# Patient Record
Sex: Female | Born: 1955 | Race: White | Hispanic: No | Marital: Married | Smoking: Never smoker
Health system: Southern US, Community
[De-identification: ages and names within clinical notes are randomized; demographics above are authoritative.]

## PROBLEM LIST (undated history)

## (undated) ENCOUNTER — Ambulatory Visit

## (undated) DIAGNOSIS — C801 Malignant (primary) neoplasm, unspecified: Secondary | ICD-10-CM

## (undated) DIAGNOSIS — E785 Hyperlipidemia, unspecified: Secondary | ICD-10-CM

## (undated) DIAGNOSIS — K5909 Other constipation: Secondary | ICD-10-CM

## (undated) DIAGNOSIS — O24419 Gestational diabetes mellitus in pregnancy, unspecified control: Secondary | ICD-10-CM

## (undated) DIAGNOSIS — J329 Chronic sinusitis, unspecified: Secondary | ICD-10-CM

## (undated) DIAGNOSIS — K219 Gastro-esophageal reflux disease without esophagitis: Secondary | ICD-10-CM

## (undated) DIAGNOSIS — R0789 Other chest pain: Secondary | ICD-10-CM

## (undated) DIAGNOSIS — K921 Melena: Secondary | ICD-10-CM

## (undated) DIAGNOSIS — I10 Essential (primary) hypertension: Secondary | ICD-10-CM

## (undated) DIAGNOSIS — E669 Obesity, unspecified: Secondary | ICD-10-CM

## (undated) DIAGNOSIS — J309 Allergic rhinitis, unspecified: Secondary | ICD-10-CM

## (undated) DIAGNOSIS — F329 Major depressive disorder, single episode, unspecified: Secondary | ICD-10-CM

## (undated) DIAGNOSIS — U071 COVID-19: Secondary | ICD-10-CM

## (undated) DIAGNOSIS — C4491 Basal cell carcinoma of skin, unspecified: Secondary | ICD-10-CM

## (undated) HISTORY — DX: Obesity, unspecified: E66.9

## (undated) HISTORY — PX: BREAST SURGERY: SHX581

## (undated) HISTORY — DX: Gastro-esophageal reflux disease without esophagitis: K21.9

## (undated) HISTORY — DX: COVID-19: U07.1

## (undated) HISTORY — PX: NEURECTOMY FOOT: SUR888

## (undated) HISTORY — PX: BUNIONECTOMY: SHX129

## (undated) HISTORY — DX: Basal cell carcinoma of skin, unspecified: C44.91

## (undated) HISTORY — DX: Melena: K92.1

## (undated) HISTORY — DX: Chronic sinusitis, unspecified: J32.9

## (undated) HISTORY — DX: Malignant (primary) neoplasm, unspecified: C80.1

## (undated) HISTORY — DX: Essential (primary) hypertension: I10

## (undated) HISTORY — DX: Other chest pain: R07.89

## (undated) HISTORY — DX: Other constipation: K59.09

## (undated) HISTORY — DX: Major depressive disorder, single episode, unspecified: F32.9

## (undated) HISTORY — DX: Allergic rhinitis, unspecified: J30.9

## (undated) HISTORY — PX: TUBAL LIGATION: SHX77

## (undated) HISTORY — DX: Hyperlipidemia, unspecified: E78.5

## (undated) HISTORY — PX: METATARSAL OSTEOTOMY: SHX1641

## (undated) HISTORY — DX: Gestational diabetes mellitus in pregnancy, unspecified control: O24.419

---

## 1994-02-19 HISTORY — PX: CYSTECTOMY: SUR359

## 2007-01-04 ENCOUNTER — Inpatient Hospital Stay (HOSPITAL_COMMUNITY): Admission: EM | Admit: 2007-01-04 | Discharge: 2007-01-07 | Payer: Self-pay | Admitting: Emergency Medicine

## 2007-01-04 ENCOUNTER — Encounter: Payer: Self-pay | Admitting: Family Medicine

## 2007-01-05 ENCOUNTER — Encounter (INDEPENDENT_AMBULATORY_CARE_PROVIDER_SITE_OTHER): Payer: Self-pay | Admitting: Internal Medicine

## 2007-08-15 ENCOUNTER — Encounter: Admission: RE | Admit: 2007-08-15 | Discharge: 2007-08-15 | Payer: Self-pay | Admitting: Family Medicine

## 2007-08-15 ENCOUNTER — Encounter: Payer: Self-pay | Admitting: Family Medicine

## 2008-05-10 ENCOUNTER — Encounter: Payer: Self-pay | Admitting: Family Medicine

## 2008-08-03 ENCOUNTER — Ambulatory Visit: Payer: Self-pay | Admitting: Family Medicine

## 2008-08-03 DIAGNOSIS — J309 Allergic rhinitis, unspecified: Secondary | ICD-10-CM | POA: Insufficient documentation

## 2008-08-03 DIAGNOSIS — I10 Essential (primary) hypertension: Secondary | ICD-10-CM

## 2008-08-03 DIAGNOSIS — K219 Gastro-esophageal reflux disease without esophagitis: Secondary | ICD-10-CM

## 2008-08-03 DIAGNOSIS — E785 Hyperlipidemia, unspecified: Secondary | ICD-10-CM | POA: Insufficient documentation

## 2008-08-03 HISTORY — DX: Allergic rhinitis, unspecified: J30.9

## 2008-08-03 HISTORY — DX: Essential (primary) hypertension: I10

## 2008-08-03 HISTORY — DX: Hyperlipidemia, unspecified: E78.5

## 2008-08-03 HISTORY — DX: Gastro-esophageal reflux disease without esophagitis: K21.9

## 2008-08-04 ENCOUNTER — Encounter: Payer: Self-pay | Admitting: Family Medicine

## 2008-10-12 ENCOUNTER — Ambulatory Visit: Payer: Self-pay | Admitting: Family Medicine

## 2008-10-12 DIAGNOSIS — R079 Chest pain, unspecified: Secondary | ICD-10-CM | POA: Insufficient documentation

## 2008-10-12 DIAGNOSIS — R0789 Other chest pain: Secondary | ICD-10-CM

## 2008-10-12 HISTORY — DX: Other chest pain: R07.89

## 2009-02-24 ENCOUNTER — Ambulatory Visit: Payer: Self-pay | Admitting: Family Medicine

## 2009-02-24 DIAGNOSIS — J329 Chronic sinusitis, unspecified: Secondary | ICD-10-CM | POA: Insufficient documentation

## 2009-02-24 HISTORY — DX: Chronic sinusitis, unspecified: J32.9

## 2009-03-16 ENCOUNTER — Telehealth: Payer: Self-pay | Admitting: Family Medicine

## 2009-05-02 ENCOUNTER — Telehealth: Payer: Self-pay | Admitting: Family Medicine

## 2009-05-02 DIAGNOSIS — K921 Melena: Secondary | ICD-10-CM

## 2009-05-02 HISTORY — DX: Melena: K92.1

## 2009-05-03 ENCOUNTER — Encounter (INDEPENDENT_AMBULATORY_CARE_PROVIDER_SITE_OTHER): Payer: Self-pay | Admitting: *Deleted

## 2009-07-25 ENCOUNTER — Telehealth: Payer: Self-pay | Admitting: Family Medicine

## 2009-07-28 ENCOUNTER — Ambulatory Visit: Payer: Self-pay | Admitting: Family Medicine

## 2009-07-29 LAB — CONVERTED CEMR LAB
AST: 14 units/L (ref 0–37)
Albumin: 4 g/dL (ref 3.5–5.2)
BUN: 13 mg/dL (ref 6–23)
Basophils Absolute: 0.1 10*3/uL (ref 0.0–0.1)
Basophils Relative: 1.4 % (ref 0.0–3.0)
Bilirubin, Direct: 0.1 mg/dL (ref 0.0–0.3)
Calcium: 9.7 mg/dL (ref 8.4–10.5)
Chloride: 107 meq/L (ref 96–112)
Creatinine, Ser: 1.1 mg/dL (ref 0.4–1.2)
Eosinophils Relative: 4.2 % (ref 0.0–5.0)
GFR calc non Af Amer: 55.95 mL/min (ref >60)
Glucose, Bld: 88 mg/dL (ref 70–99)
HCT: 40.9 % (ref 36.0–46.0)
Lymphs Abs: 1.7 10*3/uL (ref 0.7–4.0)
MCHC: 34.1 g/dL (ref 30.0–36.0)
Monocytes Absolute: 0.5 10*3/uL (ref 0.1–1.0)
Neutrophils Relative %: 53.2 % (ref 43.0–77.0)
RBC: 4.6 M/uL (ref 3.87–5.11)
RDW: 13.2 % (ref 11.5–14.6)
TSH: 2.48 microintl units/mL (ref 0.35–5.50)
Total Bilirubin: 1 mg/dL (ref 0.3–1.2)
Total CHOL/HDL Ratio: 5
Total Protein: 6.7 g/dL (ref 6.0–8.3)

## 2009-08-09 ENCOUNTER — Ambulatory Visit: Payer: Self-pay | Admitting: Family Medicine

## 2009-08-09 DIAGNOSIS — B084 Enteroviral vesicular stomatitis with exanthem: Secondary | ICD-10-CM | POA: Insufficient documentation

## 2009-12-08 ENCOUNTER — Other Ambulatory Visit: Admission: RE | Admit: 2009-12-08 | Discharge: 2009-12-08 | Payer: Self-pay | Admitting: Family Medicine

## 2009-12-08 ENCOUNTER — Ambulatory Visit: Payer: Self-pay | Admitting: Family Medicine

## 2009-12-08 DIAGNOSIS — K5909 Other constipation: Secondary | ICD-10-CM | POA: Insufficient documentation

## 2009-12-08 DIAGNOSIS — F3289 Other specified depressive episodes: Secondary | ICD-10-CM

## 2009-12-08 DIAGNOSIS — F329 Major depressive disorder, single episode, unspecified: Secondary | ICD-10-CM | POA: Insufficient documentation

## 2009-12-08 HISTORY — DX: Other constipation: K59.09

## 2009-12-08 HISTORY — DX: Major depressive disorder, single episode, unspecified: F32.9

## 2009-12-08 HISTORY — DX: Other specified depressive episodes: F32.89

## 2009-12-08 LAB — HM PAP SMEAR

## 2009-12-13 ENCOUNTER — Encounter: Payer: Self-pay | Admitting: Family Medicine

## 2010-02-10 ENCOUNTER — Ambulatory Visit: Payer: Self-pay | Admitting: Family Medicine

## 2010-03-12 ENCOUNTER — Encounter: Payer: Self-pay | Admitting: Family Medicine

## 2010-03-19 LAB — CONVERTED CEMR LAB: Pap Smear: NEGATIVE

## 2010-03-23 NOTE — Assessment & Plan Note (Signed)
Summary: PAP ONLY/PT STATED SHE DID NOT HER PAP IN 07-2009/NJR/pt rescd...   Vital Signs:  Patient profile:   55 year old female Menstrual status:  postmenopausal Weight:      154 pounds Temp:     98.3 degrees F oral BP sitting:   120 / 90  (left arm) Cuff size:   regular  Vitals Entered By: Sid Falcon LPN (December 08, 2009 3:00 PM)  History of Present Illness: patient here for the following several items.  She needs Pap smear. Remote history of abnormal Pap smear several years ago. Scheduled for mammogram later today.  She had planned on seeing gyn but has changed her mind.  History of depression which is stable on sertraline and needs refills. She has had some increased anxiety issues recently and very poor sleep. Previously on low-dose alprazolam and requesting refills which she takes very sporadically.  History of GERD controlled with omeprazole. Needs refills.  Frequent constipation for several months. Increased fiber intake ,water consumption,  and stool softener without much improvement. Does respond to laxatives. She had several years of constipation. No prior colonoscopy screening. She is hesitant because of cost issues.  thyroid normal last June.    Allergies: 1)  ! Sulfadiazine (Sulfadiazine) 2)  Pravachol (Pravastatin Sodium)  Past History:  Past Medical History: Last updated: 07/28/2009 Melanoma 2007 thigh GERD HypertensionMigraines, UTI Hyperlipidemia Allergic rhinitis  Past Surgical History: Last updated: 08/03/2008 Breast biopsy, 1985 Blood vessel leak in head 2008  Family History: Last updated: 08/03/2008 Family History High cholesterol, parent, grandparent Family History Hypertension, parent, grandparent family history stroke, grandparent family history diabetes, grandparent  Social History: Last updated: 08/03/2008 Occupation:  self employed Systems analyst course owner Married Never Smoked Alcohol use-no  Risk Factors: Smoking Status: never  (08/03/2008) PMH-FH-SH reviewed for relevance  Review of Systems  The patient denies anorexia, fever, weight loss, chest pain, syncope, dyspnea on exertion, peripheral edema, prolonged cough, headaches, hemoptysis, melena, hematochezia, severe indigestion/heartburn, and depression.    Physical Exam  General:  Well-developed,well-nourished,in no acute distress; alert,appropriate and cooperative throughout examination Ears:  External ear exam shows no significant lesions or deformities.  Otoscopic examination reveals clear canals, tympanic membranes are intact bilaterally without bulging, retraction, inflammation or discharge. Hearing is grossly normal bilaterally. Mouth:  Oral mucosa and oropharynx without lesions or exudates.  Teeth in good repair. Neck:  No deformities, masses, or tenderness noted. Breasts:  No mass, nodules, thickening, tenderness, bulging, retraction, inflamation, nipple discharge or skin changes noted.   Lungs:  Normal respiratory effort, chest expands symmetrically. Lungs are clear to auscultation, no crackles or wheezes. Heart:  normal rate and regular rhythm.   Abdomen:  soft and non-tender.   Genitalia:  Normal introitus for age, no external lesions, no vaginal discharge, mucosa pink and moist, no vaginal or cervical lesions, no vaginal atrophy, no friaility or hemorrhage, normal uterus size and position, no adnexal masses or tenderness Cervical Nodes:  No lymphadenopathy noted Psych:  normally interactive, good eye contact, not anxious appearing, and not depressed appearing.     Impression & Recommendations:  Problem # 1:  Preventive Health Care (ICD-V70.0) pap smear obtained.  mammogram later today.  hemoccults given.  She is reluctant to schedule colonoscopy at this time.  Problem # 2:  CONSTIPATION, CHRONIC (ICD-564.09) discussed lifestyle measures. She has continued to have difficulties for many years. She will try Amitiza 8 micrograms two times a day and  reviewed possible side effects.  Problem # 3:  GERD (ICD-530.81)  Her updated medication list for this problem includes:    Omeprazole 20 Mg Cpdr (Omeprazole) ..... One by mouth as needed  Problem # 4:  INSOMNIA, TRANSIENT (ICD-780.52) sleep hygiene discussed.  Low dose alprazolam for as needed use.  Problem # 5:  DEPRESSION (ICD-311)  Her updated medication list for this problem includes:    Zoloft 50 Mg Tabs (Sertraline hcl) ..... Once daily    Alprazolam 0.25 Mg Tabs (Alprazolam) ..... One by mouth at bedtime as needed  Complete Medication List: 1)  Zoloft 50 Mg Tabs (Sertraline hcl) .... Once daily 2)  Lisinopril 10 Mg Tabs (Lisinopril) .... Once daily 3)  Calcium Plus Vitamin D 300-100 Mg-unit Caps (Calcium carbonate-vitamin d) .... Once daily 4)  Rhinocort Aqua 32 Mcg/act Susp (Budesonide) .... One spray per nostil as needed 5)  Omeprazole 20 Mg Cpdr (Omeprazole) .... One by mouth as needed 6)  Fluconazole 150 Mg Tabs (Fluconazole) .... One tab by mouth as needed 7)  Diuretic Tabs (Buchu-cornsilk-ch grass-hydran) .... Every other day 8)  Vitamin B-12 100 Mcg Tabs (Cyanocobalamin) .... Once daily 9)  First-dukes Mouthwash Susp (Diphenhyd-hydrocort-nystatin) .... 5 cc by mouth 4 x daily prior to meals. swish and spit x 7 days 10)  Zovirax 400 Mg Tabs (Acyclovir) .Marland Kitchen.. 1 tab by mouth 5 x daily x 5 days 11)  Alprazolam 0.25 Mg Tabs (Alprazolam) .... One by mouth at bedtime as needed 12)  Amitiza 8 Mcg Caps (Lubiprostone) .... One by mouth two times a day  Other Orders: Pap Smear, Thin Prep ( Collection of) (G6440)  Patient Instructions: 1)  Schedule a colonoscopy/ sigmoidoscopy to help detect colon cancer.  Prescriptions: AMITIZA 8 MCG CAPS (LUBIPROSTONE) one by mouth two times a day  #60 x 1   Entered and Authorized by:   Evelena Peat MD   Signed by:   Evelena Peat MD on 12/08/2009   Method used:   Print then Give to Patient   RxID:   [id number] ALPRAZOLAM 0.25  MG TABS (ALPRAZOLAM) one by mouth at bedtime as needed  #30 x 1   Entered and Authorized by:   Evelena Peat MD   Signed by:   Evelena Peat MD on 12/08/2009   Method used:   Print then Give to Patient   RxID:   [id number] OMEPRAZOLE 20 MG CPDR (OMEPRAZOLE) one by mouth as needed  #30 x 11   Entered and Authorized by:   Evelena Peat MD   Signed by:   Evelena Peat MD on 12/08/2009   Method used:   Electronically to        [facility]  [address] (retail)       [address]       [place], [place]  [address]       Ph: [phone]       Fax: [phone]   RxID:   [id number] ZOLOFT 50 MG TABS (SERTRALINE HCL) once daily as needed  #30 x 11   Entered and Authorized by:   Evelena Peat MD   Signed by:   Evelena Peat MD on 12/08/2009   Method used:   Electronically to        [facility]  [address] (retail)       [address]       [place], [place]  [address]       Ph: [phone]  Fax: [phone]   RxID:   [id number]    Orders Added: 1)  Pap Smear, Thin Prep ( Collection of) [Q0091] 2)  Est. Patient Level IV [30865]

## 2010-03-23 NOTE — Letter (Signed)
Summary: Results Follow-up Letter  [place] [facility]  [address] [place], [place] [address]   Phone: [phone]  Fax: [phone]    12/13/2009  [address] [place], [place]  [address]  Dear Ms. Geng,   The following are the results of your recent test(s):  Test     Result     Pap Smear    Normal__X____  Not Normal_____       Comments: _________________________________________________________ Cholesterol LDL(Bad cholesterol):          Your goal is less than:         HDL (Good cholesterol):        Your goal is more than: _________________________________________________________ Other Tests:   _________________________________________________________  Please call for an appointment Or _________________________________________________________ _________________________________________________________ _________________________________________________________  Sincerely,  Evelena Peat, MD [place] [facility]

## 2010-03-23 NOTE — Assessment & Plan Note (Signed)
Summary: head congestion and sore throat/cjr---PT [facility] // RS   Vital Signs:  Patient profile:   55 year old female Menstrual status:  postmenopausal Weight:      159 pounds O2 Sat:      98 % Temp:     98.1 degrees F Pulse rate:   82 / minute BP sitting:   122 / 80  (left arm) Cuff size:   regular  Vitals Entered By: Pura Spice, RN (February 10, 2010 2:56 PM) CC: head congestion stuffy runny nose , URI symptoms   History of Present Illness:       This is a 55 year old woman who presents with URI symptoms.  one week hx of illness.  The patient complains of nasal congestion, clear nasal discharge, and productive cough, but denies sore throat.  Associated symptoms include low-grade fever (<100.5 degrees).  The patient also reports severe fatigue.  The patient denies headache and muscle aches.    Allergies: 1)  ! Sulfadiazine (Sulfadiazine) 2)  Pravachol (Pravastatin Sodium)  Review of Systems  The patient denies hoarseness, chest pain, syncope, dyspnea on exertion, peripheral edema, prolonged cough, and hemoptysis.    Physical Exam  General:  Well-developed,well-nourished,in no acute distress; alert,appropriate and cooperative throughout examination Ears:  External ear exam shows no significant lesions or deformities.  Otoscopic examination reveals clear canals, tympanic membranes are intact bilaterally without bulging, retraction, inflammation or discharge. Hearing is grossly normal bilaterally. Nose:  External nasal examination shows no deformity or inflammation. Nasal mucosa are pink and moist without lesions or exudates. Mouth:  Oral mucosa and oropharynx without lesions or exudates.  Teeth in good repair. Neck:  No deformities, masses, or tenderness noted. Lungs:  Normal respiratory effort, chest expands symmetrically. Lungs are clear to auscultation, no crackles or wheezes. Heart:  Normal rate and regular rhythm. S1 and S2 normal without gallop, murmur, click, rub or other  extra sounds.   Impression & Recommendations:  Problem # 1:  SINUSITIS, ACUTE (ICD-461.9)  Her updated medication list for this problem includes:    Rhinocort Aqua 32 Mcg/act Susp (Budesonide) ..... One spray per nostil as needed    Azithromycin 250 Mg Tabs (Azithromycin) .Marland Kitchen... 2 by mouth today then one by mouth once daily for 4 days  Complete Medication List: 1)  Zoloft 50 Mg Tabs (Sertraline hcl) .... Once daily 2)  Lisinopril 10 Mg Tabs (Lisinopril) .... Once daily 3)  Calcium Plus Vitamin D 300-100 Mg-unit Caps (Calcium carbonate-vitamin d) .... Once daily 4)  Rhinocort Aqua 32 Mcg/act Susp (Budesonide) .... One spray per nostil as needed 5)  Omeprazole 20 Mg Cpdr (Omeprazole) .... One by mouth as needed 6)  Fluconazole 150 Mg Tabs (Fluconazole) .... One tab by mouth as needed 7)  Diuretic Tabs (Buchu-cornsilk-ch grass-hydran) .... Every other day 8)  Vitamin B-12 100 Mcg Tabs (Cyanocobalamin) .... Once daily 9)  First-dukes Mouthwash Susp (Diphenhyd-hydrocort-nystatin) .... 5 cc by mouth 4 x daily prior to meals. swish and spit x 7 days 10)  Zovirax 400 Mg Tabs (Acyclovir) .Marland Kitchen.. 1 tab by mouth 5 x daily x 5 days 11)  Alprazolam 0.25 Mg Tabs (Alprazolam) .... One by mouth at bedtime as needed 12)  Amitiza 8 Mcg Caps (Lubiprostone) .... One by mouth two times a day 13)  Azithromycin 250 Mg Tabs (Azithromycin) .... 2 by mouth today then one by mouth once daily for 4 days  Patient Instructions: 1)  Acute sinusitis symptoms for less than 10 days are  not helped by antibiotics. Use warm moist compresses, and over the counter decongestants( only as directed). Call if no improvement in 5-7 days, sooner if increasing pain, fever, or new symptoms.  Prescriptions: AZITHROMYCIN 250 MG TABS (AZITHROMYCIN) 2 by mouth today then one by mouth once daily for 4 days  #6 x 0   Entered and Authorized by:   Evelena Peat MD   Signed by:   Evelena Peat MD on 02/10/2010   Method used:    Electronically to        [facility]  [address] (retail)       [address]       [place], [place]  [address]       Ph: [phone]       Fax: [phone]   RxID:   [id number]    Orders Added: 1)  Est. Patient Level III [32951]

## 2010-03-23 NOTE — Progress Notes (Signed)
Summary: needs rx for yeast, diflucan Rx  Phone Note Call from Patient Call back at Home Phone [phone]   Caller: Patient-live call Summary of Call: pt has a yeast infection coming from the Abx that She was put on 2 weeks ago. Please call in something to [facility] in [place]. Initial call taken by: Warnell Forester,  March 16, 2009 10:24 AM  Follow-up for Phone Call        OK to call in diflucan 150 mg by mouth times one dose. Follow-up by: Evelena Peat MD,  March 16, 2009 11:26 AM  Additional Follow-up for Phone Call Additional follow up Details #1::        Pt informed on personally identified VM. Additional Follow-up by: Sid Falcon LPN,  March 16, 2009 11:51 AM    New/Updated Medications: FLUCONAZOLE 150 MG TABS (FLUCONAZOLE) one tab by mouth one time Prescriptions: FLUCONAZOLE 150 MG TABS (FLUCONAZOLE) one tab by mouth one time  #1 x 0   Entered by:   Sid Falcon LPN   Authorized by:   Evelena Peat MD   Signed by:   Sid Falcon LPN on 86/57/8469   Method used:   Electronically to        [facility]  [address] (retail)       [address]       [place], [place]  [address]       Ph: [phone]       Fax: [phone]   RxID:   [id number]

## 2010-03-23 NOTE — Assessment & Plan Note (Signed)
Summary: SINUSES//CCM   Vital Signs:  Patient profile:   55 year old female Menstrual status:  postmenopausal Temp:     98.5 degrees F oral BP sitting:   120 / 80  (left arm) Cuff size:   regular  Vitals Entered By: Sid Falcon LPN (February 24, 2009 11:10 AM) CC: Sinusitis X 5-6 months   History of Present Illness: 5-6 months of left facial pain maxillary sinus region.  initially saw her dentist because of swelling left upper gum region but no dental abnormalities noted. She apparently had x-rays of her teeth which were unremarkable. Swelling left upper gum region seems to fluctuate somewhat. She has left facial pain and some chronic postnasal drip symptoms over the several months. No fever. Intermittent headache and malaise. No visible purulent drainage. No cough. Allergy to sulfa.  Allergies: 1)  ! Sulfadiazine (Sulfadiazine) 2)  Pravachol (Pravastatin Sodium)  Past History:  Past Medical History: Last updated: 08/03/2008 Cancer GERD HypertensionMigraines, UTI Hyperlipidemia Allergic rhinitis  Review of Systems      See HPI  Physical Exam  General:  Well-developed,well-nourished,in no acute distress; alert,appropriate and cooperative throughout examination Head:  Normocephalic and atraumatic without obvious abnormalities. No apparent alopecia or balding.tenderness of the left maxillary sinus region but no visible swelling Ears:  External ear exam shows no significant lesions or deformities.  Otoscopic examination reveals clear canals, tympanic membranes are intact bilaterally without bulging, retraction, inflammation or discharge. Hearing is grossly normal bilaterally. Nose:  no visible purulent drainage Mouth:  Oral mucosa and oropharynx without lesions or exudates.  Teeth in good repair. Neck:  No deformities, masses, or tenderness noted. Lungs:  Normal respiratory effort, chest expands symmetrically. Lungs are clear to auscultation, no crackles or wheezes. Heart:   Normal rate and regular rhythm. S1 and S2 normal without gallop, murmur, click, rub or other extra sounds.   Impression & Recommendations:  Problem # 1:  SINUSITIS, CHRONIC (ICD-473.9) 2 week course of Augmentin and limited CT of sinuses if no better in 2 weeks. Her updated medication list for this problem includes:    Rhinocort Aqua 32 Mcg/act Susp (Budesonide) ..... One spray per nostil once daily    Amoxicillin-pot Clavulanate 875-125 Mg Tabs (Amoxicillin-pot clavulanate) ..... One by mouth two times a day for 2 weeks.  Complete Medication List: 1)  Zoloft 50 Mg Tabs (Sertraline hcl) .... Once daily 2)  Lisinopril 10 Mg Tabs (Lisinopril) .... Once daily 3)  Calcium Plus Vitamin D 300-100 Mg-unit Caps (Calcium carbonate-vitamin d) .... Once daily 4)  Rhinocort Aqua 32 Mcg/act Susp (Budesonide) .... One spray per nostil once daily 5)  Omeprazole 20 Mg Cpdr (Omeprazole) .... One by mouth once daily 6)  Amoxicillin-pot Clavulanate 875-125 Mg Tabs (Amoxicillin-pot clavulanate) .... One by mouth two times a day for 2 weeks.  Patient Instructions: 1)  Drink lots of fluids. 2)  Call or touch base in 2 weeks if symptoms not improved. Prescriptions: AMOXICILLIN-POT CLAVULANATE 875-125 MG TABS (AMOXICILLIN-POT CLAVULANATE) one by mouth two times a day for 2 weeks.  #28 x 0   Entered and Authorized by:   Evelena Peat MD   Signed by:   Evelena Peat MD on 02/24/2009   Method used:   Electronically to        [facility]  [address] (retail)       [address]       [place], [place]  [address]  Ph: [phone]       Fax: [phone]   RxID:   [id number]

## 2010-03-23 NOTE — Progress Notes (Signed)
Summary: chole check  Phone Note Call from Patient Call back at Work Phone [phone]   Caller: Patient Call For: Evelena Peat MD Summary of Call: pt stated its time for chole check can I sch? Initial call taken by: Heron Sabins,  July 25, 2009 2:29 PM  Follow-up for Phone Call        [facility] to set up.  If pt is interested, set up CPE. Follow-up by: Evelena Peat MD,  July 25, 2009 5:27 PM  Additional Follow-up for Phone Call Additional follow up Details #1::        cpx 07-28-2009 9.30am fasting Additional Follow-up by: Heron Sabins,  July 26, 2009 10:57 AM

## 2010-03-23 NOTE — Assessment & Plan Note (Signed)
Summary: cpx/pt will come in fasting/ok per doc/njr   Vital Signs:  Patient profile:   55 year old female Menstrual status:  postmenopausal Height:      60.50 inches Weight:      154 pounds BMI:     29.69 Temp:     97.7 degrees F oral Pulse rate:   72 / minute Pulse rhythm:   regular Resp:     12 per minute BP sitting:   130 / 90  (left arm) Cuff size:   regular  Vitals Entered By: Sid Falcon LPN (July 29, 7827 9:33 AM)  Nutrition Counseling: Patient's BMI is greater than 25 and therefore counseled on weight management options. CC: CPX, pt fasting   History of Present Illness: Here for CPE.  No colonoscopy. Tetanus up to date. Has seen Dr Elana Alm in past for gyn exams.  Abnormal MRI head 2008 and we are trying to get records but she reports she had "blood vessel leak"  Sees gyn and plans to schedule soon. Hx melanoma and sees derm yearly for full body check. No regular exercise.  Allergies: 1)  ! Sulfadiazine (Sulfadiazine) 2)  Pravachol (Pravastatin Sodium)  Past History:  Past Surgical History: Last updated: 08/03/2008 Breast biopsy, 1985 Blood vessel leak in head 2008  Family History: Last updated: 08/03/2008 Family History High cholesterol, parent, grandparent Family History Hypertension, parent, grandparent family history stroke, grandparent family history diabetes, grandparent  Social History: Last updated: 08/03/2008 Occupation:  self employed Engineer, mining Married Never Smoked Alcohol use-no  Risk Factors: Smoking Status: never (08/03/2008)  Past Medical History: Melanoma 2007 thigh GERD HypertensionMigraines, UTI Hyperlipidemia Allergic rhinitis  Review of Systems  The patient denies anorexia, fever, weight loss, weight gain, vision loss, decreased hearing, hoarseness, chest pain, syncope, dyspnea on exertion, peripheral edema, prolonged cough, headaches, hemoptysis, abdominal pain, melena, hematochezia, severe  indigestion/heartburn, hematuria, incontinence, genital sores, muscle weakness, suspicious skin lesions, transient blindness, difficulty walking, depression, unusual weight change, abnormal bleeding, enlarged lymph nodes, and breast masses.         freq constipation which is chronic.  one episode of hematochezia 3/11 and none since.  Physical Exam  General:  Well-developed,well-nourished,in no acute distress; alert,appropriate and cooperative throughout examination Head:  Normocephalic and atraumatic without obvious abnormalities. No apparent alopecia or balding. Eyes:  No corneal or conjunctival inflammation noted. EOMI. Perrla. Funduscopic exam benign, without hemorrhages, exudates or papilledema. Vision grossly normal. Ears:  External ear exam shows no significant lesions or deformities.  Otoscopic examination reveals clear canals, tympanic membranes are intact bilaterally without bulging, retraction, inflammation or discharge. Hearing is grossly normal bilaterally. Mouth:  Oral mucosa and oropharynx without lesions or exudates.  Teeth in good repair. Neck:  No deformities, masses, or tenderness noted. Breasts:  per gyn Lungs:  Normal respiratory effort, chest expands symmetrically. Lungs are clear to auscultation, no crackles or wheezes. Heart:  Normal rate and regular rhythm. S1 and S2 normal without gallop, murmur, click, rub or other extra sounds. Abdomen:  Bowel sounds positive,abdomen soft and non-tender without masses, organomegaly or hernias noted. Genitalia:  per gyn Msk:  No deformity or scoliosis noted of thoracic or lumbar spine.   Extremities:  No clubbing, cyanosis, edema, or deformity noted with normal full range of motion of all joints.   Neurologic:  alert & oriented X3, cranial nerves II-XII intact, and strength normal in all extremities.   Skin:  multiple nevi, esp back no signif atypia. Cervical Nodes:  No lymphadenopathy noted  Psych:  Cognition and judgment appear  intact. Alert and cooperative with normal attention span and concentration. No apparent delusions, illusions, hallucinations   Impression & Recommendations:  Problem # 1:  Preventive Health Care (ICD-V70.0) needs colonoscopy and pt highly advised to schedule.  she has high co-pay and has been reluctant to scheduale in past.She will call when she is ready.  Given hemoccult cards for completion.  Check screening labs.    Complete Medication List: 1)  Zoloft 50 Mg Tabs (Sertraline hcl) .... Once daily as needed 2)  Lisinopril 10 Mg Tabs (Lisinopril) .... Once daily 3)  Calcium Plus Vitamin D 300-100 Mg-unit Caps (Calcium carbonate-vitamin d) .... Once daily 4)  Rhinocort Aqua 32 Mcg/act Susp (Budesonide) .... One spray per nostil as needed 5)  Omeprazole 20 Mg Cpdr (Omeprazole) .... One by mouth as needed 6)  Fluconazole 150 Mg Tabs (Fluconazole) .... One tab by mouth as needed 7)  Diuretic Tabs (Buchu-cornsilk-ch grass-hydran) .... Every other day 8)  Vitamin B-12 100 Mcg Tabs (Cyanocobalamin) .... Once daily  Other Orders: Venipuncture ([address]) TLB-Lipid Panel (80061-LIPID) TLB-BMP (Basic Metabolic Panel-BMET) (80048-METABOL) TLB-CBC Platelet - w/Differential (85025-CBCD) TLB-Hepatic/Liver Function Pnl (80076-HEPATIC) TLB-TSH (Thyroid Stimulating Hormone) (84443-TSH)  Patient Instructions: 1)  It is important that you exercise reguarly at least 20 minutes 5 times a week. If you develop chest pain, have severe difficulty breathing, or feel very tired, stop exercising immediately and seek medical attention.  2)  Schedule your mammogram.  3)  Schedule a colonoscopy/ sigmoidoscopy to help detect colon cancer.  4)  Take calcium +vitamin D daily.  Prescriptions: LISINOPRIL 10 MG TABS (LISINOPRIL) once daily  #90 x 3   Entered and Authorized by:   Evelena Peat MD   Signed by:   Evelena Peat MD on 07/28/2009   Method used:   Electronically to        [facility]  [address] (retail)        [address]       [place], [place]  [address]       Ph: [phone]       Fax: [phone]   RxID:   [id number]    Preventive Care Screening  Last Tetanus Booster:    Date:  07/24/2006    Results:  Tdap

## 2010-03-23 NOTE — Progress Notes (Signed)
Summary: COLON REFERRAL  Phone Note Call from Patient Call back at Work Phone [phone]   Caller: Patient Call For: Evelena Peat MD Summary of Call: PT WAS HAVE RECTAL BLEEDING YESTERDAY, PT WOULD LIKE A REFERRAL FOR COLONOSCOPY, SHE IS REQUESTING THUR Initial call taken by: Heron Sabins,  May 02, 2009 10:38 AM  Follow-up for Phone Call        agree that she should have evaluated further and esp at her age as she has never had prior screening colonoscopy.  Will refer to GI. Follow-up by: Evelena Peat MD,  May 02, 2009 12:56 PM  Additional Follow-up for Phone Call Additional follow up Details #1::        Appt Scheduled. Pt aware. Additional Follow-up by: Corky Mull,  May 03, 2009 9:29 AM  New Problems: HEMATOCHEZIA (ICD-578.1)   New Problems: HEMATOCHEZIA (ICD-578.1)

## 2010-03-23 NOTE — Assessment & Plan Note (Signed)
Summary: mouth lesions/dm   Vital Signs:  Patient profile:   55 year old female Menstrual status:  postmenopausal Weight:      154 pounds O2 Sat:      91 % Temp:     98.3 degrees F oral Pulse rate:   76 / minute BP sitting:   112 / 90  (right arm) Cuff size:   regular  Vitals Entered By: Kathrynn Speed CMA (August 09, 2009 1:53 PM) CC: Mouth lesions staeted a week ago, mouth soreness, red gums, blisters in roof of mouth   History of Present Illness: Patient in today with 5 day history of oral lesions. She has had cold sores for years on her lips but never any lesions like this. Initially her tongue became sore and felt swollen along edges, then she developed blisters on the roof of her mouth and a sore throat. Over past 24 hours she has also noted red/swollen/painful gums. She denies f/c/cough/CP/palp/SOB/myalgias/GI c/o. She has a long history of nasal allergies and takes Zyrtec intermittently but these symptoms have not recently worsened. She has ongoing nasal congestion/pruritus and did have a couple of episodes of R sided epistaxis last week which were easily controlled and not unusual for her. 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  Current Medications (verified): 1)  Zoloft 50 Mg Tabs (Sertraline Hcl) .... Once Daily As Needed 2)  Lisinopril 10 Mg Tabs (Lisinopril) .... Once Daily 3)  Calcium Plus Vitamin D 300-100 Mg-Unit Caps (Calcium Carbonate-Vitamin D) .... Once Daily 4)  Rhinocort Aqua 32 Mcg/act Susp (Budesonide) .... One Spray Per Nostil As Needed 5)  Omeprazole 20 Mg Cpdr (Omeprazole) .... One By Mouth As Needed 6)  Fluconazole 150 Mg Tabs (Fluconazole) .... One Tab By Mouth As Needed 7)  Diuretic  Tabs (Buchu-Cornsilk-Ch Grass-Hydran) .... Every Other Day 8)  Vitamin B-12 100 Mcg Tabs (Cyanocobalamin) .... Once Daily  Allergies (verified): 1)  ! Sulfadiazine (Sulfadiazine) 2)  Pravachol (Pravastatin  Sodium)  Past History:  Past medical history reviewed for relevance to current acute and chronic problems. Social history (including risk factors) reviewed for relevance to current acute and chronic problems.  Past Medical History: Reviewed history from 07/28/2009 and no changes required. Melanoma 2007 thigh GERD HypertensionMigraines, UTI Hyperlipidemia Allergic rhinitis  Social History: Reviewed history from 08/03/2008 and no changes required. Occupation:  self employed Engineer, mining Married Never Smoked Alcohol use-no  Review of Systems      See HPI  Physical Exam  General:  Well-developed,well-nourished,in no acute distress; alert,appropriate and cooperative throughout examination Head:  Normocephalic and atraumatic without obvious abnormalities Eyes:  No corneal or conjunctival inflammation noted. EOMI. Perrla. Ears:  R TM sclerotic.   Nose:  mucosal erythema and mucosal edema.   Mouth:  pharyngeal erythema, gingival inflammation, and vesicle(s) on roof of mouth and in posterior oropharynx   Neck:  No deformities, masses, or tenderness noted. Lungs:  Normal respiratory effort, chest expands symmetrically. Lungs are clear to auscultation, no crackles or wheezes. Heart:  Normal rate and regular rhythm. S1 and S2 normal without gallop, murmur, click, rub or other extra sounds. Abdomen:  Bowel sounds positive,abdomen soft and no  masses, organomegaly or hernias noted.normal bowel sounds, no guarding, no rigidity, no rebound tenderness, epigastric tenderness, and LUQ mass.   Pulses:  R and L dorsalis pedis and posterior tibial pulses are full and equal bilaterally Extremities:  No clubbing, cyanosis, edema,  or deformity noted with normal full range of motion of all joints.   Neurologic:  No cranial nerve deficits noted. Station and gait are normal. Plantar reflexes are down-going bilaterally. DTRs are symmetrical throughout. Sensory, motor and coordinative functions appear  intact. Psych:  Cognition and judgment appear intact. Alert and cooperative with normal attention span and concentration. No apparent delusions, illusions, hallucinations   Impression & Recommendations:  Problem # 1:  HAND, FOOT, AND MOUTH DISEASE (ICD-074.3) Viral lesions in mouth suspicious for Coxsackie virus, does have a small grand daughter whom she cares for and picks up from daycare. Given Duke's Magic Mouthwash to use as needed for pain QID, swish and spit 5cc. Acyclovir 400mg  by mouth 5 x daily x 5days. Report if symptoms worsen or do not improve.  Problem # 2:  ALLERGIC RHINITIS (ICD-477.9)  Her updated medication list for this problem includes:    Rhinocort Aqua 32 Mcg/act Susp (Budesonide) ..... One spray per nostil as needed Consider taking Zyrtec daily during golf season  Problem # 3:  HYPERTENSION (ICD-401.9)  Her updated medication list for this problem includes:    Lisinopril 10 Mg Tabs (Lisinopril) ..... Once daily    Diuretic Tabs (Buchu-cornsilk-ch grass-hydran) ..... Every other day Well controlled on current meds.  Complete Medication List: 1)  Zoloft 50 Mg Tabs (Sertraline hcl) .... Once daily as needed 2)  Lisinopril 10 Mg Tabs (Lisinopril) .... Once daily 3)  Calcium Plus Vitamin D 300-100 Mg-unit Caps (Calcium carbonate-vitamin d) .... Once daily 4)  Rhinocort Aqua 32 Mcg/act Susp (Budesonide) .... One spray per nostil as needed 5)  Omeprazole 20 Mg Cpdr (Omeprazole) .... One by mouth as needed 6)  Fluconazole 150 Mg Tabs (Fluconazole) .... One tab by mouth as needed 7)  Diuretic Tabs (Buchu-cornsilk-ch grass-hydran) .... Every other day 8)  Vitamin B-12 100 Mcg Tabs (Cyanocobalamin) .... Once daily 9)  First-dukes Mouthwash Susp (Diphenhyd-hydrocort-nystatin) .... 5 cc by mouth 4 x daily prior to meals. swish and spit x 7 days 10)  Zovirax 400 Mg Tabs (Acyclovir) .Marland Kitchen.. 1 tab by mouth 5 x daily x 5 days  Patient Instructions: 1)  Please schedule a follow-up  appointment as needed if lesions worsen or do not resolve. Take Acyclovir til gone. Use Duke's Mouthwash as needed for pain prior to eating. Run toothbrushes thru the dishwasher or replace, now and after course of medications is complete Prescriptions: ZOVIRAX 400 MG TABS (ACYCLOVIR) 1 tab by mouth 5 x daily x 5 days  #25 x 0   Entered and Authorized by:   Danise Edge MD   Signed by:   Danise Edge MD on 08/09/2009   Method used:   Electronically to        [facility] [facility] (retail)       [address]       [place], [place]  [address]       Ph: [phone]       Fax: [phone]   RxID:   [id number] FIRST-DUKES MOUTHWASH  SUSP (DIPHENHYD-HYDROCORT-NYSTATIN) 5 cc by mouth 4 x daily prior to meals. Swish and spit x 7 days  #150 x 0   Entered and Authorized by:   Danise Edge MD   Signed by:   Danise Edge MD on 08/09/2009   Method used:   Electronically to        [facility]  [address] [facility] (retail)  [address]       [place], [place]  [address]       Ph: [phone]       Fax: [phone]   RxID:   [id number]

## 2010-03-23 NOTE — Letter (Signed)
Summary: New Patient letter  [facility]  [address] [place], [place] [address]   Phone: [phone]  Fax: [phone]       05/03/2009 MRN: [id number]  Debbie Werner [address] [place], [place]  [address]  Dear Ms. Stroud,  Welcome to the Gastroenterology Division at [facility].    You are scheduled to see Dr.  Leone Payor on 06-02-09 at 9am on the 3rd floor at [facility], [address]  We ask that you try to arrive at our office 15 minutes prior to your appointment time to allow for check-in.  We would like you to complete the enclosed self-administered evaluation form prior to your visit and bring it with you on the day of your appointment.  We will review it with you.  Also, please bring a complete list of all your medications or, if you prefer, bring the medication bottles and we will list them.  Please bring your insurance card so that we may make a copy of it.  If your insurance requires a referral to see a specialist, please bring your referral form from your primary care physician.  Co-payments are due at the time of your visit and may be paid by cash, check or credit card.     Your office visit will consist of a consult with your physician (includes a physical exam), any laboratory testing he/she may order, scheduling of any necessary diagnostic testing (e.g. x-ray, ultrasound, CT-scan), and scheduling of a procedure (e.g. Endoscopy, Colonoscopy) if required.  Please allow enough time on your schedule to allow for any/all of these possibilities.    If you cannot keep your appointment, please call [phone] to cancel or reschedule prior to your appointment date.  This allows [place] the opportunity to schedule an appointment for another patient in need of care.  If you do not cancel or reschedule by 5 p.m. the business day prior to your appointment date, you will be charged a $50.00 late cancellation/no-show fee.    Thank you for choosing [facility] for your medical needs.  We appreciate the opportunity to care for you.  Please visit [place] at our website  to learn more about our practice.                     Sincerely,                                                             The Gastroenterology Division

## 2010-06-26 ENCOUNTER — Other Ambulatory Visit: Payer: Self-pay | Admitting: Family Medicine

## 2010-06-26 NOTE — Telephone Encounter (Signed)
Ambien refill request sent electronically, last filled 12/08/09 #30 with 1 refill, may take one at HS as needed

## 2010-06-26 NOTE — Telephone Encounter (Signed)
OK to refill for 6 months 

## 2010-07-04 NOTE — Discharge Summary (Signed)
Debbie Werner, Debbie Werner               ACCOUNT NO.:  [id number]   MEDICAL RECORD NO.:  [id number]          PATIENT TYPE:  INP   LOCATION:  3728                         FACILITY:  [facility]   PHYSICIAN:  Altha Harm, MDDATE OF BIRTH:  1955-03-30   DATE OF ADMISSION:  01/04/2007  DATE OF DISCHARGE:  01/07/2007                               DISCHARGE SUMMARY   DISCHARGE DISPOSITION:  Home.   FINAL DISCHARGE DIAGNOSES:  1. Hypertensive crisis, resolved.  2. Hypertension.  3. Hyperlipidemia, new diagnosis.  4. Elevated thyroid-stimulating hormone, diagnosis unclear.  5. Intractable headaches, resolved.  6. Intractable vomiting, resolved.  7. Positive blood culture, gram-positive cocci, felt to be a      contaminant, need to be followed up by primary care physician.   DISCHARGE MEDICATIONS:  1. Labetalol 100 mg p.o. b.i.d.  2. Zocor 40 mg p.o. q.h.s.  3. Fioricet 1 tablet p.o. q.4h. p.r.n.   CONSULTANTS:  Dr. Nash Shearer, neurology.   PROCEDURES:  None.   DIAGNOSTIC STUDIES:  1. MRI of the brain with and without contrast done on admission on      January 04, 2007 which shows that the radiodense structure noted      in the right temporal lobe on the outside CT from [facility]      Radiology appears to represent a benign calcification.  There is no      evidence of vascular malformation, infection, calcified neoplasm,      or acute hemorrhage.  No acute stroke or abnormal intracranial      enhancement.  Two small calvarial lesions as described in the right      parietal and left frontal bones with P1 shortening pre-imposed      contrast correlating with small lucencies on the CT.  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  2. Portable chest x-ray done on January 04, 2007.  This shows no      evidence of acute cardiopulmonary disease.   PERTINENT LABORATORY  STUDIES:  1. Blood culture which shows gram-positive cocci in 1 out of 2 bottles      (given the patient's clinical course, it is felt that this is a      contaminant).  That needs to be followed by her primary care      physician for final results.  2. Mildly elevated TSH at 5.8 (the patient has T3 and free T4 within      the normal range).  Recommend rechecking TSH when the patient is in      a convalescent stage.   CHIEF COMPLAINT:  Near syncope and headache and vomiting.   HISTORY OF PRESENT ILLNESS:  Please see H&P dictated by Dr. Ashley Royalty for  details of the HPI.  However, briefly, this is a patient who had a near  syncopal episode on Friday morning.  She was seen by her primary care  physician at their office and sent to have  a radiological study done.  The CT of the head indicated a possibility of an intracranial  hemorrhage, and the patient was sent to the emergency room for a  followup MRI to confirm.  The MRI results are as noted above.  While in  the emergency room, the patient was noted to have an excruciating  headache and intractable vomiting.  She was also noted to have a  markedly elevated blood pressure.  Neurology was consulted for the  possibility of intracranial bleed and the headaches, and we were asked  to admit the patient for the hypertensive urgency.   HOSPITAL COURSE:  1. Hypertensive urgency.  The patient's blood pressure was elevated to      the 200s over 110s.  The patient was placed on a nitroglycerin drip      and given intermittent IV labetalol as she was unable to tolerate      p.o.  I felt that her blood pressure was multifactorial as a      combination of essential hypertension in conjunction with a high-      stress state due to her vomiting and headache.  The patient's      headache was treated, and once this was adequately treated the      patient actually had quite a nice resolution of her hypertensive      urgency.  The patient was placed on  labetalol 100 mg b.i.d.  Given      the patient's response to medications, it is quite possible that      the patient may be able to taper her medications and be treated      with a very small dose of antihypertensive medication.  I have      instructed the patient to check her blood pressure first thing in      the morning and at approximately 2 p.m. in the afternoon for an      entire week and to present these findings to her physician in the      ambulatory setting for further evaluation.  This patient should      also be watched for episodic spikes in high blood pressure which      may be an indication of a pheochromocytoma given the fact that the      patient had such an excruciating headache associated with it.      However, at this juncture, no studies have been done to evaluate      the pheochromocytoma.  2. Hyperlipidemia.  The patient was found to have profoundly elevated      cholesterols with her total cholesterol equaling 255, her HDL 39,      her LDL 190, and her triglycerides at 128.  The patient does have a      strong family history of hyperlipidemia with both her mother and      brother being on antilipidemic medications.  The patient has just      started on Zocor 40 mg.  I have spoken to the patient and the      family about the need to have her cholesterol panel rechecked in      approximately 4 weeks for further titration of the medication.      This should be done on the ambulatory setting.  3. Elevated TSH.  The patient had elevated TSH.  Initial TSH was      mildly elevated at 5.72.  A repeat TSH was 5.87 with free T4  within      the normal range of 1.49 and a T3 level in the normal range of 3.2.      Given the fact that the patient had such an intense intracranial      process which may have caused her to have a transient protective      hypothyroidism, I would recommend that the patient have these      studies repeated in the convalescence stage in the  ambulatory      setting, and I would, more than likely, just repeat a TSH.  It is      quite likely that the patient, even though her T3 and free T4 are      within the normal range, she could have a decrease in her usual set      point which would cause an increase in the TSH.  I would recommend      a TSH being repeated in approximately 2-3 weeks and therapy      instituted if necessary at that time, particularly in light of the      patient's marked hyperlipidemic state.  4. Headaches.  The patient's headaches resolved after she received      Decadron and remained in resolution after the Decadron was      discontinued.  The patient did not require any narcotics for the      resolution of her headache.  In contrast, the patient has received      Dilaudid and had a very aggressive response in terms of emesis to      the Dilaudid.  The patient has been given Fioricet to be used on a      p.r.n. basis, but I have instructed the patient to use other      medications, including NSAIDs and Tylenol, as a first line of      treating her headaches before using Fioricet.  Patient had findings      on her MRI which could indicate a melanotic spread, and the      recommendation is for the patient to have a repeat head study in      approximately 3 months for followup.   CONDITION AT THE TIME OF DISCHARGE:  Stable.  She is afebrile, blood  pressure is in the systolics of 120s over 80s, heart rate in the 70s.  The patient is without any headaches.  She is able to tolerate her diet  well.  She is able to ambulate without any difficulty.   DIETARY RESTRICTIONS:  She should be on a low-cholesterol heart healthy  diet.   PHYSICAL RESTRICTIONS:  None.   FOLLOWUP:  She is to follow up with [facility] in 1  week.      Altha Harm, MD  Electronically Signed     MAM/MEDQ  D:  01/07/2007  T:  01/07/2007  Job:  [id number]   cc:   Bevelyn Buckles. Nash Shearer, M.D.

## 2010-07-04 NOTE — Consult Note (Signed)
NAMEALEISHA, PAONE               ACCOUNT NO.:  [id number]   MEDICAL RECORD NO.:  [id number]          PATIENT TYPE:  EMS   LOCATION:  MAJO                         FACILITY:  [facility]   PHYSICIAN:  Bevelyn Buckles. Champey, M.D.DATE OF BIRTH:  February 06, 1956   DATE OF CONSULTATION:  01/04/2007  DATE OF DISCHARGE:                                 CONSULTATION   REQUESTING PHYSICIAN:  Dr. Deretha Emory.   REASON FOR CONSULTATION:  Headache and syncope.   HISTORY OF PRESENT ILLNESS:  Ms. Fletchall is a 55 year old Caucasian  female with past medical history of hypertension who is not currently on  any medications, who presents with a 1- to 2-week history of headaches.  The patient has a history of migraines that stopped a few years ago and  has a history of head trauma during childhood.  The patient presented  with gradual onset of left posterolateral headache as she described as  sharp 10/10 intensity without any radiation for the past couple of  weeks.  Her headaches worsened with any movements and she occasionally  had nausea with these headaches.  She denied any photophobia or  phonophobia with these headaches.  For the past month, she has also been  complaining of hot flashes and intermittent lightheadedness including  this morning.  Today she had a lightheaded episode with a room spinning  sensation and actually lost consciousness for a few seconds.  There was  no shaking, incontinence or tongue biting.  The patient felt tired  afterwards and has slight lightheadedness.  She was normal within the  few hours.  She denies any focal weakness, vision changes, numbness,  speech changes, swallowing problems, chewing problems, or balance  problems.  Outside CT scan showed a left temporal hyperdensity,  questionable intracerebral hemorrhage.  The patient was brought to  emergency room for further evaluation.   PAST MEDICAL HISTORY:  Positive for hypertension, not on any  medications.   CURRENT  MEDICATIONS:  Xanax p.r.n.   ALLERGIES:  NO KNOWN DRUG ALLERGIES.   FAMILY HISTORY:  Positive for hypertension, heart disease and diabetes.   SOCIAL HISTORY:  The patient lives with her husband.  She denies any  alcohol, tobacco or drug use.   REVIEW OF SYSTEMS:  Positive as per HPI.  Review of systems negative as  per HPI in greater than 7 other organ systems.   EXAMINATION:  VITALS:  Temperature is 96.7, blood pressure is 180/118,  pulse is 82, respirations 16 and O2 SAT is 100%.  HEENT:  Normocephalic, atraumatic.  Extraocular muscles are intact.  Pupils are equal, round and reactive to light.  NECK:  Supple.  No carotid bruits.  HEART:  Regular.  LUNGS:  Clear.  ABDOMEN:  Soft and nontender.  EXTREMITIES:  Show good pulses.  NEUROLOGICAL:  The patient is awake, alert and oriented.  Language is  fluent.  Cranial nerves II-XII are grossly intact.  Motor examination  shows good strength and normal tone in all 4 extremities.  No drift is  noted.  Sensory examination is within normal limits to light touch.  Reflexes are 1 to  2+.  Cerebellar function is within normal limits.  Finger-to-nose and gait were not assessed secondary to safety.   LABORATORY DATA:  WBC is 8.9, hemoglobin 14.9, hematocrit is 44.5 and  platelets 346,000.  Sodium is 138, potassium is 4.1, chloride is 108,  CO2 is 25, BUN 11, creatinine is 1.1, glucose 96.  PT is 13.4, INR is  1.0 and PTT is 29.   IMAGING STUDY:  CT of the head showed a left temporal hyperintensity,  questionable intracerebral hemorrhage.   MRI of the brain showed no acute abnormalities with no acute bleeding.  The lesion on the CT scan is suggestive of a calcification.  No tumors  or masses are noted.   IMPRESSION:  This is a 55 year old Caucasian female with headaches,  elevated blood pressure and syncope.  I reviewed the MRI scan with  Radiology and Neurosurgery and no masses, acute strokes or bleeds are  present; it seems like  calcification, but was seen on CT scan.  The  patient needs evaluation for elevated blood pressure and syncope.  I  would recommend checking an EEG to rule out seizure activity; I would  also check carotid Dopplers and 2-D echocardiogram.  I would definitely  treat the patient's blood pressure, keeping her systolic blood pressure  less than 180 and diastolic blood pressure less than 100.  I will place  the patient on telemetry as well.  We will follow the patient as  consultants.      Bevelyn Buckles. Nash Shearer, M.D.  Electronically Signed     DRC/MEDQ  D:  01/04/2007  T:  01/06/2007  Job:  [id number]

## 2010-07-04 NOTE — H&P (Signed)
Debbie Werner, Debbie Werner NO.:  [id number]   MEDICAL RECORD NO.:  [id number]          PATIENT TYPE:  EMS   LOCATION:  MAJO                         FACILITY:  [facility]   PHYSICIAN:  Altha Harm, MDDATE OF BIRTH:  September 15, 1955   DATE OF ADMISSION:  01/04/2007  DATE OF DISCHARGE:                              HISTORY & PHYSICAL   CHIEF COMPLAINT:  Headache and near-syncope.   HISTORY OF PRESENT ILLNESS:  This is a 55 year old Caucasian female with  a history of hypertension, who presents to the emergency room with  complaints of one to two weeks of headache.  The patient does have a  history of migraines.  In addition, she has a history of hypertension,  which was diagnosed approximately two months ago.  The patient states  that she was started on an antihypertensive which caused her to have  cramping, and this was discontinued by her doctor.  The patient is  supposed to go back to the doctor for further management of her  hypertension, but never returned for follow-up care.  She states in the  last week and a half, she was having the headaches and was seen by her  doctor who treated her for sinusitis.  She states the headaches did not  resolve.  She describes the headaches as occurring in the occipital  region and feeling like a warmness going down into her cervical spinal  area.  She describes the headache as a 10/10, non-radiating, associated  with nausea with no vomiting.  She denies any blurring of vision, and  she denies any photophobia.  She denies any neck stiffness.  She denies  any dizziness, seizure activity or loss of consciousness.  The patient  states that today, while at work, the headache grew unbearable, and she  actually fell backwards and then went to the floor.  She states that  during the time of this episode, she had no loss of consciousness.  This  patient states that she knew everything that was occurring around her  and says she was in a state  of fainting.  The patient was seen at her  primary care physician's office, and a CT of her head performed.  At  that time, the CT of the head was read as bleed, and that she was sent  here to the emergency room for further evaluation.  MRI of her brain was  done and evaluated by neurosurgery, who determined that it was rather  calcifications and not indeed a bleed to her brain.  Neurology was  consulted who fely that the patient needed to be ruled out for seizures  with an EEG.   PAST MEDICAL HISTORY:  1. Significant as noted for hypertension.  2. Migraines.   FAMILY HISTORY:  Significant for coronary artery disease and  hypertension in the father.   SOCIAL HISTORY:  The patient resides with her husband, and together they  were on a golf course.  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  She denies any tobacco, alcohol or  drug use.   ALLERGIES:  NO KNOWN DRUG ALLERGIES.   MEDICATIONS:  None.   PRIMARY CARE PHYSICIAN:  [facility].   REVIEW OF SYSTEMS:  Fourteen systems were reviewed.  All systems were  negative, except as noted in the HPI.   STUDIES:  Studies done in the emergency room were as follows:  Sodium  138, potassium 4.1, chloride 108, bicarb 25, BUN 11, creatinine 1.1,  glucose 76, white blood cell count 8.9, hemoglobin 14.9, hematocrit  44.5, platelets 346.   MRI of the brain shows calcifications as seen on CT.   PHYSICAL EXAMINATION:  VITAL SIGNS:  The patient initially arrived to  the emergency room and was noted to have a blood pressure markedly  elevated at 180/118.  The patient was started on a Tridil drip.  Her  blood pressure now is 186/103.  Heart rate 75, respiratory rate 14,  pulse ox 100% on room air.  HEENT:  She is normocephalic, atraumatic.  Pupils are equally round and  reactive to light and accommodation.  Fundi are benign.  Tympanic  membranes are translucent bilaterally with good  landmarks.  Oropharynx  is moist.  No exudates, erythema or lesions are noted.  NECK:  Trachea is midline, no masses, no thyromegaly, no JVD, no carotid  bruits.  RESPIRATORY:  The patient has a normal respiratory effort, equal  excursion bilaterally.  No wheezing, rhonchi or rales noted.  CARDIOVASCULAR:  She has got a normal S1 and S2.  No murmurs, rubs or  gallops are noted.  PMI is nondisplaced.  No heaves or thrills on  palpation.  ABDOMEN:  Abdomen is obese, soft, nontender, nondistended.  No masses,  no hepatosplenomegaly noted.  MUSCULOSKELETAL:  The patient has no more swelling or erythema around  the joints.  NEUROLOGIC:  Gait is deferred secondary to the patient's current  condition.  DTR's are 2+ bilaterally upper and lower extremities.  Cranial nerves 2-12 are grossly intact.  No focal neurological deficits  noted.  The patient is able to move all 4 extremities against gravity,  although secondary to her cephalalgia, she is unable to participate in  the form of examination.  PSYCHIATRIC:  She is alert and oriented x3.  She has got good insight  and cognition and good recent to remote recall.  LYMPH NODE SURVEY:  She has got no cervical, axillary or inguinal  lymphadenopathy noted.   ASSESSMENT/PLAN:  This is a patient who presents with:  1. Hypertensive urgency.  2. Cephalalgia.  3. Near-syncope.   My feeling is that this could quite likely all be related to her  hypertensive urgency which could explain all of her symptoms.  The  patient will be placed on Tridil drip and titrating up to lower her  blood pressure less than 180/100.  We will also institute Labetalol for  this patient.  Currently, this patient is nauseated and is unable to  accept anything but p.o., thus I will start with labetalol IV.  I will  also give her some Ativan to help both her headache and her nausea and  vomiting.  We will convert her to p.o. labetalol, along with  hydrochlorothiazide.  I  will check a 2D echo on the patient and  neurology will follow along with the recommendations for the headache.      Altha Harm, MD  Electronically Signed     MAM/MEDQ  D:  01/04/2007  T:  01/04/2007  Job:  [id number]

## 2010-07-04 NOTE — Consult Note (Signed)
Werner, Debbie               ACCOUNT NO.:  [id number]   MEDICAL RECORD NO.:  [id number]          PATIENT TYPE:  EMS   LOCATION:  [facility]JO                         FACILITY:  [facility]   PHYSICIAN:  Coletta Memos, M.D.     DATE OF BIRTH:  [birth year]   DATE OF CONSULTATION:  01/04/2007  DATE OF DISCHARGE:                                 CONSULTATION   ADMITTING DIAGNOSES:  1. Right parietotemporal high-density mass on computed tomography      scan.  2. Headache.   INDICATIONS:  Debbie Werner is a 55 year old who, over the last 2 to 3  weeks, has had a rather severe headache.  Approximately a week ago, she  felt a headache which she described as an explosion which went straight  to the top of her head and then, as she describes, it came out of her  ears.  She had a long period of headaches prior to the last 2 years,  secondary to migraines.  The migraines were centered around her  menstrual cycle.  She has not had a cycle for about 3 years and her  headaches have subsided substantially over that period of time.  When a  headache occurred about a week ago, she did not lose consciousness.  She  did not have blurred vision.  She was not particularly nauseated.  She  had no emesis.  She kept going and stayed with her usual activities over  the last 2 to 3 weeks.  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  She works in a [employer].  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  Her husband was present and witnessed this loss  of consciousness.  She did not shake, she did not move, neither did she  void nor did she have a bowel movement.  She regained consciousness  within seconds.  She does not have a history of seizures.  She went to  see her physician at [facility].  Her P.A. there, after  examining her, sent her to get a head CT at [facility].  That head CT  showed this  high-density lesion in her right parietotemporal area.   She does not smoke.  She does not use illegal drugs.  She does not use  tobacco.   She takes no medications.   REVIEW OF SYSTEMS:  Positive only for headaches.  She denies  constitutional, eye, ear, nose, throat, mouth, cardiovascular,  respiratory, gastrointestinal, genitourinary, musculoskeletal, skin,  psychiatric, endocrine or hematologic problems.  She is currently  married.   CURRENT VITAL SIGNS:  Temperature of 96.7, pulse 82, respiratory rate  16.  She is hypertensive in the emergency room and has remained so.  They did institute an antihypertensive, but that has not changed  significantly.   PHYSICAL EXAMINATION:  GENERAL APPEARANCE:  On examination, she is  alert, oriented x4 and answering all questions appropriately.  Memory,  language, attention span and fund of knowledge are normal.  She is well  groomed and in minor distress.  NEUROLOGICAL EXAM:  Pupils are equal, round and reactive to light.  She  has full extraocular movements, full visual fields.  She has symmetric  facies and symmetric facial sensation.  Hearing is intact to finger rub  bilaterally.  Tongue protrudes in the midline.  Uvula elevates in the  midline.  Shoulder shrug is normal.  She has no drift on examination in  the upper extremities.  She has normal finger-nose-finger testing.  Proprioception and light touch are intact.  She has normal muscle tone,  bulk and coordination.  No cervical masses or bruits.  LUNGS:  The lung fields are clear.  HEART/CARDIOVASCULAR:  Regular rhythm and rate.  No murmurs or rubs.  Pulses are good at the wrists and feet bilaterally.  No clubbing,  cyanosis or edema.  ABDOMEN:  Soft, nontender.   I neglected to mention Mrs. Debbie Werner does have a history of a  melanoma being removed from the left hip approximately 1 year ago.  She  said it was characterized as a stage III lesion and she is followed at  [facility]  for this.   CT shows a lesion in the right parietotemporal area with very little  surrounding edema but very high-density.  This is worrisome for possible  melanoma.  Dr. Nash Shearer has also been consulted to see the patient.  She  will receive an MRI and, I think based on the results of the MRI, it  will determine on whose service she goes.  Currently, she is stable,  looks quite good.  Blood pressure does need to be treated more  aggressively and orders were given to increase the dose of the  medication.           ______________________________  Coletta Memos, M.D.     KC/MEDQ  D:  01/04/2007  T:  01/06/2007  Job:  [id number]

## 2010-07-31 ENCOUNTER — Telehealth: Payer: Self-pay | Admitting: *Deleted

## 2010-07-31 NOTE — Telephone Encounter (Signed)
[facility] Triage Call Report Triage Record Num: [id number] Operator: Merlinda Frederick Patient Name: Debbie Werner Call Date & Time: 07/30/2010 3:10:41PM Patient Phone: [phone] PCP: Evelena Peat Patient Gender: Female PCP Fax : [phone] Patient DOB: 1956/01/25 [facility] Name[facility] Reason for Call: Pt calling 07/30/10 about vomiting. Onset 07/29/10. Vomited x 20 today. Afebrile. Pt has a hx of HTN, unable to take Lisinopril due to vomiting. Per Nausea or Vomiting Guideline, all emergent sxs ruled out, call provider disp due to pt being unable to take BP meds. Per Standing Orders, will call in Phenergan 25 mg suppositories, 1 q4 hrs prn per rectum, # 6 with no refills to [facility] at [phone], message left on pharm voicemail. Advised pt to call office back within 4 hrs if unable to take BP med and keep down fluid after taking Phenergan Suppository. Also advised to call office in am 07/31/10 for f/u. Advised call back parameters, verbalized understanding. Protocol(s) Used: Nausea or Vomiting Recommended Outcome per Protocol: Call Provider Immediately Reason for Outcome: Unable to take or keep down prescription medication (heart, respiratory, diabetes, thyroid, antibiotics, birth control pills, corticosteroids) because of nausea/vomiting Care Advice: ~ Speak with provider before next dose of medication is due. See another provider immediately if unable to talk with your provider within 1 hour. Consider the closest urgent care or ED if an office or clinic is not available. Another adult should drive. ~ Vomiting Care Advice: - Do not eat solid foods until vomiting subsides. - Begin taking fluids by sucking on ice chips or popsicles or taking sips of cool clear, nonprescription oral rehydration solution). - Gradually drink larger amounts of these fluids so that you are drinking six to eight 8 oz. (.2 liter) of fluids a day. - Keep activity to a minimum. - After vomiting  subsides, eat smaller, more frequent meals of easily digested foods such as crackers, toast, bananas, rice, cooked cereal, applesauce, broth, baked or mashed potatoes, chicken or [place] without skin. Eat slowly. - Take fluids 30 minutes before or 60 minutes after meals. - Avoid high fat, highly seasoned, high fiber or high sugar content foods. - Avoid extremely hot or cold foods. - Do not take pain medication (such as aspirin, NSAIDs) while nauseated or vomiting. - Consult your provider for advice regarding continuing prescription medication. - Rest as much as possible in a sitting or in a propped lying position. Do not lie flat for at least 2 hours after eating. ~ 07/30/2010 3:25:02PM Page 1 of 1 [email or web address]

## 2010-07-31 NOTE — Telephone Encounter (Signed)
[facility] Triage Call Report Triage Record Num: [id number] Operator: Amy Head Patient Name: Debbie Werner Call Date & Time: 07/30/2010 8:45:29PM Patient Phone: [phone] PCP: Evelena Peat Patient Gender: Female PCP Fax : [phone] Patient DOB: 1955-08-17 [facility] Reason for Call: Pt/Brecklyn calling back and states that she spoke with a nurse earlier and was told to call back if vomiting was getting better and to report her BP. States that nausea is better, still having dry heaving. Has only been able to keep down possibly 1 TBSP full. Has not been able to take her BP med. BP at time of call is 155/105. Normally 120/75-80. Has vomited x 20 since starting last night, 07/29/10. No diarrhea. Afebrile. Last episode of vomiting was approx 2 hours ago. Last urination was then as well. Mouth and tongue is very dry, very thirsty, increased drowsiness. All 911 sxs per HTN, Diagnosed or Suspected and Nausea or Vomiting protocol r/o. Advised to have someone take her to the ED per guidelines. Protocol(s) Used: Hypertension, Diagnosed or Suspected Recommended Outcome per Protocol: See Provider within 24 hours Reason for Outcome: A sudden elevation in blood pressure AND has not been taking blood pressure medication as prescribed, or recently started, changed, or stopped taking prescription medication; or recently started taking nonprescription or alternative medicine Care Advice: ~ 07/30/2010 8:57:59PM Page 1 of 1 [email or web address]_[email or web address] [facility] Triage Call Report Triage Record Num: [id number] Operator: Amy Head Patient Name: Debbie Werner Call Date & Time: 07/30/2010 8:45:29PM Patient Phone: [phone] PCP: Evelena Peat Patient Gender: Female PCP Fax : [phone] Patient DOB: [birth year] [facility] Name[facility] Reason for Call: Pt/Debbie Werner calling back and states that she spoke with a nurse earlier and was told to call back if vomiting was getting  better and to report her BP. States that nausea is better, still having dry heaving. Has only been able to keep down possibly 1 TBSP full. Has not been able to take her BP med. BP at time of call is 155/105. Normally 120/75-80. Has vomited x 20 since starting last night, 07/29/10. No diarrhea. Afebrile. Last episode of vomiting was approx 2 hours ago. Last urination was then as well. Mouth and tongue is very dry, very thirsty, increased drowsiness. All 911 sxs per HTN, Diagnosed or Suspected and Nausea or Vomiting protocol r/o. Advised to have someone take her to the ED per guidelines. Protocol(s) Used: Nausea or Vomiting Recommended Outcome per Protocol: See ED Immediately Reason for Outcome: Signs of dehydration Care Advice: ~ Protect the patient from falling or other harm. ~ Another adult should drive. Call EMS 911 if signs and symptoms of shock develop (such as unable to stand due to faintness, dizziness, or lightheadedness; new onset of confusion; slow to respond or difficult to awaken; skin is pale, gray, cool, or moist to touch; severe weakness; loss of consciousness). ~ ~ IMMEDIATE ACTION Write down provider's name. List or place the following in a bag for transport with the patient: current prescription and/or nonprescription medications; alternative treatments, therapies and medications; and street drugs. ~ May have clear liquids (such as water, clear fruit juices without pulp, soda, tea or coffee without dairy or non-dairy creamer, clear broth or bouillon, oral hydration solution, or plain gelatin, fruit ices/popsicles, hard candy) but do not eat solid foods before being seen by your provider. ~ 07/30/2010 8:58:00PM Page 1 of 1 [email or web address]_[email or web address]

## 2010-08-30 ENCOUNTER — Other Ambulatory Visit: Payer: Self-pay | Admitting: Family Medicine

## 2010-11-28 LAB — URINALYSIS, ROUTINE W REFLEX MICROSCOPIC
Bilirubin Urine: NEGATIVE
Glucose, UA: NEGATIVE
Ketones, ur: NEGATIVE
Nitrite: NEGATIVE
Specific Gravity, Urine: 1.01
pH: 5.5

## 2010-11-28 LAB — CBC
HCT: 42.5
HCT: 44.5
MCHC: 33.5
Platelets: 330
Platelets: 346
RBC: 5.1
RDW: 13.1
WBC: 11.1 — ABNORMAL HIGH
WBC: 8.9

## 2010-11-28 LAB — URINE CULTURE

## 2010-11-28 LAB — PROTIME-INR: INR: 1

## 2010-11-28 LAB — CULTURE, BLOOD (ROUTINE X 2)

## 2010-11-28 LAB — I-STAT 8, (EC8 V) (CONVERTED LAB)
BUN: 11
Chloride: 108
HCT: 47 — ABNORMAL HIGH
Hemoglobin: 16 — ABNORMAL HIGH
Operator id: 196461
Sodium: 138
TCO2: 25
pCO2, Ven: 34.4 — ABNORMAL LOW

## 2010-11-28 LAB — COMPREHENSIVE METABOLIC PANEL
ALT: 20
AST: 20
Albumin: 3.6
Alkaline Phosphatase: 77
BUN: 14
CO2: 25
Calcium: 9.6
Chloride: 103
Creatinine, Ser: 0.9
GFR calc Af Amer: >60
GFR calc non Af Amer: >60
Glucose, Bld: 120 — ABNORMAL HIGH
Potassium: 4.2
Sodium: 139
Total Bilirubin: 0.7
Total Protein: 6.8

## 2010-11-28 LAB — POCT I-STAT CREATININE: Creatinine, Ser: 1.1

## 2010-11-28 LAB — TROPONIN I: Troponin I: 0.02

## 2010-11-28 LAB — BASIC METABOLIC PANEL
BUN: 16
Calcium: 9.3
Creatinine, Ser: 0.99
GFR calc non Af Amer: 59 — ABNORMAL LOW
Potassium: 3.2 — ABNORMAL LOW

## 2010-11-28 LAB — DIFFERENTIAL
Basophils Absolute: 0
Eosinophils Relative: 1
Lymphocytes Relative: 27
Lymphs Abs: 1.9
Lymphs Abs: 3
Neutro Abs: 7.1
Neutrophils Relative %: 63

## 2010-11-28 LAB — CARDIAC PANEL(CRET KIN+CKTOT+MB+TROPI)
CK, MB: 1.3
Relative Index: INVALID
Relative Index: INVALID
Total CK: 38
Total CK: 43
Troponin I: 0.01

## 2010-11-28 LAB — LIPID PANEL: HDL: 39 — ABNORMAL LOW

## 2010-11-28 LAB — CALCIUM: Calcium: 9.1

## 2010-11-28 LAB — CK TOTAL AND CKMB (NOT AT ARMC)
CK, MB: 1.3
Relative Index: INVALID

## 2010-11-28 LAB — T3, FREE: T3, Free: 3.2 (ref 2.3–4.2)

## 2010-11-28 LAB — T4, FREE: Free T4: 1.49

## 2010-11-28 LAB — TSH: TSH: 5.87 — ABNORMAL HIGH

## 2010-12-04 ENCOUNTER — Other Ambulatory Visit: Payer: Self-pay | Admitting: Family Medicine

## 2010-12-13 ENCOUNTER — Encounter: Payer: Self-pay | Admitting: Family Medicine

## 2010-12-14 ENCOUNTER — Ambulatory Visit (INDEPENDENT_AMBULATORY_CARE_PROVIDER_SITE_OTHER): Payer: BC Managed Care – PPO | Admitting: Family Medicine

## 2010-12-14 ENCOUNTER — Encounter: Payer: Self-pay | Admitting: Family Medicine

## 2010-12-14 DIAGNOSIS — G4700 Insomnia, unspecified: Secondary | ICD-10-CM

## 2010-12-14 DIAGNOSIS — E785 Hyperlipidemia, unspecified: Secondary | ICD-10-CM

## 2010-12-14 DIAGNOSIS — K219 Gastro-esophageal reflux disease without esophagitis: Secondary | ICD-10-CM

## 2010-12-14 DIAGNOSIS — I10 Essential (primary) hypertension: Secondary | ICD-10-CM

## 2010-12-14 LAB — LIPID PANEL
HDL: 41.5 mg/dL (ref >39.00)
Triglycerides: 190 mg/dL — ABNORMAL HIGH (ref 0.0–149.0)
VLDL: 38 mg/dL (ref 0.0–40.0)

## 2010-12-14 LAB — LDL CHOLESTEROL, DIRECT: Direct LDL: 199.7 mg/dL

## 2010-12-14 LAB — BASIC METABOLIC PANEL
CO2: 29 mEq/L (ref 19–32)
Calcium: 9.1 mg/dL (ref 8.4–10.5)
GFR: 55.08 mL/min — ABNORMAL LOW (ref >60.00)
Glucose, Bld: 93 mg/dL (ref 70–99)
Potassium: 4.4 mEq/L (ref 3.5–5.1)
Sodium: 141 mEq/L (ref 135–145)

## 2010-12-14 LAB — HM MAMMOGRAPHY: HM Mammogram: NEGATIVE

## 2010-12-14 NOTE — Progress Notes (Signed)
  Subjective:    Patient ID: Debbie Werner, female    DOB: January 02, 1956, 55 y.o.   MRN: [id number]  HPI  Medical followup. History of hyperlipidemia. Has gained some weight back recently. Does not take any medication for this. No history of CAD or peripheral vascular disease. Father had coronary disease in his 57s.  Hypertension treated with lisinopril 10 mg daily. No side effects. Compliant with therapy. Blood pressure well-controlled. No dizziness.  History of GERD. Controlled with intermittent omeprazole. She has history of chronic anxiety and insomnia and uses low-dose alprazolam. Depression treated with sertraline 50 mg. Symptoms stable. Patient declines flu vaccine  Past Medical History  Diagnosis Date  . ALLERGIC RHINITIS 08/03/2008  . CHEST PAIN, ATYPICAL 10/12/2008  . CONSTIPATION, CHRONIC 12/08/2009  . DEPRESSION 12/08/2009  . GERD 08/03/2008  . HEMATOCHEZIA 05/02/2009  . HYPERLIPIDEMIA 08/03/2008  . HYPERTENSION 08/03/2008  . SINUSITIS, CHRONIC 02/24/2009   Past Surgical History  Procedure Date  . Breast surgery     bx    reports that she has never smoked. She does not have any smokeless tobacco history on file. Her alcohol and drug histories not on file. family history is negative for Hyperlipidemia, and Hypertension, and Stroke, and Diabetes, . Allergies  Allergen Reactions  . Pravastatin Sodium     REACTION: effects brain memory  . Sulfadiazine       Review of Systems  Constitutional: Negative for fatigue.  Eyes: Negative for visual disturbance.  Respiratory: Negative for cough, chest tightness, shortness of breath and wheezing.   Cardiovascular: Negative for chest pain, palpitations and leg swelling.  Genitourinary: Negative for dysuria.  Neurological: Negative for dizziness, seizures, syncope, weakness, light-headedness and headaches.       Objective:   Physical Exam  Constitutional: She is oriented to person, place, and time. She appears well-developed and  well-nourished. No distress.  HENT:  Mouth/Throat: Oropharynx is clear and moist.  Neck: Neck supple. No thyromegaly present.  Cardiovascular: Normal rate, regular rhythm and normal heart sounds.   Pulmonary/Chest: Effort normal and breath sounds normal. No respiratory distress. She has no wheezes. She has no rales.  Abdominal: Soft. Bowel sounds are normal. She exhibits no distension. There is no tenderness. There is no rebound.  Musculoskeletal: She exhibits no edema.  Lymphadenopathy:    She has no cervical adenopathy.  Neurological: She is alert and oriented to person, place, and time.          Assessment & Plan:  #1 hypertension. Stable. Continue current medication. Check basic metabolic panel #2 hyperlipidemia. Check lipid panel. Work on weight loss #3 GERD stable continue omeprazole #4 chronic insomnia. Continue low-dose alprazolam. #5 health maintenance. Flu vaccine recommended and patient declines.

## 2010-12-15 NOTE — Progress Notes (Signed)
Quick Note:  Pt informed on home VM, will mail a copy to pt home ______

## 2011-01-10 ENCOUNTER — Other Ambulatory Visit: Payer: Self-pay | Admitting: Family Medicine

## 2011-01-10 NOTE — Telephone Encounter (Signed)
Refill for 6 months. 

## 2011-01-10 NOTE — Telephone Encounter (Signed)
Alprazolam historically entered to med list on 06-26-10. This has not been filled yet in Epic

## 2011-01-17 ENCOUNTER — Other Ambulatory Visit: Payer: Self-pay

## 2011-01-17 MED ORDER — SERTRALINE HCL 50 MG PO TABS: 50.0000 mg | ORAL_TABLET | Freq: Every day | ORAL | Status: DC | PRN

## 2011-02-04 ENCOUNTER — Other Ambulatory Visit: Payer: Self-pay | Admitting: Family Medicine

## 2011-04-10 ENCOUNTER — Ambulatory Visit (INDEPENDENT_AMBULATORY_CARE_PROVIDER_SITE_OTHER): Payer: BC Managed Care – PPO | Admitting: Family Medicine

## 2011-04-10 ENCOUNTER — Encounter: Payer: Self-pay | Admitting: Family Medicine

## 2011-04-10 VITALS — BP 120/88 | Temp 97.8°F | Wt 170.0 lb

## 2011-04-10 DIAGNOSIS — R519 Headache, unspecified: Secondary | ICD-10-CM

## 2011-04-10 DIAGNOSIS — R51 Headache: Secondary | ICD-10-CM

## 2011-04-10 MED ORDER — PENICILLIN V POTASSIUM 500 MG PO TABS: 500.0000 mg | ORAL_TABLET | Freq: Three times a day (TID) | ORAL | Status: AC

## 2011-04-10 NOTE — Progress Notes (Signed)
  Subjective:    Patient ID: Debbie Werner, female    DOB: 1955-09-28, 56 y.o.   MRN: [id number]  HPI  Acute visit. Approximately one week ago noticed some left maxillary facial swelling. She noticed some swelling of left upper gum region. Went to dentist and x-rays with no abscess determined.  Had minimal bloody drainage from the swollen area. No injury. She has frequent problems with allergic rhinitis takes Zyrtec-D fairly regularly. Possible low-grade fever earlier. Left maxillary pain and upper gum pain is actually improving. No sore throat. No cough.   Review of Systems  Constitutional: Negative for fever and chills.  HENT: Negative for sore throat and trouble swallowing.   Respiratory: Negative for cough.   Hematological: Negative for adenopathy.       Objective:   Physical Exam  Constitutional: She appears well-developed and well-nourished.  HENT:  Right Ear: External ear normal.  Left Ear: External ear normal.  Nose: Nose normal.       Minimal erythema left upper gum around incisor. No fluctuance. No purulent drainage.  Neck: Neck supple.  Cardiovascular: Normal rate and regular rhythm.   Pulmonary/Chest: Effort normal and breath sounds normal. No respiratory distress. She has no wheezes. She has no rales.          Assessment & Plan:  Left maxillary facial pain. Doubt acute sinusitis. Possible mild gum abscess resolving. Observe for now. Start penicillin V 500 mg 3 times a day for 10 days if she has any recurrent gum swelling

## 2011-04-10 NOTE — Patient Instructions (Signed)
Start antibiotic for any progressive gum redness, fever, or facial swelling

## 2011-07-02 ENCOUNTER — Other Ambulatory Visit: Payer: Self-pay | Admitting: Family Medicine

## 2011-07-04 NOTE — Telephone Encounter (Signed)
Alprazolam last filled 01-01-11, #30 with 5 refills

## 2011-07-04 NOTE — Telephone Encounter (Signed)
Refill for 6 months. 

## 2011-08-10 ENCOUNTER — Other Ambulatory Visit: Payer: Self-pay | Admitting: Family Medicine

## 2011-08-21 ENCOUNTER — Other Ambulatory Visit: Payer: Self-pay | Admitting: Family Medicine

## 2011-09-18 ENCOUNTER — Ambulatory Visit (INDEPENDENT_AMBULATORY_CARE_PROVIDER_SITE_OTHER): Payer: BC Managed Care – PPO | Admitting: Internal Medicine

## 2011-09-18 ENCOUNTER — Encounter: Payer: Self-pay | Admitting: Internal Medicine

## 2011-09-18 VITALS — BP 144/102 | HR 86 | Temp 97.7°F | Wt 172.0 lb

## 2011-09-18 DIAGNOSIS — K644 Residual hemorrhoidal skin tags: Secondary | ICD-10-CM | POA: Insufficient documentation

## 2011-09-18 MED ORDER — PRAMOXINE-ZINC OXIDE IN MO 1-12.5 % RE OINT: TOPICAL_OINTMENT | RECTAL | Status: AC | PRN

## 2011-09-18 MED ORDER — LIDOCAINE 5 % EX OINT: TOPICAL_OINTMENT | Freq: Three times a day (TID) | CUTANEOUS | Status: AC

## 2011-09-18 NOTE — Progress Notes (Signed)
  Subjective:    Patient ID: Debbie Werner, female    DOB: May 13, 1955, 56 y.o.   MRN: [id number]  HPI Patient comes in today for SDA for  new problem evaluation. PCP not available today  Has had external hemorrhoids off and on for a while with tags and irritation that time no history of bleeding she states   Most recently over the last 36 hours she has had some pain and swelling in the rectal area that was worse yesterday than today she uses some Epsom salt compresses and preparation H. 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 There is no fever  She's never had a colonoscopy although encouraged to do so and is on her list to evaluate has concerns about the prep. No personal history of significant bowel disease. She does have intermittent constipation.  Review of Systems Outpatient Encounter Prescriptions as of 09/18/2011  Medication Sig Dispense Refill  . acyclovir (ZOVIRAX) 400 MG tablet TAKE ONE TABLET BY MOUTH FIVE TIMES PER DAY FOR 5 DAYS  25 tablet  0  . ALPRAZolam (XANAX) 0.25 MG tablet TAKE ONE TABLET BY MOUTH AT BEDTIME AS NEEDED  30 tablet  5  . cetirizine (ZYRTEC) 10 MG tablet Take 10 mg by mouth 2 (two) times daily.      Marland Kitchen lisinopril (PRINIVIL,ZESTRIL) 10 MG tablet TAKE ONE TABLET BY MOUTH EVERY DAY  90 tablet  2  . omeprazole (PRILOSEC) 20 MG capsule TAKE ONE CAPSULE BY MOUTH EVERY DAY AS NEEDED  30 capsule  5  . sertraline (ZOLOFT) 50 MG tablet Take 1 tablet (50 mg total) by mouth daily as needed.  30 tablet  5  . DISCONTD: calcium-vitamin D (OSCAL WITH D) 500-200 MG-UNIT per tablet Take 1 tablet by mouth daily.        Marland Kitchen DISCONTD: neomycin-polymyxin b-dexamethasone (MAXITROL) 3.5-10000-0.1 OINT       . DISCONTD: vitamin B-12 (CYANOCOBALAMIN) 100 MCG tablet Take 50 mcg by mouth daily.             Objective:   Physical Exam BP 144/102  Pulse 86  Temp 97.7 F (36.5 C) (Oral)  Wt 172 lb (78.019 kg)  SpO2  99%  Well-developed well-nourished in no acute distress a bit uncomfortable under certain situations. Her gait is normal Examination of abdomen soft external rectum shows a large external hemorrhoid about 1 cm pink firm there is a 1 mm area that looks thrombotic and purplish.    Assessment & Plan:  Acute hemorrhoidal swelling Is not appear acutely thrombotic except for one tiny area. I think it would be less likely for  A procedure to help her today although it is early as I see very little thrombosis. She states it is better today than was yesterday.  Suggestion use tucks and given a prescription for Xylocaine ointment to apply as needed. Continue stool softening avoid straining and prolonged sitting. Expectant management and followup as appropriate encouraged her to get colonoscopy in the future

## 2011-09-18 NOTE — Patient Instructions (Signed)

## 2011-10-18 ENCOUNTER — Other Ambulatory Visit (INDEPENDENT_AMBULATORY_CARE_PROVIDER_SITE_OTHER): Payer: BC Managed Care – PPO

## 2011-10-18 DIAGNOSIS — Z0000 Encounter for general adult medical examination without abnormal findings: Secondary | ICD-10-CM

## 2011-10-18 LAB — CBC WITH DIFFERENTIAL/PLATELET
Basophils Absolute: 0.1 10*3/uL (ref 0.0–0.1)
Eosinophils Absolute: 0.3 10*3/uL (ref 0.0–0.7)
Lymphocytes Relative: 34.9 % (ref 12.0–46.0)
MCHC: 33 g/dL (ref 30.0–36.0)
MCV: 87.1 fl (ref 78.0–100.0)
Monocytes Absolute: 0.5 10*3/uL (ref 0.1–1.0)
Neutrophils Relative %: 51.7 % (ref 43.0–77.0)
Platelets: 271 10*3/uL (ref 150.0–400.0)

## 2011-10-18 LAB — LIPID PANEL
Cholesterol: 258 mg/dL — ABNORMAL HIGH (ref 0–200)
Triglycerides: 157 mg/dL — ABNORMAL HIGH (ref 0.0–149.0)
VLDL: 31.4 mg/dL (ref 0.0–40.0)

## 2011-10-18 LAB — HEPATIC FUNCTION PANEL
Alkaline Phosphatase: 68 U/L (ref 39–117)
Bilirubin, Direct: 0.1 mg/dL (ref 0.0–0.3)
Total Bilirubin: 0.7 mg/dL (ref 0.3–1.2)
Total Protein: 6.5 g/dL (ref 6.0–8.3)

## 2011-10-18 LAB — BASIC METABOLIC PANEL
BUN: 19 mg/dL (ref 6–23)
CO2: 26 mEq/L (ref 19–32)
Calcium: 9.1 mg/dL (ref 8.4–10.5)
Chloride: 106 mEq/L (ref 96–112)
Creatinine, Ser: 1 mg/dL (ref 0.4–1.2)

## 2011-10-18 LAB — POCT URINALYSIS DIPSTICK
Bilirubin, UA: NEGATIVE
Glucose, UA: NEGATIVE
Ketones, UA: NEGATIVE
Leukocytes, UA: NEGATIVE
Protein, UA: NEGATIVE

## 2011-10-29 ENCOUNTER — Ambulatory Visit (INDEPENDENT_AMBULATORY_CARE_PROVIDER_SITE_OTHER): Payer: BC Managed Care – PPO | Admitting: Family Medicine

## 2011-10-29 ENCOUNTER — Telehealth: Payer: Self-pay | Admitting: *Deleted

## 2011-10-29 ENCOUNTER — Encounter: Payer: Self-pay | Admitting: Family Medicine

## 2011-10-29 VITALS — BP 150/90 | HR 72 | Temp 97.6°F | Resp 12 | Ht 60.5 in | Wt 176.0 lb

## 2011-10-29 DIAGNOSIS — F329 Major depressive disorder, single episode, unspecified: Secondary | ICD-10-CM

## 2011-10-29 DIAGNOSIS — Z0000 Encounter for general adult medical examination without abnormal findings: Secondary | ICD-10-CM

## 2011-10-29 DIAGNOSIS — F3289 Other specified depressive episodes: Secondary | ICD-10-CM

## 2011-10-29 DIAGNOSIS — I10 Essential (primary) hypertension: Secondary | ICD-10-CM

## 2011-10-29 DIAGNOSIS — E785 Hyperlipidemia, unspecified: Secondary | ICD-10-CM

## 2011-10-29 DIAGNOSIS — R319 Hematuria, unspecified: Secondary | ICD-10-CM

## 2011-10-29 LAB — POCT URINALYSIS DIPSTICK
Blood, UA: NEGATIVE
Ketones, UA: NEGATIVE
Protein, UA: NEGATIVE
Spec Grav, UA: 1.01

## 2011-10-29 NOTE — Patient Instructions (Addendum)
Consider colonoscopy at some point later this year Monitor blood pressure at home and be in touch if consistently getting readings over 140/90

## 2011-10-29 NOTE — Telephone Encounter (Signed)
As I explained, weight gain/loss does not always correlate well with lipid levels.  I am not concerned that he levels are lower.

## 2011-10-29 NOTE — Progress Notes (Signed)
Subjective:    Patient ID: Debbie Werner, female    DOB: 08/30/1955, 56 y.o.   MRN: [id number]  HPI  Patient here for complete physical. She sees gynecologist regularly. She has history of chronic problems including hypertension, depression, GERD. She has struggled with weight for several years. History.several weight loss programs without much success. She is considering lap band gastric surgery. She is scheduled for informational class later this month. She has hypertension treated with lisinopril 10 mg daily. Compliant with therapy. No headaches. Not monitoring blood pressures. She's also had some progressive joint pains involving knees and hands. She has frequent GERD symptoms. No history of diabetes but does have history of gestational diabetes.  She continues to gynecologist for Pap smears. Mammogram less than a year ago normal. No history of screening colonoscopy. She is considering. Tetanus up-to-date. She declines flu vaccines  Past Medical History  Diagnosis Date  . ALLERGIC RHINITIS 08/03/2008  . CHEST PAIN, ATYPICAL 10/12/2008  . CONSTIPATION, CHRONIC 12/08/2009  . DEPRESSION 12/08/2009  . GERD 08/03/2008  . HEMATOCHEZIA 05/02/2009  . HYPERLIPIDEMIA 08/03/2008  . HYPERTENSION 08/03/2008  . SINUSITIS, CHRONIC 02/24/2009   Past Surgical History  Procedure Date  . Breast surgery     bx    reports that she has never smoked. She does not have any smokeless tobacco history on file. Her alcohol and drug histories not on file. family history includes Heart disease (age of onset:62) in her father.  There is no history of Hyperlipidemia, and Hypertension, and Stroke, and Diabetes, . Allergies  Allergen Reactions  . Pravastatin Sodium     REACTION: effects brain memory  . Sulfadiazine     GI upset     Review of Systems  Constitutional: Positive for fatigue. Negative for fever, activity change and appetite change.  HENT: Negative for hearing loss, ear pain, sore throat and trouble  swallowing.   Eyes: Negative for visual disturbance.  Respiratory: Negative for cough and shortness of breath.   Cardiovascular: Negative for chest pain and palpitations.  Gastrointestinal: Negative for abdominal pain, diarrhea, constipation and blood in stool.  Genitourinary: Negative for dysuria and hematuria.  Musculoskeletal: Positive for arthralgias. Negative for myalgias and back pain.  Skin: Negative for rash.  Neurological: Negative for dizziness, syncope and headaches.  Hematological: Negative for adenopathy.  Psychiatric/Behavioral: Positive for dysphoric mood. Negative for confusion.       Objective:   Physical Exam  Constitutional: She is oriented to person, place, and time. She appears well-developed and well-nourished.  HENT:  Head: Normocephalic and atraumatic.  Eyes: EOM are normal. Pupils are equal, round, and reactive to light.  Neck: Normal range of motion. Neck supple. No thyromegaly present.  Cardiovascular: Normal rate, regular rhythm and normal heart sounds.   No murmur heard. Pulmonary/Chest: Breath sounds normal. No respiratory distress. She has no wheezes. She has no rales.  Abdominal: Soft. Bowel sounds are normal. She exhibits no distension and no mass. There is no tenderness. There is no rebound and no guarding.  Genitourinary:       Per gyn   Musculoskeletal: Normal range of motion. She exhibits no edema.  Lymphadenopathy:    She has no cervical adenopathy.  Neurological: She is alert and oriented to person, place, and time. She displays normal reflexes. No cranial nerve deficit.  Skin: No rash noted.  Psychiatric: She has a normal mood and affect. Her behavior is normal. Judgment and thought content normal.  Assessment & Plan:  #1complete physical. Patient encouraged to continue exercise and efforts for weight control. Tetanus up-to-date. Flu vaccine offered and declined. Hemoccult cards given. She'll let us know when she decides on  colonoscopy #2 trace hematuria on urine dipstick. Repeat today and if still positive send for urine micro- #3 hypertension. Elevated today. Monitor closely at home next month. Be in touch if consistently over 140/90 #4 history of GERD with intermittent symptoms on omeprazole. Continue weight loss efforts

## 2011-10-29 NOTE — Telephone Encounter (Signed)
Pt had CPX this am and was looking closer at the cholesterol and triglycerides and comparing current labs with past labs, she cannot understand how she can gain 20 lbs and her diet is the worse it's ever been.  She is glad the numbers have improved but "are we missing something"? Please advise

## 2011-10-30 NOTE — Telephone Encounter (Signed)
Pt informed on personally identified VM 

## 2011-11-07 ENCOUNTER — Telehealth: Payer: Self-pay | Admitting: Family Medicine

## 2011-11-07 NOTE — Telephone Encounter (Signed)
Pt called re: bp. Wanted to go over some readings, but wanted to talk to nurse about it first. Pt req call back from [place].

## 2011-11-08 NOTE — Telephone Encounter (Signed)
Pt was instructed to monitor BP, she is taking med at bedtime.  4 am one day, 153/112, next day awoke 5 am with a pounding headache, 153/99. A couple of times randomly 160/110.  She took it a couple minutes ago (2:45) 136/88 Please advise

## 2011-11-09 NOTE — Telephone Encounter (Signed)
Increase lisinopril to 20 mg daily (could double up her current 10 mg) and if BP >140/90 by next week let me know.

## 2011-11-09 NOTE — Telephone Encounter (Signed)
Pt informed and voiced understanding

## 2011-11-13 ENCOUNTER — Encounter: Payer: Self-pay | Admitting: Family Medicine

## 2011-11-20 ENCOUNTER — Encounter: Payer: Self-pay | Admitting: Family Medicine

## 2011-11-22 ENCOUNTER — Telehealth: Payer: Self-pay | Admitting: Family Medicine

## 2011-11-22 NOTE — Telephone Encounter (Signed)
I explained and apologized to pt for wrong labs printed for her OV on 10/29/11.  Pt had a few questions re: a form that required filling out for [facility], these questions I could not answer.  Pt would like to ask Dr Caryl Never.

## 2011-11-22 NOTE — Telephone Encounter (Signed)
Patient called stating that she was in on 10/29/11 for her cpx and the MD was speaking with her about symptoms that she did not understand and when she got home she forgot about and was going over her information today and discovered that she has another patient's information. Patient is also concerned that she was supposed to have had some info sent over to [facility] and wants to make sure that the correct information is sent. Please advise and assist. Patient is now at work [phone] and has an appt across the street and if not called back by 12p she will stop by.

## 2011-11-23 NOTE — Telephone Encounter (Signed)
Long discussion with patient.  She is assured that her information (including letter dictated to [facility]) is all correct.  I have apologized profusely that she was given wrong copy of labs.  I have discussed with my nurse that we will not pre-print labs for any patients when they present for physicals and I will print them at the time of their visit to avoid this happening in future.  Pt did bring back copy of handout she was given.

## 2011-12-07 ENCOUNTER — Ambulatory Visit (INDEPENDENT_AMBULATORY_CARE_PROVIDER_SITE_OTHER): Payer: BC Managed Care – PPO | Admitting: Surgery

## 2011-12-07 ENCOUNTER — Encounter (INDEPENDENT_AMBULATORY_CARE_PROVIDER_SITE_OTHER): Payer: Self-pay | Admitting: Surgery

## 2011-12-07 VITALS — BP 152/98 | HR 76 | Temp 97.7°F | Resp 18 | Ht 60.0 in | Wt 182.4 lb

## 2011-12-07 DIAGNOSIS — E669 Obesity, unspecified: Secondary | ICD-10-CM

## 2011-12-07 LAB — CBC WITH DIFFERENTIAL/PLATELET
Eosinophils Relative: 4 % (ref 0–5)
HCT: 40.2 % (ref 36.0–46.0)
Lymphocytes Relative: 29 % (ref 12–46)
Lymphs Abs: 2.3 10*3/uL (ref 0.7–4.0)
MCV: 84.3 fL (ref 78.0–100.0)
Monocytes Absolute: 0.6 10*3/uL (ref 0.1–1.0)
RBC: 4.77 MIL/uL (ref 3.87–5.11)
RDW: 13.3 % (ref 11.5–15.5)
WBC: 8 10*3/uL (ref 4.0–10.5)

## 2011-12-07 LAB — COMPREHENSIVE METABOLIC PANEL
AST: 20 U/L (ref 0–37)
Alkaline Phosphatase: 81 U/L (ref 39–117)
BUN: 19 mg/dL (ref 6–23)
Calcium: 9.7 mg/dL (ref 8.4–10.5)
Chloride: 105 mEq/L (ref 96–112)
Creat: 1.01 mg/dL (ref 0.50–1.10)
Total Bilirubin: 0.5 mg/dL (ref 0.3–1.2)

## 2011-12-07 LAB — LIPID PANEL
Cholesterol: 287 mg/dL — ABNORMAL HIGH (ref 0–200)
HDL: 38 mg/dL — ABNORMAL LOW (ref >39)
Triglycerides: 340 mg/dL — ABNORMAL HIGH (ref <150)
VLDL: 68 mg/dL — ABNORMAL HIGH (ref 0–40)

## 2011-12-07 NOTE — Patient Instructions (Signed)

## 2011-12-07 NOTE — Progress Notes (Signed)
Chief Complaint:  Morbid obesity BMI 36   History of Present Illness:  Debbie Werner is an 56 y.o. female who presents with her husband with a 10 year history of medically supervised weight loss but who continues to be obese with multiple comorbidities.  She is followed by Dr. Evelena Peat.  She has been to one of our seminars and would like to have a Roux-en-Y gastric bypass. She and her husband stated this operation extensively and are aware of the procedure, its risks, and its potential for success. Think she is demonstrated her support and to try to lose weight over time and would be a good candidate for Roux-en-Y gastric bypass. I answered their questions and explained the procedure more detail. We'll begin the workup for Roux-en-Y gastric bypass.  Past Medical History  Diagnosis Date  . ALLERGIC RHINITIS 08/03/2008  . CHEST PAIN, ATYPICAL 10/12/2008  . CONSTIPATION, CHRONIC 12/08/2009  . DEPRESSION 12/08/2009  . GERD 08/03/2008  . HEMATOCHEZIA 05/02/2009  . HYPERLIPIDEMIA 08/03/2008  . HYPERTENSION 08/03/2008  . SINUSITIS, CHRONIC 02/24/2009  . Gestational diabetes     Past Surgical History  Procedure Date  . Breast surgery     bx  . Cystectomy 1996    ovary    Current Outpatient Prescriptions  Medication Sig Dispense Refill  . acyclovir (ZOVIRAX) 400 MG tablet TAKE ONE TABLET BY MOUTH FIVE TIMES PER DAY FOR 5 DAYS  25 tablet  0  . ALPRAZolam (XANAX) 0.25 MG tablet TAKE ONE TABLET BY MOUTH AT BEDTIME AS NEEDED  30 tablet  5  . cetirizine (ZYRTEC) 10 MG tablet Take 10 mg by mouth 2 (two) times daily.      Marland Kitchen lidocaine (XYLOCAINE) 5 % ointment Apply topically 3 (three) times daily. Small amount to affected area.  15 g  0  . lisinopril (PRINIVIL,ZESTRIL) 10 MG tablet TAKE ONE TABLET BY MOUTH EVERY DAY  90 tablet  2  . Multiple Vitamin (MULTIVITAMIN) capsule Take 1 capsule by mouth daily.      Marland Kitchen omeprazole (PRILOSEC) 20 MG capsule TAKE ONE CAPSULE BY MOUTH EVERY DAY AS NEEDED  30  capsule  5  . sertraline (ZOLOFT) 50 MG tablet Take 1 tablet (50 mg total) by mouth daily as needed.  30 tablet  5   Anesthetics, amide; Pravastatin sodium; and Sulfadiazine Family History  Problem Relation Age of Onset  . Hyperlipidemia Neg Hx     parent, grandparent  . Hypertension Neg Hx     parent , grandparent  . Stroke Neg Hx     grandparent  . Diabetes Neg Hx     grandparent  . Heart disease Father 91    CABG   Social History:   reports that she has never smoked. She does not have any smokeless tobacco history on file. She reports that she does not drink alcohol or use illicit drugs.   REVIEW OF SYSTEMS - PERTINENT POSITIVES ONLY: No history of DVT  Physical Exam:   Blood pressure 152/98, pulse 76, temperature 97.7 F (36.5 C), temperature source Temporal, resp. rate 18, height 5' (1.524 m), weight 182 lb 6.4 oz (82.736 kg). Body mass index is 35.62 kg/(m^2).  Gen:  WDWN WF NAD  Neurological: Alert and oriented to person, place, and time. Motor and sensory function is grossly intact  Head: Normocephalic and atraumatic.  Eyes: Conjunctivae are normal. Pupils are equal, round, and reactive to light. No scleral icterus.  Neck: Normal range of motion. Neck supple. No  tracheal deviation or thyromegaly present.  No bruits Cardiovascular:  SR without murmurs or gallops.  No carotid bruits Respiratory: Effort normal.  No respiratory distress. No chest wall tenderness. Breath sounds normal.  No wheezes, rales or rhonchi.  Abdomen:  Nontender GU: Musculoskeletal: Normal range of motion. Extremities are nontender. No cyanosis, edema or clubbing noted Lymphadenopathy: No cervical, preauricular, postauricular or axillary adenopathy is present Skin: Skin is warm and dry. No rash noted. No diaphoresis. No erythema. No pallor. Pscyh: Normal mood and affect. Behavior is normal. Judgment and thought content normal.   LABORATORY RESULTS: No results found for this or any previous visit  (from the past 48 hour(s)).  RADIOLOGY RESULTS: No results found.  Problem List: Patient Active Problem List  Diagnosis  . HYPERLIPIDEMIA  . DEPRESSION  . HYPERTENSION  . SINUSITIS, CHRONIC  . ALLERGIC RHINITIS  . GERD  . CONSTIPATION, CHRONIC  . HEMATOCHEZIA  . CHEST PAIN, ATYPICAL  . Inflamed external hemorrhoid    Assessment & Plan: Obesity and multiple comorbidities  Workup for Lap Roux en Y gastric bypass    Matt B. Daphine Deutscher, MD, [facility] [phone] beeper [phone]  12/07/2011 4:07 PM

## 2011-12-13 ENCOUNTER — Other Ambulatory Visit: Payer: Self-pay

## 2011-12-13 ENCOUNTER — Ambulatory Visit (HOSPITAL_COMMUNITY)
Admission: RE | Admit: 2011-12-13 | Discharge: 2011-12-13 | Disposition: A | Discharge status: Home / Self Care | Payer: BC Managed Care – PPO | Source: Ambulatory Visit | Attending: Surgery | Admitting: Surgery

## 2011-12-13 DIAGNOSIS — K219 Gastro-esophageal reflux disease without esophagitis: Secondary | ICD-10-CM | POA: Insufficient documentation

## 2011-12-13 DIAGNOSIS — K921 Melena: Secondary | ICD-10-CM | POA: Insufficient documentation

## 2011-12-13 DIAGNOSIS — K449 Diaphragmatic hernia without obstruction or gangrene: Secondary | ICD-10-CM | POA: Insufficient documentation

## 2011-12-13 DIAGNOSIS — E669 Obesity, unspecified: Secondary | ICD-10-CM

## 2011-12-13 DIAGNOSIS — I10 Essential (primary) hypertension: Secondary | ICD-10-CM | POA: Insufficient documentation

## 2011-12-13 DIAGNOSIS — Z6836 Body mass index (BMI) 36.0-36.9, adult: Secondary | ICD-10-CM | POA: Insufficient documentation

## 2011-12-13 DIAGNOSIS — E785 Hyperlipidemia, unspecified: Secondary | ICD-10-CM | POA: Insufficient documentation

## 2011-12-17 ENCOUNTER — Telehealth (INDEPENDENT_AMBULATORY_CARE_PROVIDER_SITE_OTHER): Payer: Self-pay | Admitting: General Surgery

## 2011-12-17 NOTE — Telephone Encounter (Signed)
LMOM letting pt know I received her question and that she would need to call the people who do the breath tek testing to ask them that question.  Told her she could call [phone] to ask.

## 2011-12-20 ENCOUNTER — Encounter: Payer: BC Managed Care – PPO | Attending: Surgery | Admitting: *Deleted

## 2011-12-20 ENCOUNTER — Encounter: Payer: Self-pay | Admitting: *Deleted

## 2011-12-20 DIAGNOSIS — Z01818 Encounter for other preprocedural examination: Secondary | ICD-10-CM | POA: Insufficient documentation

## 2011-12-20 DIAGNOSIS — Z713 Dietary counseling and surveillance: Secondary | ICD-10-CM | POA: Insufficient documentation

## 2011-12-20 NOTE — Patient Instructions (Addendum)
   Follow Pre-Op Nutrition Goals to prepare for Gastric Bypass Surgery.   Call the [facility] at [phone] once you have been given your surgery date to enrolled in the Pre-Op Nutrition Class. You will need to attend this nutrition class 3-4 weeks prior to your surgery. 

## 2011-12-20 NOTE — Progress Notes (Signed)
  Pre-Op Assessment Visit:  Pre-Operative RYGB Surgery  Medical Nutrition Therapy:  Appt start time: 1030   End time:  1130.  Patient was seen on 12/20/2011 for Pre-Operative RYGB Nutrition Assessment. Assessment and letter of approval faxed to [facility] Bariatric Surgery Program coordinator on 12/20/2011.  Approval letter sent to [facility] and will be available in the chart under the media tab.  Handouts given during visit include:  Pre-Op Goals   Bariatric Surgery Protein Shakes handout  Patient to call for Pre-Op and Post-Op Nutrition Education at the [facility] when surgery is scheduled.

## 2011-12-21 ENCOUNTER — Ambulatory Visit (HOSPITAL_COMMUNITY)
Admission: RE | Admit: 2011-12-21 | Discharge: 2011-12-21 | Disposition: A | Discharge status: Home / Self Care | Payer: BC Managed Care – PPO | Source: Ambulatory Visit | Attending: Surgery | Admitting: Surgery

## 2011-12-21 ENCOUNTER — Encounter (HOSPITAL_COMMUNITY)
Admission: RE | Disposition: A | Discharge status: Home / Self Care | Payer: Self-pay | Source: Ambulatory Visit | Attending: Surgery

## 2011-12-21 DIAGNOSIS — Z01818 Encounter for other preprocedural examination: Secondary | ICD-10-CM | POA: Insufficient documentation

## 2011-12-21 HISTORY — PX: BREATH TEK H PYLORI: SHX5422

## 2011-12-21 SURGERY — BREATH TEST, FOR HELICOBACTER PYLORI

## 2011-12-24 ENCOUNTER — Encounter: Payer: Self-pay | Admitting: Family Medicine

## 2011-12-24 ENCOUNTER — Encounter (HOSPITAL_COMMUNITY): Payer: Self-pay | Admitting: Surgery

## 2011-12-24 ENCOUNTER — Encounter (HOSPITAL_COMMUNITY): Payer: Self-pay

## 2012-01-10 ENCOUNTER — Other Ambulatory Visit: Payer: Self-pay | Admitting: Family Medicine

## 2012-01-15 NOTE — Telephone Encounter (Signed)
Refill for 6 months. 

## 2012-01-15 NOTE — Telephone Encounter (Signed)
Pt had CPX is Sept, alprazolam last filled 07-02-11, #30 with 5 refills

## 2012-01-25 ENCOUNTER — Ambulatory Visit: Payer: BC Managed Care – PPO | Admitting: Family Medicine

## 2012-02-04 ENCOUNTER — Encounter: Payer: Self-pay | Admitting: Family Medicine

## 2012-02-04 ENCOUNTER — Ambulatory Visit (INDEPENDENT_AMBULATORY_CARE_PROVIDER_SITE_OTHER): Payer: BC Managed Care – PPO | Admitting: Family Medicine

## 2012-02-04 VITALS — BP 142/98 | Temp 97.6°F | Wt 187.0 lb

## 2012-02-04 DIAGNOSIS — K219 Gastro-esophageal reflux disease without esophagitis: Secondary | ICD-10-CM

## 2012-02-04 DIAGNOSIS — I10 Essential (primary) hypertension: Secondary | ICD-10-CM

## 2012-02-04 DIAGNOSIS — E669 Obesity, unspecified: Secondary | ICD-10-CM

## 2012-02-04 MED ORDER — LISINOPRIL-HYDROCHLOROTHIAZIDE 10-12.5 MG PO TABS: 1.0000 | ORAL_TABLET | Freq: Every day | ORAL | Status: DC

## 2012-02-04 NOTE — Progress Notes (Signed)
  Subjective:    Patient ID: Debbie Werner, female    DOB: 1956/02/09, 56 y.o.   MRN: [id number]  HPI  Patient here to discuss that she was recently denied bariatric surgery. She has BMI around 37. Her comorbidities include GERD, osteoarthritis especially involving knees, hypertension, and dyslipidemia. She takes lisinopril for hypertension and blood pressure has recently been poorly controlled with several systolics around 150 and diastolics in the 90s. Compliant with therapy. No headaches. No chest pains.  Dyslipidemia. Stopped pravastatin secondary to myalgias. She's tried multiple dietary plans. She has seen nutritionist. She went to a clinic that prescribed phentermine for several years but had to discontinue because of some blood pressure instability. She still has breakthrough heartburn symptoms on proton pump inhibitor. She has bilateral knee pains and stiffness which has been progressive.  Past Medical History  Diagnosis Date  . ALLERGIC RHINITIS 08/03/2008  . CHEST PAIN, ATYPICAL 10/12/2008  . CONSTIPATION, CHRONIC 12/08/2009  . DEPRESSION 12/08/2009  . GERD 08/03/2008  . HEMATOCHEZIA 05/02/2009  . HYPERLIPIDEMIA 08/03/2008  . HYPERTENSION 08/03/2008  . SINUSITIS, CHRONIC 02/24/2009  . Gestational diabetes   . Obesity   . Cancer     Skin - has had "spots" removed for years   Past Surgical History  Procedure Date  . Breast surgery     bx  . Cystectomy 1996    ovary  . Tubal ligation   . Breath tek h pylori 12/21/2011    Procedure: BREATH TEK H PYLORI;  Surgeon: Valarie Merino, MD;  Location: [facility];  Service: General;  Laterality: N/A;  Katy/Andrea    reports that she has never smoked. She does not have any smokeless tobacco history on file. She reports that she does not drink alcohol or use illicit drugs. family history includes Heart disease (age of onset:62) in her father.  There is no history of Hyperlipidemia, and Hypertension, and Stroke, and Diabetes, . Allergies   Allergen Reactions  . Anesthetics, Amide Nausea Only  . Pravastatin Sodium     REACTION: effects brain memory  . Sulfadiazine     GI upset      Review of Systems  Constitutional: Positive for fatigue.  Eyes: Negative for visual disturbance.  Respiratory: Negative for cough, chest tightness, shortness of breath and wheezing.   Cardiovascular: Negative for chest pain, palpitations and leg swelling.  Musculoskeletal: Positive for arthralgias.  Neurological: Negative for dizziness, seizures, syncope, weakness, light-headedness and headaches.       Objective:   Physical Exam  Constitutional: She appears well-developed and well-nourished.  Cardiovascular: Normal rate and regular rhythm.   Pulmonary/Chest: Effort normal and breath sounds normal. No respiratory distress. She has no wheezes. She has no rales.  Musculoskeletal: She exhibits no edema.       Patient has some crepitus with flexion-extension right knee.          Assessment & Plan:  #1 hypertension. Poorly controlled. Change lisinopril to lisinopril HCTZ 10/12.5 one daily. Reassess blood pressure one month #2 obesity. Multiple comorbidities including hypertension, hyperlipidemia, osteoarthritis, and GERD. Hypertension and lipids are poorly controlled. She has already resubmitted appeal for consideration of gastric bypass surgery.  She has tried multiple programs previously without success.

## 2012-03-13 ENCOUNTER — Telehealth: Payer: Self-pay | Admitting: *Deleted

## 2012-03-13 MED ORDER — LISINOPRIL 20 MG PO TABS: 20.0000 mg | ORAL_TABLET | Freq: Every day | ORAL | Status: DC

## 2012-03-13 NOTE — Telephone Encounter (Signed)
D/C lisinopril HCTZ and start Lisinopril 20 mg daily.

## 2012-03-13 NOTE — Telephone Encounter (Signed)
Pt here with her husband who had and OV.  Pt reporting she had trouble with taking the new med for her BP, lisinopril with HCTZ.  Requesting she go back on earlier med for BP.

## 2012-03-13 NOTE — Telephone Encounter (Signed)
Patient is aware, Rx sent to pharmacy, added to med list.

## 2012-04-05 ENCOUNTER — Other Ambulatory Visit: Payer: Self-pay

## 2012-05-29 ENCOUNTER — Ambulatory Visit (INDEPENDENT_AMBULATORY_CARE_PROVIDER_SITE_OTHER): Payer: BC Managed Care – PPO | Admitting: Family Medicine

## 2012-05-29 VITALS — BP 110/80 | Temp 98.3°F | Wt 178.0 lb

## 2012-05-29 DIAGNOSIS — M7711 Lateral epicondylitis, right elbow: Secondary | ICD-10-CM

## 2012-05-29 DIAGNOSIS — H9209 Otalgia, unspecified ear: Secondary | ICD-10-CM

## 2012-05-29 DIAGNOSIS — H9202 Otalgia, left ear: Secondary | ICD-10-CM

## 2012-05-29 DIAGNOSIS — M7710 Lateral epicondylitis, unspecified elbow: Secondary | ICD-10-CM

## 2012-05-29 NOTE — Patient Instructions (Addendum)
Lateral Epicondylitis (Tennis Elbow) with Rehab Lateral epicondylitis involves inflammation and pain around the outer portion of the elbow. The pain is caused by inflammation of the tendons in the forearm that bring back (extend) the wrist. Lateral epicondylittis is also called tennis elbow, because it is very common in tennis players. However, it may occur in any individual who extends the wrist repetitively. If lateral epicondylitis is left untreated, it may become a chronic problem. SYMPTOMS   Pain, tenderness, and inflammation on the outer (lateral) side of the elbow.  Pain or weakness with gripping activities.  Pain that increases with wrist twisting motions (playing tennis, using a screwdriver, opening a door or a jar).  Pain with lifting objects, including a coffee cup. CAUSES  Lateral epicondylitis is caused by inflammation of the tendons that extend the wrist. Causes of injury may include:  Repetitive stress and strain on the muscles and tendons that extend the wrist.  Sudden change in activity level or intensity.  Incorrect grip in racquet sports.  Incorrect grip size of racquet (often too large).  Incorrect hitting position or technique (usually backhand, leading with the elbow).  Using a racket that is too heavy. RISK INCREASES WITH:  Sports or occupations that require repetitive and/or strenuous forearm and wrist movements (tennis, squash, racquetball, carpentry).  Poor wrist and forearm strength and flexibility.  Failure to warm up properly before activity.  Resuming activity before healing, rehabilitation, and conditioning are complete. PREVENTION   Warm up and stretch properly before activity.  Maintain physical fitness:  Strength, flexibility, and endurance.  Cardiovascular fitness.  Wear and use properly fitted equipment.  Learn and use proper technique and have a coach correct improper technique.  Wear a tennis elbow (counterforce) brace. PROGNOSIS   The course of this condition depends on the degree of the injury. If treated properly, acute cases (symptoms lasting less than 4 weeks) are often resolved in 2 to 6 weeks. Chronic (longer lasting cases) often resolve in 3 to 6 months, but may require physical therapy. RELATED COMPLICATIONS   Frequently recurring symptoms, resulting in a chronic problem. Properly treating the problem the first time decreases frequency of recurrence.  Chronic inflammation, scarring tendon degeneration, and partial tendon tear, requiring surgery.  Delayed healing or resolution of symptoms. TREATMENT  Treatment first involves the use of ice and medicine, to reduce pain and inflammation. Strengthening and stretching exercises may help reduce discomfort, if performed regularly. These exercises may be performed at home, if the condition is an acute injury. Chronic cases may require a referral to a physical therapist for evaluation and treatment. Your caregiver may advise a corticosteroid injection, to help reduce inflammation. Rarely, surgery is needed. MEDICATION  If pain medicine is needed, nonsteroidal anti-inflammatory medicines (aspirin and ibuprofen), or other minor pain relievers (acetaminophen), are often advised.  Do not take pain medicine for 7 days before surgery.  Prescription pain relievers may be given, if your caregiver thinks they are needed. Use only as directed and only as much as you need.  Corticosteroid injections may be recommended. These injections should be reserved only for the most severe cases, because they can only be given a certain number of times. HEAT AND COLD  Cold treatment (icing) should be applied for 10 to 15 minutes every 2 to 3 hours for inflammation and pain, and immediately after activity that aggravates your symptoms. Use ice packs or an ice massage.  Heat treatment may be used before performing stretching and strengthening activities prescribed by your   caregiver, physical  therapist, or athletic trainer. Use a heat pack or a warm water soak. SEEK MEDICAL CARE IF: Symptoms get worse or do not improve in 2 weeks, despite treatment. EXERCISES  RANGE OF MOTION (ROM) AND STRETCHING EXERCISES - Epicondylitis, Lateral (Tennis Elbow) These exercises may help you when beginning to rehabilitate your injury. Your symptoms may go away with or without further involvement from your physician, physical therapist or athletic trainer. While completing these exercises, remember:   Restoring tissue flexibility helps normal motion to return to the joints. This allows healthier, less painful movement and activity.  An effective stretch should be held for at least 30 seconds.  A stretch should never be painful. You should only feel a gentle lengthening or release in the stretched tissue. RANGE OF MOTION  Wrist Flexion, Active-Assisted  Extend your right / left elbow with your fingers pointing down.*  Gently pull the back of your hand towards you, until you feel a gentle stretch on the top of your forearm.  Hold this position for __________ seconds. Repeat __________ times. Complete this exercise __________ times per day.  *If directed by your physician, physical therapist or athletic trainer, complete this stretch with your elbow bent, rather than extended. RANGE OF MOTION  Wrist Extension, Active-Assisted  Extend your right / left elbow and turn your palm upwards.*  Gently pull your palm and fingertips back, so your wrist extends and your fingers point more toward the ground.  You should feel a gentle stretch on the inside of your forearm.  Hold this position for __________ seconds. Repeat __________ times. Complete this exercise __________ times per day. *If directed by your physician, physical therapist or athletic trainer, complete this stretch with your elbow bent, rather than extended. STRETCH - Wrist Flexion  Place the back of your right / left hand on a tabletop,  leaving your elbow slightly bent. Your fingers should point away from your body.  Gently press the back of your hand down onto the table by straightening your elbow. You should feel a stretch on the top of your forearm.  Hold this position for __________ seconds. Repeat __________ times. Complete this stretch __________ times per day.  STRETCH  Wrist Extension   Place your right / left fingertips on a tabletop, leaving your elbow slightly bent. Your fingers should point backwards.  Gently press your fingers and palm down onto the table by straightening your elbow. You should feel a stretch on the inside of your forearm.  Hold this position for __________ seconds. Repeat __________ times. Complete this stretch __________ times per day.  STRENGTHENING EXERCISES - Epicondylitis, Lateral (Tennis Elbow) These exercises may help you when beginning to rehabilitate your injury. They may resolve your symptoms with or without further involvement from your physician, physical therapist or athletic trainer. While completing these exercises, remember:   Muscles can gain both the endurance and the strength needed for everyday activities through controlled exercises.  Complete these exercises as instructed by your physician, physical therapist or athletic trainer. Increase the resistance and repetitions only as guided.  You may experience muscle soreness or fatigue, but the pain or discomfort you are trying to eliminate should never worsen during these exercises. If this pain does get worse, stop and make sure you are following the directions exactly. If the pain is still present after adjustments, discontinue the exercise until you can discuss the trouble with your caregiver. STRENGTH Wrist Flexors  Sit with your right / left forearm palm-up and   fully supported on a table or countertop. Your elbow should be resting below the height of your shoulder. Allow your wrist to extend over the edge of the  surface.  Loosely holding a __________ weight, or a piece of rubber exercise band or tubing, slowly curl your hand up toward your forearm.  Hold this position for __________ seconds. Slowly lower the wrist back to the starting position in a controlled manner. Repeat __________ times. Complete this exercise __________ times per day.  STRENGTH  Wrist Extensors  Sit with your right / left forearm palm-down and fully supported on a table or countertop. Your elbow should be resting below the height of your shoulder. Allow your wrist to extend over the edge of the surface.  Loosely holding a __________ weight, or a piece of rubber exercise band or tubing, slowly curl your hand up toward your forearm.  Hold this position for __________ seconds. Slowly lower the wrist back to the starting position in a controlled manner. Repeat __________ times. Complete this exercise __________ times per day.  STRENGTH - Ulnar Deviators  Stand with a ____________________ weight in your right / left hand, or sit while holding a rubber exercise band or tubing, with your healthy arm supported on a table or countertop.  Move your wrist, so that your pinkie travels toward your forearm and your thumb moves away from your forearm.  Hold this position for __________ seconds and then slowly lower the wrist back to the starting position. Repeat __________ times. Complete this exercise __________ times per day STRENGTH - Radial Deviators  Stand with a ____________________ weight in your right / left hand, or sit while holding a rubber exercise band or tubing, with your injured arm supported on a table or countertop.  Raise your hand upward in front of you or pull up on the rubber tubing.  Hold this position for __________ seconds and then slowly lower the wrist back to the starting position. Repeat __________ times. Complete this exercise __________ times per day. STRENGTH  Forearm Supinators   Sit with your right /  left forearm supported on a table, keeping your elbow below shoulder height. Rest your hand over the edge, palm down.  Gently grip a hammer or a soup ladle.  Without moving your elbow, slowly turn your palm and hand upward to a "thumbs-up" position.  Hold this position for __________ seconds. Slowly return to the starting position. Repeat __________ times. Complete this exercise __________ times per day.  STRENGTH  Forearm Pronators   Sit with your right / left forearm supported on a table, keeping your elbow below shoulder height. Rest your hand over the edge, palm up.  Gently grip a hammer or a soup ladle.  Without moving your elbow, slowly turn your palm and hand upward to a "thumbs-up" position.  Hold this position for __________ seconds. Slowly return to the starting position. Repeat __________ times. Complete this exercise __________ times per day.  STRENGTH - Grip  Grasp a tennis ball, a dense sponge, or a large, rolled sock in your hand.  Squeeze as hard as you can, without increasing any pain.  Hold this position for __________ seconds. Release your grip slowly. Repeat __________ times. Complete this exercise __________ times per day.  STRENGTH - Elbow Extensors, Isometric  Stand or sit upright, on a firm surface. Place your right / left arm so that your palm faces your stomach, and it is at the height of your waist.  Place your opposite hand on   the underside of your forearm. Gently push up as your right / left arm resists. Push as hard as you can with both arms, without causing any pain or movement at your right / left elbow. Hold this stationary position for __________ seconds. Gradually release the tension in both arms. Allow your muscles to relax completely before repeating. Document Released: 02/05/2005 Document Revised: 04/30/2011 Document Reviewed: 05/20/2008 [employer] Patient Information 2013 [employer].  

## 2012-05-29 NOTE — Progress Notes (Signed)
  Subjective:    Patient ID: Debbie Werner, female    DOB: 05-Nov-1955, 57 y.o.   MRN: [id number]  HPI Patient seen with right lateral elbow pain Onset several months ago. She denies any history of injury. She thinks it started around September of 2013. She plays frequent golf but no tennis. Ice makes worse. Heat helps temporarily. Symptoms worse with gripping. She does a lot of typing. Tried tennis elbow strap without much improvement.  Also complains of some poorly localized left ear pain. No hearing changes. No ear drainage. No facial rashes. No pain with chewing. Denies sore throat. No adenopathy. Symptoms relatively mild  Past Medical History  Diagnosis Date  . ALLERGIC RHINITIS 08/03/2008  . CHEST PAIN, ATYPICAL 10/12/2008  . CONSTIPATION, CHRONIC 12/08/2009  . DEPRESSION 12/08/2009  . GERD 08/03/2008  . HEMATOCHEZIA 05/02/2009  . HYPERLIPIDEMIA 08/03/2008  . HYPERTENSION 08/03/2008  . SINUSITIS, CHRONIC 02/24/2009  . Gestational diabetes   . Obesity   . Cancer     Skin - has had "spots" removed for years   Past Surgical History  Procedure Laterality Date  . Breast surgery      bx  . Cystectomy  1996    [facility]  . Tubal ligation    . Breath tek h pylori  12/21/2011    Procedure: BREATH TEK H PYLORI;  Surgeon: Valarie Merino, MD;  Location: [facility];  Service: General;  Laterality: N/A;  Katy/Andrea    reports that she has never smoked. She does not have any smokeless tobacco history on file. She reports that she does not drink alcohol or use illicit drugs. family history includes Heart disease (age of onset: 16) in her father.  There is no history of Hyperlipidemia, and Hypertension, and Stroke, and Diabetes, . Allergies  Allergen Reactions  . Anesthetics, Amide Nausea Only  . Pravastatin Sodium     REACTION: effects brain memory  . Sulfadiazine     GI upset      Review of Systems  Constitutional: Negative for fever, chills, appetite change and unexpected  weight change.  HENT: Positive for ear pain. Negative for sore throat, trouble swallowing and voice change.   Neurological: Negative for weakness and numbness.  Hematological: Negative for adenopathy.       Objective:   Physical Exam  Constitutional: She appears well-developed and well-nourished.  HENT:  Right Ear: External ear normal.  Left Ear: External ear normal.  Mouth/Throat: Oropharynx is clear and moist.  Neck: Neck supple. No thyromegaly present.  Cardiovascular: Normal rate and regular rhythm.   Pulmonary/Chest: Effort normal and breath sounds normal. No respiratory distress. She has no wheezes. She has no rales.  Musculoskeletal:  Right elbow full range of motion. Localized tenderness lateral epicondylar region. No biceps tenderness. Pain with wrist extension against resistance but not with wrist flexion. No pain with supination or pronation  Lymphadenopathy:    She has no cervical adenopathy.          Assessment & Plan:  #1 right lateral epicondylitis. Several months duration. Not improved with conservative management. Discussed risk and benefits of corticosteroid injection. Patient consented. Right elbow prepped with Betadine. Injected 40 mg Depo-Medrol and 1 cc of plain Xylocaine using 25-gauge 1 inch needle into the area of maximal tenderness. Patient tolerated well. Touch base 2-3 weeks if no improvement  #2 left otalgia. Normal exam. Reassurance and follow for now

## 2012-07-21 ENCOUNTER — Other Ambulatory Visit: Payer: Self-pay | Admitting: Family Medicine

## 2012-07-23 NOTE — Telephone Encounter (Signed)
Refill OK

## 2012-08-23 ENCOUNTER — Other Ambulatory Visit: Payer: Self-pay | Admitting: Family Medicine

## 2012-08-25 NOTE — Telephone Encounter (Signed)
eScribe request for refill on Alprazolam Last filled - 06.02.14, #[id number] Last AEX - 04.10.14 Please advise on refills/SLS

## 2012-08-26 NOTE — Telephone Encounter (Signed)
Refill for 6 months. 

## 2012-09-23 ENCOUNTER — Other Ambulatory Visit: Payer: Self-pay | Admitting: Family Medicine

## 2012-11-25 ENCOUNTER — Ambulatory Visit: Payer: BC Managed Care – PPO | Admitting: Family Medicine

## 2012-12-04 ENCOUNTER — Encounter: Payer: Self-pay | Admitting: Family Medicine

## 2012-12-04 ENCOUNTER — Ambulatory Visit (INDEPENDENT_AMBULATORY_CARE_PROVIDER_SITE_OTHER): Payer: BC Managed Care – PPO | Admitting: Family Medicine

## 2012-12-04 VITALS — BP 132/84 | HR 84 | Temp 97.4°F | Wt 182.0 lb

## 2012-12-04 DIAGNOSIS — E669 Obesity, unspecified: Secondary | ICD-10-CM

## 2012-12-04 DIAGNOSIS — E785 Hyperlipidemia, unspecified: Secondary | ICD-10-CM

## 2012-12-04 DIAGNOSIS — I10 Essential (primary) hypertension: Secondary | ICD-10-CM

## 2012-12-04 LAB — LIPID PANEL
Cholesterol: 288 mg/dL — ABNORMAL HIGH (ref 0–200)
VLDL: 35.6 mg/dL (ref 0.0–40.0)

## 2012-12-04 LAB — BASIC METABOLIC PANEL
BUN: 13 mg/dL (ref 6–23)
Calcium: 9.5 mg/dL (ref 8.4–10.5)
GFR: 59.73 mL/min — ABNORMAL LOW (ref >60.00)
Potassium: 4 mEq/L (ref 3.5–5.1)
Sodium: 141 mEq/L (ref 135–145)

## 2012-12-04 MED ORDER — LORCASERIN HCL 10 MG PO TABS: 1.0000 | ORAL_TABLET | Freq: Two times a day (BID) | ORAL | Status: DC

## 2012-12-04 NOTE — Progress Notes (Signed)
  Subjective:    Patient ID: Debbie Werner, female    DOB: [birth year], 57 y.o.   MRN: [id number]  HPI  Patient here for followup She has history of obesity, GERD, hypertension, hyperlipidemia. Intolerant of multiple statins. She recently went for evaluation for possible gastric bypass but was turned down by insurance. Her blood pressure is well controlled lisinopril. She is exercising, though somewhat inconsistently. She's been compliant with diet. She specifically is requesting trial of Belviq.   She has sertraline listed on medication list but s actually not taking this.  Denies any recent chest pains, dizziness, headaches. Nonsmoker. GERD symptoms well controlled with omeprazole.  Past Medical History  Diagnosis Date  . ALLERGIC RHINITIS 08/03/2008  . CHEST PAIN, ATYPICAL 10/12/2008  . CONSTIPATION, CHRONIC 12/08/2009  . DEPRESSION 12/08/2009  . GERD 08/03/2008  . HEMATOCHEZIA 05/02/2009  . HYPERLIPIDEMIA 08/03/2008  . HYPERTENSION 08/03/2008  . SINUSITIS, CHRONIC 02/24/2009  . Gestational diabetes   . Obesity   . Cancer     Skin - has had "spots" removed for years   Past Surgical History  Procedure Laterality Date  . Breast surgery      bx  . Cystectomy  1996    [facility]  . Tubal ligation    . Breath tek h pylori  12/21/2011    Procedure: BREATH TEK H PYLORI;  Surgeon: Valarie Merino, MD;  Location: [facility];  Service: General;  Laterality: N/A;  Katy/Andrea    reports that she has never smoked. She does not have any smokeless tobacco history on file. She reports that she does not drink alcohol or use illicit drugs. family history includes Heart disease (age of onset: 62) in her father. There is no history of Hyperlipidemia, Hypertension, Stroke, or Diabetes. Allergies  Allergen Reactions  . Anesthetics, Amide Nausea Only  . Pravastatin Sodium     REACTION: effects brain memory  . Sulfadiazine     GI upset     Review of Systems  Constitutional: Negative for fever,  appetite change, fatigue and unexpected weight change.  Respiratory: Negative for cough and shortness of breath.   Cardiovascular: Negative for chest pain.  Gastrointestinal: Negative for abdominal pain.  Endocrine: Negative for polydipsia and polyuria.  Neurological: Negative for dizziness and headaches.       Objective:   Physical Exam  Constitutional: She is oriented to person, place, and time. She appears well-developed and well-nourished.  HENT:  Mouth/Throat: Oropharynx is clear and moist.  Neck: Neck supple. No thyromegaly present.  Cardiovascular: Normal rate and regular rhythm.   Pulmonary/Chest: Effort normal and breath sounds normal. No respiratory distress. She has no wheezes. She has no rales.  Musculoskeletal: She exhibits no edema.  Lymphadenopathy:    She has no cervical adenopathy.  Neurological: She is alert and oriented to person, place, and time. No cranial nerve deficit.          Assessment & Plan:  #1 obesity. We spent quite some time discussing weight loss strategies. We emphasized the foundation of exercise and diet. Trial of Belviq 10 mg twice daily and reassess in 3 months. We reviewed possible side effects. Avoid any use of sertraline #2 hypertension. Well controlled #3 hyperlipidemia. Repeat lipid panel

## 2012-12-04 NOTE — Patient Instructions (Addendum)
Lorcaserin oral tablets What is this medicine? LORCASERIN (lor ca SER in) is used to promote and maintain weight loss in obese patients. This medicine should be used with a reduced calorie diet and, if appropriate, an exercise program. This medicine may be used for other purposes; ask your health care provider or pharmacist if you have questions. What should I tell my health care provider before I take this medicine? They need to know if you have any of these conditions: -anatomical deformation of the penis, Peyronie's disease, or history of priapism (painful and prolonged erection) -diabetes -heart disease -history of blood diseases, like sickle cell anemia or leukemia -history of irregular heartbeat -kidney disease -liver disease -suicidal thoughts, plans, or attempt; a previous suicide attempt by you or a family member -an unusual or allergic reaction to lorcaserin, other medicines, foods, dyes, or preservatives -pregnant or trying to get pregnant -breast-feeding How should I use this medicine? Take this medicine by mouth with a glass of water. Follow the directions on the prescription label. You can take it with or without food. Take your medicine at regular intervals. Do not take it more often than directed. Do not stop taking except on your doctor's advice. Talk to your pediatrician regarding the use of this medicine in children. Special care may be needed. Overdosage: If you think you've taken too much of this medicine contact a poison control center or emergency room at once. Overdosage: If you think you have taken too much of this medicine contact a poison control center or emergency room at once. NOTE: This medicine is only for you. Do not share this medicine with others. What if I miss a dose? If you miss a dose, take it as soon as you can. If it is almost time for your next dose, take only that dose. Do not take double or extra doses. What may interact with this  medicine? -cabergoline -certain medicines for depression, anxiety, or psychotic disturbances -certain medicines for erectile dysfunction -certain medicines for migraine headache like almotriptan, eletriptan, frovatriptan, naratriptan, rizatriptan, sumatriptan, zolmitriptan -dextromethorphan -linezolid -MAOIs like Carbex, Eldepryl, Marplan, Nardil, and Parnate -medicines for diabetes -orlistat -tramadol -St. John's Wort -stimulant medicines for attention disorders, weight loss, or to stay awake This list may not describe all possible interactions. Give your health care provider a list of all the medicines, herbs, non-prescription drugs, or dietary supplements you use. Also tell them if you smoke, drink alcohol, or use illegal drugs. Some items may interact with your medicine. What should I watch for while using this medicine? This medicine is intended to be used in addition to a healthy diet and appropriate exercise. The best results are achieved this way. Do not increase or in any way change your dose without consulting your doctor or health care professional. Your doctor should tell you to stop taking this medicine if you do not lose a certain amount of weight within the first 12 weeks of treatment. Visit your doctor or health care professional for regular checkups. Your doctor may order blood tests or other tests to see how you are doing. Do not drive, use machinery, or do anything that needs mental alertness until you know how this medicine affects you. This medicine may affect blood sugar levels. If you have diabetes, check with your doctor or health care professional before you change your diet or the dose of your diabetic medicine. Patients and their families should watch out for worsening depression or thoughts of suicide. Also watch out for sudden  changes in feelings such as feeling anxious, agitated, panicky, irritable, hostile, aggressive, impulsive, severely restless, overly excited and  hyperactive, or not being able to sleep. If this happens, especially at the beginning of treatment or after a change in dose, call your health care professional. Contact you doctor or health care professional right away if the erection lasts longer than 4 hours or if it becomes painful. This may be a sign of serious problem and must be treated right away to prevent permanent damage. What side effects may I notice from receiving this medicine? Side effects that you should report to your doctor or health care professional as soon as possible: -allergic reactions like skin rash, itching or hives, swelling of the face, lips, or tongue -abnormal production of milk -breast enlargement in both males and females -breathing problems -changes in emotions or moods -changes in vision -confusion -erection lasting more than 4 hours -fast or irregular heart beat -feeling faint or lightheaded, falls -fever or chills, sore throat -hallucination, loss of contact with reality -high or low blood pressure -menstrual changes -restlessness -seizures -slow or irregular heartbeat -stiff muscles -sweating -suicidal thoughts or other mood changes -tremors -trouble walking -unusually weak or tired -vomiting  Side effects that usually do not require medical attention (Report these to your doctor or health care professional if they continue or are bothersome.): -back pain -constipation -cough -dizziness -dry mouth -low blood sugar if you have diabetes (ask your doctor or healthcare professional for a list of these symptoms) -nausea -tiredness This list may not describe all possible side effects. Call your doctor for medical advice about side effects. You may report side effects to FDA at 1-800-FDA-1088. Where should I keep my medicine? Keep out of the reach of children. Store at room temperature between 15 and 30 degrees C (59 and 86 degrees F). Throw away any unused medicine after the expiration  date. NOTE: This sheet is a summary. It may not cover all possible information. If you have questions about this medicine, talk to your doctor, pharmacist, or health care provider.  2013, [employer]vier/Gold Standard. (08/22/2010 5:15:17 PM)   Livalo 4 mg- take ONE HALF tablet daily and repeat lipids in 6 weeks.

## 2013-02-24 ENCOUNTER — Other Ambulatory Visit: Payer: Self-pay | Admitting: Family Medicine

## 2013-02-25 NOTE — Telephone Encounter (Signed)
Last visit 12-04-12 Last refill 08/23/12 #30 5 refill

## 2013-02-25 NOTE — Telephone Encounter (Signed)
Refill for 6 months. 

## 2013-03-18 ENCOUNTER — Encounter: Payer: Self-pay | Admitting: *Deleted

## 2013-03-19 ENCOUNTER — Other Ambulatory Visit: Payer: Self-pay | Admitting: Family Medicine

## 2013-03-19 ENCOUNTER — Other Ambulatory Visit (INDEPENDENT_AMBULATORY_CARE_PROVIDER_SITE_OTHER): Payer: BC Managed Care – PPO

## 2013-03-19 DIAGNOSIS — E785 Hyperlipidemia, unspecified: Secondary | ICD-10-CM

## 2013-03-19 LAB — HEPATIC FUNCTION PANEL
ALK PHOS: 84 U/L (ref 39–117)
ALT: 28 U/L (ref 0–35)
AST: 22 U/L (ref 0–37)
Albumin: 3.7 g/dL (ref 3.5–5.2)
BILIRUBIN DIRECT: 0.1 mg/dL (ref 0.0–0.3)
TOTAL PROTEIN: 6.9 g/dL (ref 6.0–8.3)
Total Bilirubin: 0.7 mg/dL (ref 0.3–1.2)

## 2013-03-19 LAB — LIPID PANEL
CHOLESTEROL: 263 mg/dL — AB (ref 0–200)
HDL: 35.8 mg/dL — AB (ref >39.00)
Total CHOL/HDL Ratio: 7
Triglycerides: 174 mg/dL — ABNORMAL HIGH (ref 0.0–149.0)
VLDL: 34.8 mg/dL (ref 0.0–40.0)

## 2013-03-19 LAB — LDL CHOLESTEROL, DIRECT: Direct LDL: 203.7 mg/dL

## 2013-03-19 LAB — HM MAMMOGRAPHY: HM Mammogram: NEGATIVE

## 2013-03-20 ENCOUNTER — Encounter: Payer: Self-pay | Admitting: Family Medicine

## 2013-03-24 ENCOUNTER — Encounter: Payer: Self-pay | Admitting: Family Medicine

## 2013-03-24 ENCOUNTER — Ambulatory Visit (INDEPENDENT_AMBULATORY_CARE_PROVIDER_SITE_OTHER): Payer: BC Managed Care – PPO | Admitting: Family Medicine

## 2013-03-24 VITALS — BP 134/80 | HR 94 | Temp 97.3°F | Wt 185.0 lb

## 2013-03-24 DIAGNOSIS — G4700 Insomnia, unspecified: Secondary | ICD-10-CM

## 2013-03-24 DIAGNOSIS — E669 Obesity, unspecified: Secondary | ICD-10-CM

## 2013-03-24 DIAGNOSIS — F5104 Psychophysiologic insomnia: Secondary | ICD-10-CM

## 2013-03-24 DIAGNOSIS — E785 Hyperlipidemia, unspecified: Secondary | ICD-10-CM

## 2013-03-24 MED ORDER — PHENTERMINE-TOPIRAMATE ER 3.75-23 MG PO CP24: 1.0000 | ORAL_CAPSULE | Freq: Every day | ORAL | Status: DC

## 2013-03-24 MED ORDER — PHENTERMINE-TOPIRAMATE ER 7.5-46 MG PO CP24: 1.0000 | ORAL_CAPSULE | Freq: Every day | ORAL | Status: DC

## 2013-03-24 NOTE — Progress Notes (Signed)
Pre visit review using our clinic review tool, if applicable. No additional management support is needed unless otherwise documented below in the visit note. 

## 2013-03-24 NOTE — Progress Notes (Signed)
Subjective:    Patient ID: Debbie Werner, female    DOB: May 30, 1955, 58 y.o.   MRN: [id number]  HPI Patient here to discuss multiple issues. Her chronic problems include history of hypertension, dyslipidemia, obesity, history of depression, GERD. She has had concern for quite some time about weight issues. She's had difficulty losing weight even with low calorie diets. We recently tried Belviq but she had actually increased appetite and stopped this after several days. She is not consistently exercising though we have discussed this previously. She is trying to follow a low-calorie diet.  She has some chronic insomnia and has used alprazolam as needed. No alcohol use. No late day use of caffeine. Denies any current depression issues  Patient has hyperlipidemia. We recently tried livalo 2 mg daily which she has tolerated well. Recent lipids reviewed and still very poorly controlled with LDL over 200 though she ran out of her statin medication about 2 weeks prior to these labs. She did not have any associated myalgias.  Past Medical History  Diagnosis Date  . ALLERGIC RHINITIS 08/03/2008  . CHEST PAIN, ATYPICAL 10/12/2008  . CONSTIPATION, CHRONIC 12/08/2009  . DEPRESSION 12/08/2009  . GERD 08/03/2008  . HEMATOCHEZIA 05/02/2009  . HYPERLIPIDEMIA 08/03/2008  . HYPERTENSION 08/03/2008  . SINUSITIS, CHRONIC 02/24/2009  . Gestational diabetes   . Obesity   . Cancer     Skin - has had "spots" removed for years   Past Surgical History  Procedure Laterality Date  . Breast surgery      bx  . Cystectomy  1996    [facility]  . Tubal ligation    . Breath tek h pylori  12/21/2011    Procedure: BREATH TEK H PYLORI;  Surgeon: Pedro Earls, MD;  Location: [facility];  Service: General;  Laterality: N/A;  Katy/Andrea    reports that she has never smoked. She does not have any smokeless tobacco history on file. She reports that she does not drink alcohol or use illicit drugs. family history includes  Heart disease (age of onset: 61) in her father. There is no history of Hyperlipidemia, Hypertension, Stroke, or Diabetes. Allergies  Allergen Reactions  . Anesthetics, Amide Nausea Only  . Pravastatin Sodium     REACTION: effects brain memory  . Sulfadiazine     GI upset  ]'    Review of Systems  Constitutional: Negative for fever, chills, appetite change, fatigue and unexpected weight change.  Eyes: Negative for visual disturbance.  Respiratory: Negative for cough, chest tightness, shortness of breath and wheezing.   Cardiovascular: Negative for chest pain, palpitations and leg swelling.  Endocrine: Negative for polydipsia and polyuria.  Neurological: Negative for dizziness, seizures, syncope, weakness, light-headedness and headaches.       Objective:   Physical Exam  Constitutional: She appears well-developed and well-nourished.  Neck: Neck supple. No thyromegaly present.  Cardiovascular: Normal rate.   Pulmonary/Chest: Effort normal and breath sounds normal. No respiratory distress. She has no wheezes. She has no rales.  Musculoskeletal: She exhibits no edema.          Assessment & Plan:  #1 chronic insomnia. Sleep hygiene discussed. We recommend not adding additional medication this time #2 hyperlipidemia. Increase livalo to 4 mg once daily. Reassess lipid and hepatic in 3 months #3 obesity. We discussed at some length strategies for weight loss. Trial of Qsymia 3.75 mg a day initially for 14 days then titrate to 7.5 mg dose. Reviewed possible side effects. Prescription written.  Reassess in 3 months.

## 2013-03-24 NOTE — Patient Instructions (Addendum)
Phentermine; Topiramate extended-release capsules What is this medicine? Phentermine; topiramate (FEN ter meen; toe PYRE a mate) is a combination of two medicines used with a reduced calorie diet and exercise to help you lose weight. This medicine is only available through certified pharmacies enrolled in a special program. Your healthcare professional will tell you where you can get your medicine. If you have additional questions, you can visit the manufacture's website at [email or web address] or contact them by phone at [phone]. This medicine may be used for other purposes; ask your health care provider or pharmacist if you have questions. COMMON BRAND NAME(S): Qsymia What should I tell my health care provider before I take this medicine? They need to know if you have any of these conditions: -agitation -diarrhea -depression or other mental illness -diabetes -glaucoma -heart disease -high or low blood pressure -history of anorexia or other eating disorder -history of substance abuse -kidney stones or kidney disease -liver disease -lung disease like asthma, obstructive pulmonary disease, emphysema -metabolic acidosis -on a ketogenic diet -scheduled for surgery or a procedure -suicidal thoughts, plans, or attempt; a previous suicide attempt by you or a family member -taken an MAOI like Carbex, Eldepryl, Marplan, Nardil, or Parnate in last 14 days -thyroid disease -an unusual or allergic reaction to phentermine, topiramate, other medicines, foods, dyes, or preservatives -pregnant or trying to get pregnant -breast-feeding How should I use this medicine? Take this medicine by mouth with a glass of water. Follow the directions on the prescription label. Do not crush or chew. This medicine is usually taken with or without food once per day in the morning. Avoid taking this medicine in the evening. It may interfere with sleep. Take your doses at regular intervals. Do not take your  medicine more often than directed. A special MedGuide will be given to you by the pharmacist with each prescription and refill. Be sure to read this information carefully each time. Talk to your pediatrician regarding the use of this medicine in children. Special care may be needed. Overdosage: If you think you've taken too much of this medicine contact a poison control center or emergency room at once. Overdosage: If you think you have taken too much of this medicine contact a poison control center or emergency room at once. NOTE: This medicine is only for you. Do not share this medicine with others. What if I miss a dose? If you miss a dose, take it as soon as you can. If it is almost time for your next dose, take only that dose. Do not take double or extra doses. What may interact with this medicine? Do not take this medicine with any of the following medications: -MAOIs like Carbex, Eldepryl, Marplan, Nardil, and ParnateThis medicine may also interact with the following medications: -acetazolamide -amitriptyline -antihistamines for allergy, cough and cold -atropine -birth control pills -carbamazepine -certain medicines for bladder problems like oxybutynin, tolterodine -certain medicines for depression, anxiety, or psychotic disturbances -certain medicines for Parkinson's disease like benztropine, trihexyphenidyl -certain medicines for stomach problems like dicyclomine, hyoscyamine -certain medicines for travel sickness like scopolamine -dichlorphenamide -digoxin -diltiazem -diuretics -hydrochlorothiazide -ipratropium -lithium -medicines for diabetes -medicines for pain, sleep, or muscle relaxation -methazolamide -phenytoin -pioglitazone -stimulant medicines for attention disorders, weight loss, or to stay awake -valproic acid -zonisamide This list may not describe all possible interactions. Give your health care provider a list of all the medicines, herbs, non-prescription  drugs, or dietary supplements you use. Also tell them if you smoke, drink alcohol, or use illegal  drugs. Some items may interact with your medicine. What should I watch for while using this medicine? Visit your doctor or health care professional for regular checks on your progress. This medicine is intended to be used in addition to a healthy diet and appropriate exercise. The best results are achieved this way. Do not increase or in any way change your dose without consulting your doctor or health care professional. Do not take this medicine within 6 hours of bedtime. It can keep you from getting to sleep. Avoid drinks that contain caffeine and try to stick to a regular bedtime every night. Do not stop taking this medicine suddenly. This increases the risk of seizures. This medicine can decrease sweating and increase your body temperature. Watch for signs of deceased sweating or fever. Avoid extreme heat, hot baths, and saunas. Be careful about exercising, especially in hot weather. Contact your health care provider right away if you notice a fever or decrease in sweating. You should drink plenty of fluids while taking this medicine. If you have had kidney stones in the past, this will help to reduce your chances of forming kidney stones. If you have stomach pain, with nausea or vomiting and yellowing of your eyes or skin, call your doctor immediately. You may get drowsy or dizzy. Do not drive, use machinery, or do anything that needs mental alertness until you know how this medicine affects you. Do not stand or sit up quickly, especially if you are an older patient. This reduces the risk of dizzy or fainting spells. Alcohol may increase dizziness and drowsiness. Avoid alcoholic drinks. This medicine may affect blood sugar levels. If you have diabetes, check with your doctor or health care professional before you change your diet or the dose of your diabetic medicine. Patients and their families should  watch out for worsening depression or thoughts of suicide. Also watch out for sudden changes in feelings such as feeling anxious, agitated, panicky, irritable, hostile, aggressive, impulsive, severely restless, overly excited and hyperactive, or not being able to sleep. If this happens, especially at the beginning of treatment or after a change in dose, call your health care professional. If you notice blurred vision, eye pain, or other eye problems, seek medical attention at once for an eye exam. This medicine may increase the chance of developing metabolic acidosis. If left untreated, this can cause kidney stones, bone disease, or slowed growth in children. Symptoms include breathing fast, fatigue, loss of appetite, irregular heartbeat, or loss of consciousness. Call your doctor immediately if you experience any of these side effects. Also, tell your doctor about any surgery you plan on having while taking this medicine since this may increase your risk for metabolic acidosis. Women who become pregnant while using this medicine should contact their physician immediately. You should also contact The Qsymia Pregnancy Surveillance Program which is a program that monitors pregnancies that occur during treatment. Contact the program by calling [phone]. What side effects may I notice from receiving this medicine? Side effects that you should report to your doctor or health care professional as soon as possible: -allergic reactions like skin rash, itching or hives, swelling of the face, lips, or tongue -blood in the urine -changes in vision -chest pain or chest tightness -confusion -depressed mood -difficulty breathing -dizziness -fast or irregular heartbeat -feeling anxious -irritable -loss of appetite -low blood pressure -pain in the lower back or side -pain, tingling, numbness in the hands or feet -pain when urinating -palpitations -redness, blistering, peeling or  loosening of the skin,  including inside the mouth -shortness of breath -suicidal thoughts or other mood changes -trouble passing urine or change in the amount of urine -trouble walking, dizziness, loss of balance or coordination -unusually weak or tired -vomiting  Side effects that usually do not require medical attention (Report these to your doctor or health care professional if they continue or are bothersome.): -change in sex drive or performance -changes in vision -constipation -diarrhea -dry mouth -headache -nausea -tremors -trouble sleeping -upset stomach This list may not describe all possible side effects. Call your doctor for medical advice about side effects. You may report side effects to FDA at 1-800-FDA-1088. Where should I keep my medicine? Keep out of the reach of children. This medicine can be abused. Keep your medicine in a safe place to protect it from theft. Do not share this medicine with anyone. Selling or giving away this medicine is dangerous and against the law. Store at room temperature between 15 and 25 degrees C (59 and 77 degrees F). Throw away any unused medicine after the expiration date. NOTE: This sheet is a summary. It may not cover all possible information. If you have questions about this medicine, talk to your doctor, pharmacist, or health care provider.  2014, Elsevier/Gold Standard. (2010-09-11 13:56:53) Insomnia Insomnia is frequent trouble falling and/or staying asleep. Insomnia can be a long term problem or a short term problem. Both are common. Insomnia can be a short term problem when the wakefulness is related to a certain stress or worry. Long term insomnia is often related to ongoing stress during waking hours and/or poor sleeping habits. Overtime, sleep deprivation itself can make the problem worse. Every little thing feels more severe because you are overtired and your ability to cope is decreased. CAUSES   Stress, anxiety, and depression.  Poor sleeping  habits.  Distractions such as TV in the bedroom.  Naps close to bedtime.  Engaging in emotionally charged conversations before bed.  Technical reading before sleep.  Alcohol and other sedatives. They may make the problem worse. They can hurt normal sleep patterns and normal dream activity.  Stimulants such as caffeine for several hours prior to bedtime.  Pain syndromes and shortness of breath can cause insomnia.  Exercise late at night.  Changing time zones may cause sleeping problems (jet lag). It is sometimes helpful to have someone observe your sleeping patterns. They should look for periods of not breathing during the night (sleep apnea). They should also look to see how long those periods last. If you live alone or observers are uncertain, you can also be observed at a sleep clinic where your sleep patterns will be professionally monitored. Sleep apnea requires a checkup and treatment. Give your caregivers your medical history. Give your caregivers observations your family has made about your sleep.  SYMPTOMS   Not feeling rested in the morning.  Anxiety and restlessness at bedtime.  Difficulty falling and staying asleep. TREATMENT   Your caregiver may prescribe treatment for an underlying medical disorders. Your caregiver can give advice or help if you are using alcohol or other drugs for self-medication. Treatment of underlying problems will usually eliminate insomnia problems.  Medications can be prescribed for short time use. They are generally not recommended for lengthy use.  Over-the-counter sleep medicines are not recommended for lengthy use. They can be habit forming.  You can promote easier sleeping by making lifestyle changes such as:  Using relaxation techniques that help with breathing and reduce muscle  tension.  Exercising earlier in the day.  Changing your diet and the time of your last meal. No night time snacks.  Establish a regular time to go to  bed.  Counseling can help with stressful problems and worry.  Soothing music and white noise may be helpful if there are background noises you cannot remove.  Stop tedious detailed work at least one hour before bedtime. HOME CARE INSTRUCTIONS   Keep a diary. Inform your caregiver about your progress. This includes any medication side effects. See your caregiver regularly. Take note of:  Times when you are asleep.  Times when you are awake during the night.  The quality of your sleep.  How you feel the next day. This information will help your caregiver care for you.  Get out of bed if you are still awake after 15 minutes. Read or do some quiet activity. Keep the lights down. Wait until you feel sleepy and go back to bed.  Keep regular sleeping and waking hours. Avoid naps.  Exercise regularly.  Avoid distractions at bedtime. Distractions include watching television or engaging in any intense or detailed activity like attempting to balance the household checkbook.  Develop a bedtime ritual. Keep a familiar routine of bathing, brushing your teeth, climbing into bed at the same time each night, listening to soothing music. Routines increase the success of falling to sleep faster.  Use relaxation techniques. This can be using breathing and muscle tension release routines. It can also include visualizing peaceful scenes. You can also help control troubling or intruding thoughts by keeping your mind occupied with boring or repetitive thoughts like the old concept of counting sheep. You can make it more creative like imagining planting one beautiful flower after another in your backyard garden.  During your day, work to eliminate stress. When this is not possible use some of the previous suggestions to help reduce the anxiety that accompanies stressful situations. MAKE SURE YOU:   Understand these instructions.  Will watch your condition.  Will get help right away if you are not doing  well or get worse. Document Released: 02/03/2000 Document Revised: 04/30/2011 Document Reviewed: 03/05/2007 [facility] Patient Information 2014 [place].

## 2013-04-13 ENCOUNTER — Encounter: Payer: Self-pay | Admitting: Family Medicine

## 2013-04-27 ENCOUNTER — Telehealth: Payer: Self-pay | Admitting: Family Medicine

## 2013-04-27 MED ORDER — PITAVASTATIN CALCIUM 4 MG PO TABS: 4.0000 mg | ORAL_TABLET | Freq: Every day | ORAL | Status: DC

## 2013-04-27 NOTE — Telephone Encounter (Signed)
Left message for patient on [facility] that at this moment we do not have any samples

## 2013-04-27 NOTE — Telephone Encounter (Signed)
RX sent to pharmacy  

## 2013-04-27 NOTE — Addendum Note (Signed)
Addended by: Noe Gens E on: 04/27/2013 04:00 PM   Modules accepted: Orders

## 2013-04-27 NOTE — Telephone Encounter (Signed)
Pt will need rx for this med. livalo Pharm: [facility]/ [address]  90 days

## 2013-04-27 NOTE — Telephone Encounter (Signed)
Pt needs  Samples of livalo

## 2013-05-01 ENCOUNTER — Telehealth: Payer: Self-pay | Admitting: Family Medicine

## 2013-05-01 NOTE — Telephone Encounter (Signed)
I received a fax denying Livalo.  Pt must try and fail at least one of the following:  Lipitor, Crestor, or Zocor.

## 2013-05-03 NOTE — Telephone Encounter (Signed)
Verify if she has tried any of the alternative drugs listed.  If no intolerances, I would try Crestor 10 mg once daily.

## 2013-05-04 NOTE — Telephone Encounter (Signed)
Left message for patient to return call.

## 2013-05-04 NOTE — Telephone Encounter (Signed)
Pt has failed with Lipitor and Crestor.

## 2013-05-05 NOTE — Telephone Encounter (Signed)
Send in this information and she should be able to get prior auth.

## 2013-05-06 NOTE — Telephone Encounter (Signed)
I've submitted the Provider Courtesy Review to review the previous tried and failed medications.  I was advised it can take 48-72 hours for a response.

## 2013-05-08 ENCOUNTER — Ambulatory Visit (INDEPENDENT_AMBULATORY_CARE_PROVIDER_SITE_OTHER): Payer: BC Managed Care – PPO | Admitting: Family Medicine

## 2013-05-08 ENCOUNTER — Encounter: Payer: Self-pay | Admitting: Family Medicine

## 2013-05-08 VITALS — BP 130/80 | HR 78 | Temp 97.6°F | Wt 183.0 lb

## 2013-05-08 DIAGNOSIS — M719 Bursopathy, unspecified: Secondary | ICD-10-CM

## 2013-05-08 DIAGNOSIS — M67919 Unspecified disorder of synovium and tendon, unspecified shoulder: Secondary | ICD-10-CM

## 2013-05-08 DIAGNOSIS — M7582 Other shoulder lesions, left shoulder: Secondary | ICD-10-CM

## 2013-05-08 MED ORDER — METHYLPREDNISOLONE ACETATE 40 MG/ML IJ SUSP
40.0000 mg | Freq: Once | INTRAMUSCULAR | Status: AC
  Administered 2013-05-08: 40 mg via INTRA_ARTICULAR

## 2013-05-08 NOTE — Telephone Encounter (Signed)
PA was approved 04/28/13-02-18-2038

## 2013-05-08 NOTE — Addendum Note (Signed)
Addended by: Noe Gens E on: 05/08/2013 11:20 AM   Modules accepted: Orders

## 2013-05-08 NOTE — Progress Notes (Signed)
Pre visit review using our clinic review tool, if applicable. No additional management support is needed unless otherwise documented below in the visit note. 

## 2013-05-08 NOTE — Progress Notes (Signed)
   Subjective:    Patient ID: Debbie Werner, female    DOB: 1955-04-28, 58 y.o.   MRN: [id number]  Shoulder Pain  Pertinent negatives include no fever or numbness.   Patient seen with several week history of left shoulder pain. No injury. She describes achy pain which is worse at night and also with abduction and internal rotation. Denies any neck pain. No upper extremity weakness. She has very limited range of motion secondary to pain. She's tried basic anti-inflammatories without improvement.  She denies any prior history of injection. She denies any upper extremity numbness. Pain is moderate at times.  Past Medical History  Diagnosis Date  . ALLERGIC RHINITIS 08/03/2008  . CHEST PAIN, ATYPICAL 10/12/2008  . CONSTIPATION, CHRONIC 12/08/2009  . DEPRESSION 12/08/2009  . GERD 08/03/2008  . HEMATOCHEZIA 05/02/2009  . HYPERLIPIDEMIA 08/03/2008  . HYPERTENSION 08/03/2008  . SINUSITIS, CHRONIC 02/24/2009  . Gestational diabetes   . Obesity   . Cancer     Skin - has had "spots" removed for years   Past Surgical History  Procedure Laterality Date  . Breast surgery      bx  . Cystectomy  1996    ovary  . Tubal ligation    . Breath tek h pylori  12/21/2011    Procedure: BREATH TEK H PYLORI;  Surgeon: Pedro Earls, MD;  Location: [facility];  Service: General;  Laterality: N/A;  Katy/Andrea    reports that she has never smoked. She does not have any smokeless tobacco history on file. She reports that she does not drink alcohol or use illicit drugs. family history includes Heart disease (age of onset: 30) in her father. There is no history of Hyperlipidemia, Hypertension, Stroke, or Diabetes. Allergies  Allergen Reactions  . Anesthetics, Amide Nausea Only  . Pravastatin Sodium     REACTION: effects brain memory  . Sulfadiazine     GI upset      Review of Systems  Constitutional: Negative for fever and chills.  Neurological: Negative for weakness and numbness.       Objective:    Physical Exam  Constitutional: She appears well-developed and well-nourished.  Cardiovascular: Normal rate.   Pulmonary/Chest: Effort normal and breath sounds normal. No respiratory distress. She has no wheezes. She has no rales.  Musculoskeletal:  Left shoulder reveals fair range of motion but she does have significant pain with abduction greater than 90 and internal rotation. No a.c. joint tenderness. No biceps tenderness.  Neurological:  Deep tender reflexes are symmetric. No focal strength deficits          Assessment & Plan:  Rotator cuff tendinitis left shoulder.  Discussed risks and benefits of corticosteroid injection and patient consented.  After prepping skin with betadine, injected 40 mg depomedrol and 2 cc of plain xylocaine with 23 gauge one and one half inch needle using posterior lateral approach and pt tolerated well. Instructed in range of motion exercises. Followup in 2 weeks if symptoms not improving

## 2013-05-08 NOTE — Patient Instructions (Addendum)
Rotator Cuff Tendinitis  Rotator cuff tendinitis is inflammation of the tough, cord-like bands that connect muscle to bone (tendons) in your rotator cuff. Your rotator cuff is the collection of all the muscles and tendons that connect your arm to your shoulder. Your rotator cuff holds the head of your upper arm bone (humerus) in the cup (fossa) of your shoulder blade (scapula). CAUSES Rotator cuff tendinitis is usually caused by overusing the joint involved.  SIGNS AND SYMPTOMS  Deep ache in the shoulder also felt on the outside upper arm over the shoulder muscle.  Point tenderness over the area that is injured.  Pain comes on gradually and becomes worse with lifting the arm to the side (abduction) or turning it inward (internal rotation).  May lead to a chronic tear: When a rotator cuff tendon becomes inflamed, it runs the risk of losing its blood supply, causing some tendon fibers to die. This increases the risk that the tendon can fray and partially or completely tear. DIAGNOSIS Rotator cuff tendinitis is diagnosed by taking a medical history, performing a physical exam, and reviewing results of imaging exams. The medical history is useful to help determine the type of rotator cuff injury. The physical exam will include looking at the injured shoulder, feeling the injured area, and watching you do range-of-motion exercises. X-ray exams are typically done to rule out other causes of shoulder pain, such as fractures. MRI is the imaging exam usually used for significant shoulder injuries. Sometimes a dye study called CT arthrogram is done, but it is not as widely used as MRI. In some institutions, special ultrasound tests may also be used to aid in the diagnosis. TREATMENT  Less Severe Cases  Use of a sling to rest the shoulder for a short period of time. Prolonged use of the sling can cause stiffness, weakness, and loss of motion of the shoulder joint.  Anti-inflammatory medicines, such as  ibuprofen or naproxen sodium, may be prescribed. More Severe Cases  Physical therapy.  Use of steroid injections into the shoulder joint.  Surgery. HOME CARE INSTRUCTIONS   Use a sling or splint until the pain decreases. Prolonged use of the sling can cause stiffness, weakness, and loss of motion of the shoulder joint.  Apply ice to the injured area:  Put ice in a plastic bag.  Place a towel between your skin and the bag.  Leave the ice on for 20 minutes, 2 3 times a day.  Try to avoid use other than gentle range of motion while your shoulder is painful. Use the shoulder and exercise only as directed by your health care provider. Stop exercises or range of motion if pain or discomfort increases, unless directed otherwise by your health care provider.  Only take over-the-counter or prescription medicines for pain, discomfort, or fever as directed by your health care provider.  If you were given a shoulder sling and straps (immobilizer), do not remove it except as directed, or until you see a health care provider for a follow-up exam. If you need to remove it, move your arm as little as possible or as directed.  You may want to sleep on several pillows at night to lessen swelling and pain. SEEK IMMEDIATE MEDICAL CARE IF:   Your shoulder pain increases or new pain develops in your arm, hand, or fingers and is not relieved with medicines.  You have new, unexplained symptoms, especially increased numbness in the hands or loss of strength.  You develop any worsening of the   problems that brought you in for care.  Your arm, hand, or fingers are numb or tingling.  Your arm, hand, or fingers are swollen, painful, or turn white or blue. MAKE SURE YOU:  Understand these instructions.  Will watch your condition.  Will get help right away if you are not doing well or get worse. Document Released: 04/28/2003 Document Revised: 11/26/2012 Document Reviewed: 09/17/2012 [facility] Patient  Information 2014 [place], [place].  Touch base in 2 weeks if symptoms are not significantly improving

## 2013-06-23 ENCOUNTER — Ambulatory Visit: Payer: BC Managed Care – PPO | Admitting: Family Medicine

## 2013-06-29 ENCOUNTER — Telehealth: Payer: Self-pay | Admitting: Family Medicine

## 2013-06-29 MED ORDER — SERTRALINE HCL 50 MG PO TABS: 50.0000 mg | ORAL_TABLET | Freq: Every day | ORAL | Status: DC

## 2013-06-29 MED ORDER — ACYCLOVIR 400 MG PO TABS: 400.0000 mg | ORAL_TABLET | Freq: Two times a day (BID) | ORAL | Status: DC

## 2013-06-29 NOTE — Telephone Encounter (Signed)
She has taken in past.  Is she wanting to go back on?  If so, may start back Zoloft 50 mg once daily.

## 2013-06-29 NOTE — Telephone Encounter (Signed)
Pt wants Sertaline. Not currently on pt medication list. Is patient suppose to be taking medication.

## 2013-06-29 NOTE — Telephone Encounter (Signed)
Pt request refill sertraline (ZOLOFT) 50 MG tablet  And acyclovir (ZOVIRAX) 400 MG tablet 30 day w/ refills [facility]

## 2013-06-29 NOTE — Telephone Encounter (Signed)
RX sent to pharmacy  

## 2013-07-14 ENCOUNTER — Encounter: Payer: Self-pay | Admitting: Family Medicine

## 2013-07-14 ENCOUNTER — Ambulatory Visit (INDEPENDENT_AMBULATORY_CARE_PROVIDER_SITE_OTHER): Payer: BC Managed Care – PPO | Admitting: Family Medicine

## 2013-07-14 VITALS — BP 130/80 | HR 76 | Temp 97.5°F | Wt 179.0 lb

## 2013-07-14 DIAGNOSIS — E669 Obesity, unspecified: Secondary | ICD-10-CM

## 2013-07-14 DIAGNOSIS — E785 Hyperlipidemia, unspecified: Secondary | ICD-10-CM

## 2013-07-14 LAB — HEPATIC FUNCTION PANEL
ALT: 30 U/L (ref 0–35)
AST: 24 U/L (ref 0–37)
Albumin: 3.6 g/dL (ref 3.5–5.2)
Alkaline Phosphatase: 70 U/L (ref 39–117)
BILIRUBIN DIRECT: 0.1 mg/dL (ref 0.0–0.3)
BILIRUBIN TOTAL: 1.1 mg/dL (ref 0.2–1.2)
Total Protein: 6.6 g/dL (ref 6.0–8.3)

## 2013-07-14 LAB — LIPID PANEL
Cholesterol: 250 mg/dL — ABNORMAL HIGH (ref 0–200)
HDL: 35.2 mg/dL — ABNORMAL LOW (ref >39.00)
LDL CALC: 182 mg/dL — AB (ref 0–99)
Total CHOL/HDL Ratio: 7
Triglycerides: 163 mg/dL — ABNORMAL HIGH (ref 0.0–149.0)
VLDL: 32.6 mg/dL (ref 0.0–40.0)

## 2013-07-14 NOTE — Progress Notes (Signed)
Pre visit review using our clinic review tool, if applicable. No additional management support is needed unless otherwise documented below in the visit note. 

## 2013-07-14 NOTE — Progress Notes (Signed)
   Subjective:    Patient ID: Debbie Werner, female    DOB: 1955/07/12, 58 y.o.   MRN: [id number]  HPI Followup dyslipidemia. Patient has been intolerant of multiple statins in the past. We recently tried Livalo and she has been able to tolerate that. She's taking half of a 4 mg 3 times a week. No myalgias. She has no history of CAD or peripheral vascular but does have strong family history of dyslipidemia and cerebrovascular disease.  We recently tried Qsymia for weight loss but she had insomnia and quit taking this after about a month. She has previously taken phentermine but also had side effects.  Past Medical History  Diagnosis Date  . ALLERGIC RHINITIS 08/03/2008  . CHEST PAIN, ATYPICAL 10/12/2008  . CONSTIPATION, CHRONIC 12/08/2009  . DEPRESSION 12/08/2009  . GERD 08/03/2008  . HEMATOCHEZIA 05/02/2009  . HYPERLIPIDEMIA 08/03/2008  . HYPERTENSION 08/03/2008  . SINUSITIS, CHRONIC 02/24/2009  . Gestational diabetes   . Obesity   . Cancer     Skin - has had "spots" removed for years   Past Surgical History  Procedure Laterality Date  . Breast surgery      bx  . Cystectomy  1996    ovary  . Tubal ligation    . Breath tek h pylori  12/21/2011    Procedure: BREATH TEK H PYLORI;  Surgeon: Pedro Earls, MD;  Location: [facility];  Service: General;  Laterality: N/A;  Katy/Andrea    reports that she has never smoked. She does not have any smokeless tobacco history on file. She reports that she does not drink alcohol or use illicit drugs. family history includes Heart disease (age of onset: 64) in her father. There is no history of Hyperlipidemia, Hypertension, Stroke, or Diabetes. Allergies  Allergen Reactions  . Anesthetics, Amide Nausea Only  . Pravastatin Sodium     REACTION: effects brain memory  . Sulfadiazine     GI upset      Review of Systems  Constitutional: Negative for fatigue.  Eyes: Negative for visual disturbance.  Respiratory: Negative for cough, chest  tightness, shortness of breath and wheezing.   Cardiovascular: Negative for chest pain, palpitations and leg swelling.  Neurological: Negative for dizziness, seizures, syncope, weakness, light-headedness and headaches.       Objective:   Physical Exam  Constitutional: She appears well-developed and well-nourished. No distress.  Cardiovascular: Normal rate and regular rhythm.   Pulmonary/Chest: Effort normal and breath sounds normal. No respiratory distress. She has no wheezes. She has no rales.          Assessment & Plan:  Dyslipidemia. Repeat lipid and hepatic panel. Continue low-dose livalo. Continue with weight loss efforts.

## 2013-07-16 ENCOUNTER — Telehealth: Payer: Self-pay | Admitting: Family Medicine

## 2013-07-16 NOTE — Telephone Encounter (Signed)
Spoke with patient about results.

## 2013-07-16 NOTE — Telephone Encounter (Signed)
Pt looked at results on line and would like to discuss her lipids

## 2013-09-06 ENCOUNTER — Other Ambulatory Visit: Payer: Self-pay | Admitting: Family Medicine

## 2013-09-07 NOTE — Telephone Encounter (Signed)
Last visit 07/14/13 Last refill 02/24/13 #30 5 refill

## 2013-09-07 NOTE — Telephone Encounter (Signed)
Refill for 6 months. 

## 2013-12-10 ENCOUNTER — Encounter: Payer: Self-pay | Admitting: Podiatry

## 2013-12-10 ENCOUNTER — Ambulatory Visit (INDEPENDENT_AMBULATORY_CARE_PROVIDER_SITE_OTHER): Payer: BC Managed Care – PPO | Admitting: Podiatry

## 2013-12-10 ENCOUNTER — Ambulatory Visit (INDEPENDENT_AMBULATORY_CARE_PROVIDER_SITE_OTHER): Payer: BC Managed Care – PPO

## 2013-12-10 VITALS — BP 141/94 | HR 85 | Resp 13 | Ht 62.0 in | Wt 150.0 lb

## 2013-12-10 DIAGNOSIS — M79673 Pain in unspecified foot: Secondary | ICD-10-CM

## 2013-12-10 DIAGNOSIS — G5781 Other specified mononeuropathies of right lower limb: Secondary | ICD-10-CM

## 2013-12-10 DIAGNOSIS — G5761 Lesion of plantar nerve, right lower limb: Secondary | ICD-10-CM

## 2013-12-10 DIAGNOSIS — M2011 Hallux valgus (acquired), right foot: Secondary | ICD-10-CM

## 2013-12-10 DIAGNOSIS — M21611 Bunion of right foot: Secondary | ICD-10-CM

## 2013-12-10 NOTE — Progress Notes (Signed)
   Subjective:    Patient ID: Debbie Werner, female    DOB: 11/15/1955, 58 y.o.   MRN: [id number]  HPI Comments: Pt complains of sharp beneath right 2-4 toes, when standing.  Duration 3 - 4 years.     Review of Systems  All other systems reviewed and are negative.      Objective:   Physical Exam        Assessment & Plan:

## 2013-12-10 NOTE — Progress Notes (Signed)
Subjective:     Patient ID: Debbie Werner, female   DOB: May 16, 1955, 58 y.o.   MRN: [id number]  HPI patient states I need to get my right foot fixed like my left foot and I want to get it done towards the end of the year beginning of next year. States that it's been hurting her quite a bit more this year   Review of Systems     Objective:   Physical Exam Neurovascular status intact with hyperostosis medial aspect first metatarsal head right lateral aspect fifth metatarsal head right and shooting burning pain between the third and fourth toe right with a palpable mass noted. 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 Allow her to read consent form to go over concerning correction as she had the other foot done and is comfortable with going over everything today. I explained to her all complications and the disc is 1 foot did so well no guarantee on the second foot and patient understands this wants surgery and signs consent form after review. Patient is scheduled for outpatient surgery in the office and will have this done in the next several months and was encouraged to call with any questions for surgery

## 2014-01-05 ENCOUNTER — Telehealth: Payer: Self-pay | Admitting: *Deleted

## 2014-01-05 NOTE — Telephone Encounter (Signed)
I returned her call. "Dr. Paulla Dolly was planning on doing my surgery in office so I'm calling to check on the status of that."  I informed her that I am not sure if we have the equipment to perform it in the office yet.  Let me check with Dr. Paulla Dolly and see what he says and I will give you a call back.  "Okay, that is fine.  I was planning on doing it sometime in January so I wanted to follow-up."  (Patient's procedure will be an [employer]. Foot, a Metatarsal Osteotomy 5 Rt and a Neurectomy 3 Rt.)

## 2014-01-05 NOTE — Telephone Encounter (Signed)
I think we have the equipment coming. Should have next month

## 2014-01-06 NOTE — Telephone Encounter (Signed)
I called and left her a message that we do not have the equipment yet.  Dr. Paulla Dolly said we should have it next month.  We will call you when it comes in.

## 2014-01-20 ENCOUNTER — Ambulatory Visit: Payer: BC Managed Care – PPO | Admitting: Family Medicine

## 2014-01-21 ENCOUNTER — Encounter: Payer: Self-pay | Admitting: Family Medicine

## 2014-01-21 ENCOUNTER — Ambulatory Visit (INDEPENDENT_AMBULATORY_CARE_PROVIDER_SITE_OTHER): Payer: BC Managed Care – PPO | Admitting: Family Medicine

## 2014-01-21 VITALS — BP 140/88 | Temp 98.0°F | Ht 62.0 in | Wt 140.0 lb

## 2014-01-21 DIAGNOSIS — J0100 Acute maxillary sinusitis, unspecified: Secondary | ICD-10-CM

## 2014-01-21 MED ORDER — AZITHROMYCIN 250 MG PO TABS: ORAL_TABLET | ORAL | Status: DC

## 2014-01-21 NOTE — Progress Notes (Signed)
   Subjective:    Patient ID: Debbie Werner, female    DOB: [birth year], 58 y.o.   MRN: [id number]  HPI Here for 3 weeks of sinus pressure, ear pressure, ST, and a dry cough.    Review of Systems  Constitutional: Negative.   HENT: Positive for congestion, postnasal drip and sinus pressure.   Eyes: Negative.   Respiratory: Positive for cough.        Objective:   Physical Exam  Constitutional: She appears well-developed and well-nourished.  HENT:  Right Ear: External ear normal.  Left Ear: External ear normal.  Nose: Nose normal.  Mouth/Throat: Oropharynx is clear and moist.  Eyes: Conjunctivae are normal.  Pulmonary/Chest: Effort normal and breath sounds normal.  Lymphadenopathy:    She has no cervical adenopathy.          Assessment & Plan:  Add Mucinex

## 2014-01-21 NOTE — Progress Notes (Signed)
Pre visit review using our clinic review tool, if applicable. No additional management support is needed unless otherwise documented below in the visit note. 

## 2014-01-25 ENCOUNTER — Telehealth: Payer: Self-pay | Admitting: Family Medicine

## 2014-01-25 ENCOUNTER — Other Ambulatory Visit: Payer: Self-pay | Admitting: Family Medicine

## 2014-01-25 DIAGNOSIS — E669 Obesity, unspecified: Secondary | ICD-10-CM

## 2014-01-25 NOTE — Telephone Encounter (Signed)
Lab is ordered. Can you please schedule appt.

## 2014-01-25 NOTE — Telephone Encounter (Signed)
Is it okay to order lab?  

## 2014-01-25 NOTE — Telephone Encounter (Signed)
Pt saw dr fry on 12-3 and would like to have lab work to check tsh.

## 2014-01-25 NOTE — Telephone Encounter (Signed)
Yes.  May check TSH.

## 2014-01-25 NOTE — Telephone Encounter (Signed)
Pt also would like chole check

## 2014-01-26 NOTE — Telephone Encounter (Signed)
OK to add lipid panel

## 2014-01-27 ENCOUNTER — Other Ambulatory Visit: Payer: Self-pay | Admitting: Family Medicine

## 2014-01-27 DIAGNOSIS — E785 Hyperlipidemia, unspecified: Secondary | ICD-10-CM

## 2014-01-27 NOTE — Telephone Encounter (Signed)
Pt has been sch

## 2014-01-27 NOTE — Telephone Encounter (Signed)
Lab is ordered.  

## 2014-01-28 ENCOUNTER — Other Ambulatory Visit (INDEPENDENT_AMBULATORY_CARE_PROVIDER_SITE_OTHER): Payer: BC Managed Care – PPO

## 2014-01-28 ENCOUNTER — Encounter: Payer: Self-pay | Admitting: Family Medicine

## 2014-01-28 DIAGNOSIS — E785 Hyperlipidemia, unspecified: Secondary | ICD-10-CM

## 2014-01-28 DIAGNOSIS — E669 Obesity, unspecified: Secondary | ICD-10-CM

## 2014-01-28 LAB — TSH: TSH: 3.34 u[IU]/mL (ref 0.35–4.50)

## 2014-01-28 LAB — LIPID PANEL
CHOL/HDL RATIO: 5
Cholesterol: 186 mg/dL (ref 0–200)
HDL: 35.4 mg/dL — ABNORMAL LOW (ref >39.00)
LDL CALC: 133 mg/dL — AB (ref 0–99)
NONHDL: 150.6
Triglycerides: 88 mg/dL (ref 0.0–149.0)
VLDL: 17.6 mg/dL (ref 0.0–40.0)

## 2014-02-04 ENCOUNTER — Telehealth: Payer: Self-pay | Admitting: *Deleted

## 2014-02-04 NOTE — Telephone Encounter (Signed)
"  I'm calling to see if the equipment is there.  I'm supposed to have Bunion surgery.  I wanted to have it done in January.  Do I need to just schedule another appointment to come in and see him?"  Let's hold off on scheduling an appointment for now. I will have to speak with Dr. Paulla Dolly and give you a call back.

## 2014-02-09 NOTE — Telephone Encounter (Signed)
Talk to [facility]. Has been ordered and should be here in next couple weeks

## 2014-02-10 MED ORDER — DIAZEPAM 10 MG PO TABS: ORAL_TABLET | ORAL | Status: DC

## 2014-02-10 NOTE — Telephone Encounter (Signed)
I called and informed the patient that we do have the equipment.  I asked her if she could do it on 03/02/2014 at 12:30pm, arrive at the office at 12/ 12:15pm.  "That will be fine.  I can eat correct?"  Yes, you can eat.  "Can he prescribe me a Valium like he did the time before?"  I will have to ask Dr. Paulla Dolly.  You will need someone to come with you for the surgery.  "Okay, thank you for your help."

## 2014-02-10 NOTE — Telephone Encounter (Signed)
I called and left the patient a message to come by to pick up the prescription.  It cannot be called into the pharmacy.  Also we will need you to be here at 11:30am instead of 12N.  Call if you have any questions  Dr. Josephina Shih for Valium 10 mg, quantity of 2.  Take one prior to appointment and bring the other to surgery.  Someone will need to drive.

## 2014-02-16 ENCOUNTER — Ambulatory Visit (INDEPENDENT_AMBULATORY_CARE_PROVIDER_SITE_OTHER): Payer: BC Managed Care – PPO | Admitting: Family Medicine

## 2014-02-16 ENCOUNTER — Encounter: Payer: Self-pay | Admitting: Family Medicine

## 2014-02-16 VITALS — BP 130/74 | HR 86 | Temp 97.3°F | Wt 140.0 lb

## 2014-02-16 DIAGNOSIS — K5900 Constipation, unspecified: Secondary | ICD-10-CM

## 2014-02-16 DIAGNOSIS — K921 Melena: Secondary | ICD-10-CM

## 2014-02-16 LAB — CBC WITH DIFFERENTIAL/PLATELET
Basophils Absolute: 0 10*3/uL (ref 0.0–0.1)
Basophils Relative: 0.7 % (ref 0.0–3.0)
EOS ABS: 0.3 10*3/uL (ref 0.0–0.7)
EOS PCT: 4.6 % (ref 0.0–5.0)
HCT: 39.3 % (ref 36.0–46.0)
Hemoglobin: 12.8 g/dL (ref 12.0–15.0)
Lymphocytes Relative: 34.1 % (ref 12.0–46.0)
Lymphs Abs: 2.3 10*3/uL (ref 0.7–4.0)
MCHC: 32.6 g/dL (ref 30.0–36.0)
MCV: 89.8 fl (ref 78.0–100.0)
MONO ABS: 0.6 10*3/uL (ref 0.1–1.0)
Monocytes Relative: 8.7 % (ref 3.0–12.0)
NEUTROS PCT: 51.9 % (ref 43.0–77.0)
Neutro Abs: 3.5 10*3/uL (ref 1.4–7.7)
Platelets: 300 10*3/uL (ref 150.0–400.0)
RBC: 4.38 Mil/uL (ref 3.87–5.11)
RDW: 13.3 % (ref 11.5–15.5)
WBC: 6.8 10*3/uL (ref 4.0–10.5)

## 2014-02-16 MED ORDER — PROMETHAZINE HCL 25 MG RE SUPP: 25.0000 mg | Freq: Four times a day (QID) | RECTAL | Status: DC | PRN

## 2014-02-16 NOTE — Patient Instructions (Signed)
Bloody Stools °Bloody stools often mean that there is a problem in the digestive tract. Your caregiver may use the term "melena" to describe black, tarry, and bad smelling stools or "hematochezia" to describe red or maroon-colored stools. Blood seen in the stool can be caused by bleeding anywhere along the intestinal tract.  °A black stool usually means that blood is coming from the upper part of the gastrointestinal tract (esophagus, stomach, or small bowel). Passing maroon-colored stools or bright red blood usually means that blood is coming from lower down in the large bowel or the rectum. However, sometimes massive bleeding in the stomach or small intestine can cause bright red bloody stools.  °Consuming black licorice, lead, iron pills, medicines containing bismuth subsalicylate, or blueberries can also cause black stools. Your caregiver can test black stools to see if blood is present. °It is important that the cause of the bleeding be found. Treatment can then be started, and the problem can be corrected. Rectal bleeding may not be serious, but you should not assume everything is okay until you know the cause. It is very important to follow up with your caregiver or a specialist in gastrointestinal problems. °CAUSES  °Blood in the stools can come from various underlying causes. Often, the cause is not found during your first visit. Testing is often needed to discover the cause of bleeding in the gastrointestinal tract. Causes range from simple to serious or even life-threatening. Possible causes include: °· Hemorrhoids. These are veins that are full of blood (engorged) in the rectum. They cause pain, inflammation, and may bleed. °· Anal fissures. These are areas of painful tearing which may bleed. They are often caused by passing hard stool. °· Diverticulosis. These are pouches that form on the colon over time, with age, and may bleed significantly. °· Diverticulitis. This is inflammation in areas with  diverticulosis. It can cause pain, fever, and bloody stools, although bleeding is rare. °· Proctitis and colitis. These are inflamed areas of the rectum or colon. They may cause pain, fever, and bloody stools. °· Polyps and cancer. Colon cancer is a leading cause of preventable cancer death. It often starts out as precancerous polyps that can be removed during a colonoscopy, preventing progression into cancer. Sometimes, polyps and cancer may cause rectal bleeding. °· Gastritis and ulcers. Bleeding from the upper gastrointestinal tract (near the stomach) may travel through the intestines and produce black, sometimes tarry, often bad smelling stools. In certain cases, if the bleeding is fast enough, the stools may not be black, but red and the condition may be life-threatening. °SYMPTOMS  °You may have stools that are bright red and bloody, that are normal color with blood on them, or that are dark black and tarry. In some cases, you may only have blood in the toilet bowl. Any of these cases need medical care. You may also have: °· Pain at the anus or anywhere in the rectum. °· Lightheadedness or feeling faint. °· Extreme weakness. °· Nausea or vomiting. °· Fever. °DIAGNOSIS °Your caregiver may use the following methods to find the cause of your bleeding: °· Taking a medical history. Age is important. Older people tend to develop polyps and cancer more often. If there is anal pain and a hard, large stool associated with bleeding, a tear of the anus may be the cause. If blood drips into the toilet after a bowel movement, bleeding hemorrhoids may be the problem. The color and frequency of the bleeding are additional considerations. In most cases, the medical history provides clues, but seldom the final   answer. °· A visual and finger (digital) exam. Your caregiver will inspect the anal area, looking for tears and hemorrhoids. A finger exam can provide information when there is tenderness or a growth inside. In men, the  prostate is also examined. °· Endoscopy. Several types of small, long scopes (endoscopes) are used to view the colon. °· In the office, your caregiver may use a rigid, or more commonly, a flexible viewing sigmoidoscope. This exam is called flexible sigmoidoscopy. It is performed in 5 to 10 minutes. °· A more thorough exam is accomplished with a colonoscope. It allows your caregiver to view the entire 5 to 6 foot long colon. Medicine to help you relax (sedative) is usually given for this exam. Frequently, a bleeding lesion may be present beyond the reach of the sigmoidoscope. So, a colonoscopy may be the best exam to start with. Both exams are usually done on an outpatient basis. This means the patient does not stay overnight in the hospital or surgery center. °· An upper endoscopy may be needed to examine your stomach. Sedation is used and a flexible endoscope is put in your mouth, down to your stomach. °· A barium enema X-ray. This is an X-ray exam. It uses liquid barium inserted by enema into the rectum. This test alone may not identify an actual bleeding point. X-rays highlight abnormal shadows, such as those made by lumps (tumors), diverticuli, or colitis. °TREATMENT  °Treatment depends on the cause of your bleeding.  °· For bleeding from the stomach or colon, the caregiver doing your endoscopy or colonoscopy may be able to stop the bleeding as part of the procedure. °· Inflammation or infection of the colon can be treated with medicines. °· Many rectal problems can be treated with creams, suppositories, or warm baths. °· Surgery is sometimes needed. °· Blood transfusions are sometimes needed if you have lost a lot of blood. °· For any bleeding problem, let your caregiver know if you take aspirin or other blood thinners regularly. °HOME CARE INSTRUCTIONS  °· Take any medicines exactly as prescribed. °· Keep your stools soft by eating a diet high in fiber. Prunes (1 to 3 a day) work well for many people. °· Drink  enough water and fluids to keep your urine clear or pale yellow. °· Take sitz baths if advised. A sitz bath is when you sit in a bathtub with warm water for 10 to 15 minutes to soak, soothe, and cleanse the rectal area. °· If enemas or suppositories are advised, be sure you know how to use them. Tell your caregiver if you have problems with this. °· Monitor your bowel movements to look for signs of improvement or worsening. °SEEK MEDICAL CARE IF:  °· You do not improve in the time expected. °· Your condition worsens after initial improvement. °· You develop any new symptoms. °SEEK IMMEDIATE MEDICAL CARE IF:  °· You develop severe or prolonged rectal bleeding. °· You vomit blood. °· You feel weak or faint. °· You have a fever. °MAKE SURE YOU: °· Understand these instructions. °· Will watch your condition. °· Will get help right away if you are not doing well or get worse. °Document Released: 01/26/2002 Document Revised: 04/30/2011 Document Reviewed: 06/23/2010 °Exit[employer]® Patient Information ©2015 [employer]. This information is not intended to replace advice given to you by your health care provider. Make sure you discuss any questions you have with your health care provider. ° °Constipation °Constipation is when a person has fewer than three bowel movements   a week, has difficulty having a bowel movement, or has stools that are dry, hard, or larger than normal. As people grow older, constipation is more common. If you try to fix constipation with medicines that make you have a bowel movement (laxatives), the problem may get worse. Long-term laxative use may cause the muscles of the colon to become weak. A low-fiber diet, not taking in enough fluids, and taking certain medicines may make constipation worse.  °CAUSES  °· Certain medicines, such as antidepressants, pain medicine, iron supplements, antacids, and water pills.   °· Certain diseases, such as diabetes, irritable bowel syndrome (IBS), thyroid disease, or  depression.   °· Not drinking enough water.   °· Not eating enough fiber-rich foods.   °· Stress or travel.   °· Lack of physical activity or exercise.   °· Ignoring the urge to have a bowel movement.   °· Using laxatives too much.   °SIGNS AND SYMPTOMS  °· Having fewer than three bowel movements a week.   °· Straining to have a bowel movement.   °· Having stools that are hard, dry, or larger than normal.   °· Feeling full or bloated.   °· Pain in the lower abdomen.   °· Not feeling relief after having a bowel movement.   °DIAGNOSIS  °Your health care provider will take a medical history and perform a physical exam. Further testing may be done for severe constipation. Some tests may include: °· A barium enema X-ray to examine your rectum, colon, and, sometimes, your small intestine.   °· A sigmoidoscopy to examine your lower colon.   °· A colonoscopy to examine your entire colon. °TREATMENT  °Treatment will depend on the severity of your constipation and what is causing it. Some dietary treatments include drinking more fluids and eating more fiber-rich foods. Lifestyle treatments may include regular exercise. If these diet and lifestyle recommendations do not help, your health care provider may recommend taking over-the-counter laxative medicines to help you have bowel movements. Prescription medicines may be prescribed if over-the-counter medicines do not work.  °HOME CARE INSTRUCTIONS  °· Eat foods that have a lot of fiber, such as fruits, vegetables, whole grains, and beans. °· Limit foods high in fat and processed sugars, such as french fries, hamburgers, cookies, candies, and soda.   °· A fiber supplement may be added to your diet if you cannot get enough fiber from foods.   °· Drink enough fluids to keep your urine clear or pale yellow.   °· Exercise regularly or as directed by your health care provider.   °· Go to the restroom when you have the urge to go. Do not hold it.   °· Only take over-the-counter or  prescription medicines as directed by your health care provider. Do not take other medicines for constipation without talking to your health care provider first.   °SEEK IMMEDIATE MEDICAL CARE IF:  °· You have bright red blood in your stool.   °· Your constipation lasts for more than 4 days or gets worse.   °· You have abdominal or rectal pain.   °· You have thin, pencil-like stools.   °· You have unexplained weight loss. °MAKE SURE YOU:  °· Understand these instructions. °· Will watch your condition. °· Will get help right away if you are not doing well or get worse. °Document Released: 11/04/2003 Document Revised: 02/10/2013 Document Reviewed: 11/17/2012 °ExitCare® Patient Information ©2015 [employer]. This information is not intended to replace advice given to you by your health care provider. Make sure you discuss any questions you have with your health care provider. ° °

## 2014-02-16 NOTE — Progress Notes (Signed)
Subjective:    Patient ID: Debbie Werner, female    DOB: 05/16/1955, 58 y.o.   MRN: [id number]  HPI Patient seen with bright red blood per rectum. She first notices few weeks ago most recently around 22nd of December. She has a long history of constipation. Recent TSH normal. She's not had any pain with stools. She sometimes goes 6 or 7 days between bowel movements which is not unusual for her. She takes laxatives occasionally. She also thinks she may of had a separate episode of spotting from her vagina recently and she has set up follow-up with gynecologist evaluate that. She is distinctly those seen difference with blood in bowel movement. She's not had any recent bleeding or bruising, patient otherwise. No dizziness. She has declined colonoscopies in the past. She has lost substantial weight this year but due to her efforts. She is currently going to bariatric clinic and lost about 40 pounds. She is taking some type of appetite suppressant they have prescribed.  Past Medical History  Diagnosis Date  . ALLERGIC RHINITIS 08/03/2008  . CHEST PAIN, ATYPICAL 10/12/2008  . CONSTIPATION, CHRONIC 12/08/2009  . DEPRESSION 12/08/2009  . GERD 08/03/2008  . HEMATOCHEZIA 05/02/2009  . HYPERLIPIDEMIA 08/03/2008  . HYPERTENSION 08/03/2008  . SINUSITIS, CHRONIC 02/24/2009  . Gestational diabetes   . Obesity   . Cancer     Skin - has had "spots" removed for years   Past Surgical History  Procedure Laterality Date  . Breast surgery      bx  . Cystectomy  1996    [place]  . Tubal ligation    . Breath tek h pylori  12/21/2011    Procedure: BREATH TEK H PYLORI;  Surgeon: Pedro Earls, MD;  Location: [facility];  Service: General;  Laterality: N/A;  Katy/Andrea    reports that she has never smoked. She has never used smokeless tobacco. She reports that she does not drink alcohol or use illicit drugs. family history includes Heart disease (age of onset: 58) in her father. There is no history of  Hyperlipidemia, Hypertension, Stroke, or Diabetes. Allergies  Allergen Reactions  . Anesthetics, Amide Nausea Only  . Pravastatin Sodium     REACTION: effects brain memory  . Sulfadiazine     GI upset      Review of Systems  Constitutional: Negative for fever and chills.  Cardiovascular: Negative for chest pain.  Gastrointestinal: Positive for constipation and blood in stool. Negative for nausea, vomiting, abdominal pain and diarrhea.  Neurological: Negative for dizziness.  Hematological: Does not bruise/bleed easily.       Objective:   Physical Exam  Constitutional: She appears well-developed and well-nourished.  Cardiovascular: Normal rate and regular rhythm.   Pulmonary/Chest: Effort normal and breath sounds normal. No respiratory distress. She has no wheezes. She has no rales.  Genitourinary:  No evidence for external hemorrhoids. No visible anal fissures. Digital exam reveals somewhat tight anal sphincter. No rectal mass. Stool was brown and heme-negative          Assessment & Plan:  Hematochezia. Internal hemorrhoids are still in differential. She does not have evidence for anal fissure or external hemorrhoids. Given her age we have highly recommended GI referral for colonoscopy. She has been reluctant in the past but will now consider this. Also check CBC. Measures to reduce constipation discussed. We've also highly advised her to follow through with GYN appointment given the fact she reported some possible vaginal spotting and is about 9  years post menopause

## 2014-02-16 NOTE — Progress Notes (Signed)
Pre visit review using our clinic review tool, if applicable. No additional management support is needed unless otherwise documented below in the visit note. 

## 2014-02-17 ENCOUNTER — Encounter: Payer: Self-pay | Admitting: Family Medicine

## 2014-03-02 ENCOUNTER — Ambulatory Visit (INDEPENDENT_AMBULATORY_CARE_PROVIDER_SITE_OTHER): Payer: BC Managed Care – HMO

## 2014-03-02 ENCOUNTER — Ambulatory Visit (INDEPENDENT_AMBULATORY_CARE_PROVIDER_SITE_OTHER): Payer: BLUE CROSS/BLUE SHIELD | Admitting: Podiatry

## 2014-03-02 ENCOUNTER — Encounter: Payer: Self-pay | Admitting: Podiatry

## 2014-03-02 VITALS — BP 117/73 | HR 73 | Resp 16

## 2014-03-02 DIAGNOSIS — M2011 Hallux valgus (acquired), right foot: Secondary | ICD-10-CM

## 2014-03-02 DIAGNOSIS — G5761 Lesion of plantar nerve, right lower limb: Secondary | ICD-10-CM

## 2014-03-02 DIAGNOSIS — G5781 Other specified mononeuropathies of right lower limb: Secondary | ICD-10-CM

## 2014-03-02 MED ORDER — MEPERIDINE HCL 50 MG PO TABS: 50.0000 mg | ORAL_TABLET | ORAL | Status: DC | PRN

## 2014-03-02 NOTE — Progress Notes (Signed)
Subjective:     Patient ID: Debbie Werner, female   DOB: [birth year], 59 y.o.   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 Sterile prep was applied to the foot and the area was then draped in a sterile fashion. Patient had sterile prep applied at this time and we applied tourniquet at 250 mmHg which was inflated and the surgery was started. Incision was made medial aspect first metatarsal of approximate 6 cm length medial to the extensor hallucis longus tendon. Incision was taken through subcutaneous tissue with hemostasis being taken care of as needed and was then taken to capsule where a inverted L-shaped capsular incision was performed the capsular tissue was sharply dissected off the underlying bone and the medial eminence was found to be large and was removed in toto with power saw. The first intermetatarsal space was then entered and the abductor tendon was transected and a V-shaped osteotomy was then made in the first metatarsal and the capital fragment was moved in a lateral direction. It was fixated with 45 K wire fixation and was found to be adequately fixed and the wound was flushed with copious amounts of sterile device in solution and the small piece of medial prominence was removed. I then went ahead and sutured the capsule with 3-0 Dexon the subcutaneous tissue with 4-0 Monocryl and the  skin with 5-0 Dexon in a subcuticular fashion. Procedure to Taylor's bunionectomy with osteotomy right attention was directed to lateral aspect right fifth metatarsal where a 4 cm linear incision was made lateral to the extensor tendon. This was deepened through subcutaneous tissues tissue down to capsule and a linear capsular incision was made on the fifth metatarsal and the capsular tissue was sharply dissected off the underlying bone. An osteotomy cut and the fifth metatarsal was made and the capital fragment was transposed in the medial direction and fixated with 14 mm 2.L self-tapping cortical screw fixation. The wound was flushed and the capsule was sutured with 4-0 Monocryl and the subcutaneous tissue was sutured with 4-0 Monocryl and skin with 5-0 Dexon area procedure 3 neurectomy third interspace right attention was directed to third interspace right where a 4 cm linear incision was made from the interspace proximal. The incision was deepened through subcutaneous tissue and there was found to be a large mass lying within the third intermetatarsal space with branches to the third and fourth toe area the branches were transected the nerve mass was brought back in a proximal direction and was transected with a small proximal stump buried in fat tissue. The wound was flushed with copious mustard amounts of solution and the subcutaneous tissue was sutured with 4-0 Monocryl and the skin with 5-0 Dexon. All incision sites were then infiltrated with 2 mL of dexamethasone and sterile dressing was applied to the right foot and tourniquet was released and  capillary refill was noted to be admitted all digits on the right foot. Demerol was prescribed for patient and elevation was prescribed along with immobilization. Reappoint to be rechecked in one week

## 2014-03-02 NOTE — Patient Instructions (Addendum)
After Surgery Instructions  Wear wedge shoe for the first 3 weeks then you can switch over to the air fracture walker(boot).   1) If you are recuperating from surgery anywhere other than home, please be sure to leave [place] the number where you can be reached.  2) Go directly home and rest.  3) Keep the operated foot(feet) elevated six inches above the hip when sitting or lying down. This will help control swelling and pain.  4) Support the elevated foot and leg with pillows. DO NOT PLACE PILLOWS UNDER THE KNEE.  5) DO NOT REMOVE or get your bandages WET, unless you were given different instructions by your doctor to do so. This increases the risk of infection.  6) Wear your surgical shoe or surgical boot at all times when you are up on your feet.  7) A limited amount of pain and swelling may occur. The skin may take on a bruised appearance. DO NOT BE ALARMED, THIS IS NORMAL.  8) For slight pain and swelling, apply an ice pack directly over the bandages for 15 minutes only out of each hour of the day. Continue until seen in the office for your first post op visit. DON NOT     APPLY ANY FORM OF HEAT TO THE AREA.  9) Have prescriptions filled immediately and take as directed.  10) Drink lots of liquids, water and juice to stay hydrated.  11) CALL IMMEDIATELY IF:  *Bleeding continues until the following day of surgery  *Pain increases and/or does not respond to medication  *Bandages or cast appears to tight  *If your bandage gets wet  *Trip, fall or stump your surgical foot  *If your temperature goes above 101  *If you have ANY questions at all  [place]. ADHERING TO THESE INSTRUCTIONS WILL OFFER YOU THE MOST COMPLETE RESULTS

## 2014-03-09 ENCOUNTER — Ambulatory Visit (INDEPENDENT_AMBULATORY_CARE_PROVIDER_SITE_OTHER): Payer: BLUE CROSS/BLUE SHIELD | Admitting: Podiatrist

## 2014-03-09 ENCOUNTER — Encounter: Payer: Self-pay | Admitting: Podiatrist

## 2014-03-09 ENCOUNTER — Ambulatory Visit (INDEPENDENT_AMBULATORY_CARE_PROVIDER_SITE_OTHER): Payer: BC Managed Care – HMO

## 2014-03-09 VITALS — BP 117/73 | HR 73 | Resp 16

## 2014-03-09 DIAGNOSIS — M2011 Hallux valgus (acquired), right foot: Secondary | ICD-10-CM

## 2014-03-09 DIAGNOSIS — Z9889 Other specified postprocedural states: Secondary | ICD-10-CM

## 2014-03-18 ENCOUNTER — Ambulatory Visit: Payer: BC Managed Care – PPO | Admitting: Gastroenterology

## 2014-03-18 NOTE — Progress Notes (Signed)
Chief Complaint  Patient presents with  . Routine Post Op    DOS 03-02-2014  POV [place] bunionectomy, metatarsal osteotomy 5th met, and neurectomy 3rd interspace right foot    "Its doing good"     Subjective: Patient presents today1 week status post foot surgery of the right foot.  Date of surgery 03-02-2013. Patient denies nausea, vomiting, fevers, chills or night sweats.  Denies calf pain or tenderness to the operative side. Overall is doing very well.   Objective:  Neurovascular status is intact with palpable pedal pulses DP and PT bilateral at 2+ out of 4. Neurological sensation is intact and unchanged as per prior to surgery. Excellent appearance of the postoperative foot is noted. No redness, drainage or signs of infection noted.  Sutures are intact and suture line is well approximated.  Minimal post operative swelling is noted to be normal.  xrays show well healing surgical foot.  Good alignment and position of the first metatarsal with pin fixation and fifth metatarsal with screw fixation is noted.    Assessment: Status post right foot surgery  Plan:  The foot was redressed in a new dry, sterile and compressive dressing.  She will continue to wear the boot until instructed otherwise by Dr. Paulla Dolly. She will be seen back in 1 week for her second post operative visit.  If any questions or concerns arise in the  Meantime she will call.

## 2014-03-22 ENCOUNTER — Other Ambulatory Visit: Payer: Self-pay | Admitting: Family Medicine

## 2014-03-22 ENCOUNTER — Other Ambulatory Visit: Payer: BC Managed Care – HMO

## 2014-03-22 NOTE — Telephone Encounter (Signed)
Last visit 02/16/14 Last refill 09/07/13 #30 5 refill

## 2014-03-22 NOTE — Telephone Encounter (Signed)
Refill for 6 months. 

## 2014-03-23 ENCOUNTER — Ambulatory Visit: Payer: Self-pay

## 2014-03-23 ENCOUNTER — Ambulatory Visit (INDEPENDENT_AMBULATORY_CARE_PROVIDER_SITE_OTHER): Payer: BLUE CROSS/BLUE SHIELD | Admitting: Podiatry

## 2014-03-23 DIAGNOSIS — Z9889 Other specified postprocedural states: Secondary | ICD-10-CM

## 2014-03-24 NOTE — Progress Notes (Signed)
Subjective:     Patient ID: Debbie Werner, female   DOB: April 19, 1955, 59 y.o.   MRN: [id number]  HPI patient presents stating my right foot is doing well with mild edema noted but I'm able to walk comfortably with minimal problems   Review of Systems     Objective:   Physical Exam Neurovascular status intact muscle strength adequate range of motion within normal limits. Surgical sites on the right foot are healing well with wound edges well coapted hallux in rectus position and no indications of excessive edema    Assessment:     Doing well postoperative foot surgery right    Plan:     X-rays reviewed and instructed on continued elevation compression and immobilization. Reappoint 4 weeks and earlier if any issues should occur

## 2014-04-13 ENCOUNTER — Ambulatory Visit (INDEPENDENT_AMBULATORY_CARE_PROVIDER_SITE_OTHER): Payer: BLUE CROSS/BLUE SHIELD | Admitting: Podiatry

## 2014-04-13 ENCOUNTER — Encounter: Payer: Self-pay | Admitting: Podiatry

## 2014-04-13 ENCOUNTER — Ambulatory Visit (INDEPENDENT_AMBULATORY_CARE_PROVIDER_SITE_OTHER): Payer: BLUE CROSS/BLUE SHIELD

## 2014-04-13 VITALS — BP 130/81 | HR 77 | Resp 16

## 2014-04-13 DIAGNOSIS — M2011 Hallux valgus (acquired), right foot: Secondary | ICD-10-CM

## 2014-04-13 DIAGNOSIS — L03119 Cellulitis of unspecified part of limb: Secondary | ICD-10-CM | POA: Diagnosis not present

## 2014-04-13 DIAGNOSIS — L02619 Cutaneous abscess of unspecified foot: Secondary | ICD-10-CM

## 2014-04-13 MED ORDER — CEPHALEXIN 500 MG PO CAPS: 500.0000 mg | ORAL_CAPSULE | Freq: Two times a day (BID) | ORAL | Status: DC

## 2014-04-13 NOTE — Progress Notes (Signed)
Subjective:     Patient ID: Debbie Werner, female   DOB: 06/11/1955, 58 y.o.   MRN: [id number]  HPI patient states I have a lot of irritation on my right first metatarsal incision site and I just wanted to make  sure it was okay and I still gets some swelling   Review of Systems     Objective:   Physical Exam Neurovascular status was found to be intact with muscle strength adequate and range of motion within normal limits. Patient does have on the proximal portion of the first metatarsal incision site there is some crusted tissue with some redness surrounding the area with no odor or active drainage noted currently. No proximal edema erythema drainage noted and all incisions are well coapted    Assessment:     Possibility for a small proximal cellulitic event that's localized in nature with possible drainage    Plan:     Reviewed condition and did a proximal nerve block of the first metatarsal. Using sterile instrumentation I debrided tissue and did not note any drainage that I could culture and as precautionary measure since there's redness I did flush the area did not note any gapping of the incision and applied sterile dressing. I did go ahead and placed on antibiotics cephalexin 500 mg twice a day and also reviewed x-rays that healing is occurring but there will be some secondary healing around the fifth metatarsal

## 2014-04-15 ENCOUNTER — Other Ambulatory Visit: Payer: Self-pay | Admitting: Family Medicine

## 2014-04-15 ENCOUNTER — Other Ambulatory Visit: Payer: Self-pay

## 2014-04-16 LAB — CYTOLOGY - PAP

## 2014-04-19 ENCOUNTER — Other Ambulatory Visit: Payer: BC Managed Care – HMO

## 2014-05-06 ENCOUNTER — Encounter: Payer: Self-pay | Admitting: Family Medicine

## 2014-05-24 ENCOUNTER — Ambulatory Visit (INDEPENDENT_AMBULATORY_CARE_PROVIDER_SITE_OTHER): Payer: BLUE CROSS/BLUE SHIELD

## 2014-05-24 ENCOUNTER — Ambulatory Visit (INDEPENDENT_AMBULATORY_CARE_PROVIDER_SITE_OTHER): Payer: BLUE CROSS/BLUE SHIELD | Admitting: Podiatry

## 2014-05-24 DIAGNOSIS — Z9889 Other specified postprocedural states: Secondary | ICD-10-CM

## 2014-05-24 DIAGNOSIS — M21611 Bunion of right foot: Secondary | ICD-10-CM

## 2014-05-24 DIAGNOSIS — M2011 Hallux valgus (acquired), right foot: Secondary | ICD-10-CM

## 2014-05-25 NOTE — Progress Notes (Signed)
Subjective:     Patient ID: Debbie Werner, female   DOB: 1955/04/06, 59 y.o.   MRN: [id number]  HPI patient states my right foot continues to improve and only gets sore if I been on it all day. I been able to take walks and have played some golf with minimal discomfort   Review of Systems     Objective:   Physical Exam Neurovascular status intact with incision site right first and fifth metatarsal which are healing well and third interspace with wound edges well coapted and minimal edema noted and mild discomfort    Assessment:     Improving from forefoot reconstruction right    Plan:     Reviewed x-rays and advised this patient on continuing to be careful as they're still some healing around the fifth metatarsal to occur but everything looks good and it should go on to ultimate complete healing. She is not having symptoms associated with the fifth metatarsal and I do believe it will eventually heal completely. Reappoint to recheck in 2 months

## 2014-07-26 ENCOUNTER — Ambulatory Visit (INDEPENDENT_AMBULATORY_CARE_PROVIDER_SITE_OTHER): Payer: BLUE CROSS/BLUE SHIELD | Admitting: Podiatry

## 2014-07-26 ENCOUNTER — Ambulatory Visit (INDEPENDENT_AMBULATORY_CARE_PROVIDER_SITE_OTHER): Payer: BLUE CROSS/BLUE SHIELD

## 2014-07-26 VITALS — BP 141/52 | HR 78 | Resp 15

## 2014-07-26 DIAGNOSIS — Z9889 Other specified postprocedural states: Secondary | ICD-10-CM

## 2014-07-27 NOTE — Progress Notes (Signed)
Subjective:     Patient ID: Debbie Werner, female   DOB: Jan 16, 1956, 59 y.o.   MRN: [id number]  HPI patient presents stating I'm doing well with mild swelling and if I do too much I'll still gets some discomfort   Review of Systems     Objective:   Physical Exam 5 months after foot surgery right with incision sites healing well with mild edema no erythema no drainage noted with wound edges well coapted and good alignment of first MPJ with range of motion excellent    Assessment:     Doing well post foot surgery    Plan:     X-rays taking indicating pins screws in place with good alignment noted and no indications of abnormal bone healing. Patient is allowed to return to all normal activities and be seen back as needed

## 2014-10-18 ENCOUNTER — Other Ambulatory Visit: Payer: Self-pay | Admitting: Family Medicine

## 2014-10-18 NOTE — Telephone Encounter (Signed)
Refill for 6 months. 

## 2014-10-18 NOTE — Telephone Encounter (Signed)
Last visit 02/16/14 Last refill 03/24/14 #30 5 refill

## 2014-10-21 ENCOUNTER — Other Ambulatory Visit: Payer: Self-pay | Admitting: Family Medicine

## 2015-03-08 ENCOUNTER — Other Ambulatory Visit: Payer: Self-pay | Admitting: Family Medicine

## 2015-04-25 ENCOUNTER — Other Ambulatory Visit: Payer: Self-pay | Admitting: Family Medicine

## 2015-05-12 ENCOUNTER — Ambulatory Visit (INDEPENDENT_AMBULATORY_CARE_PROVIDER_SITE_OTHER): Payer: BLUE CROSS/BLUE SHIELD | Admitting: Family Medicine

## 2015-05-12 VITALS — BP 120/90 | HR 81 | Temp 97.9°F | Ht 62.0 in | Wt 141.9 lb

## 2015-05-12 DIAGNOSIS — I10 Essential (primary) hypertension: Secondary | ICD-10-CM

## 2015-05-12 DIAGNOSIS — G4700 Insomnia, unspecified: Secondary | ICD-10-CM

## 2015-05-12 DIAGNOSIS — F5104 Psychophysiologic insomnia: Secondary | ICD-10-CM

## 2015-05-12 DIAGNOSIS — E785 Hyperlipidemia, unspecified: Secondary | ICD-10-CM

## 2015-05-12 DIAGNOSIS — Z780 Asymptomatic menopausal state: Secondary | ICD-10-CM

## 2015-05-12 LAB — LIPID PANEL
CHOL/HDL RATIO: 5
Cholesterol: 233 mg/dL — ABNORMAL HIGH (ref 0–200)
HDL: 46.7 mg/dL (ref >39.00)
LDL CALC: 164 mg/dL — AB (ref 0–99)
NonHDL: 186.07
TRIGLYCERIDES: 108 mg/dL (ref 0.0–149.0)
VLDL: 21.6 mg/dL (ref 0.0–40.0)

## 2015-05-12 LAB — HEPATIC FUNCTION PANEL
ALBUMIN: 4.1 g/dL (ref 3.5–5.2)
ALK PHOS: 72 U/L (ref 39–117)
ALT: 17 U/L (ref 0–35)
AST: 14 U/L (ref 0–37)
Bilirubin, Direct: 0.1 mg/dL (ref 0.0–0.3)
TOTAL PROTEIN: 6.8 g/dL (ref 6.0–8.3)
Total Bilirubin: 0.7 mg/dL (ref 0.2–1.2)

## 2015-05-12 LAB — BASIC METABOLIC PANEL
BUN: 14 mg/dL (ref 6–23)
CHLORIDE: 105 meq/L (ref 96–112)
CO2: 31 meq/L (ref 19–32)
Calcium: 9.6 mg/dL (ref 8.4–10.5)
Creatinine, Ser: 0.98 mg/dL (ref 0.40–1.20)
GFR: 61.33 mL/min (ref >60.00)
Glucose, Bld: 86 mg/dL (ref 70–99)
Potassium: 4.4 mEq/L (ref 3.5–5.1)
SODIUM: 141 meq/L (ref 135–145)

## 2015-05-12 MED ORDER — LISINOPRIL 20 MG PO TABS: 20.0000 mg | ORAL_TABLET | Freq: Every day | ORAL | Status: DC

## 2015-05-12 MED ORDER — ACYCLOVIR 400 MG PO TABS: 400.0000 mg | ORAL_TABLET | Freq: Two times a day (BID) | ORAL | Status: DC

## 2015-05-12 MED ORDER — ALPRAZOLAM 0.25 MG PO TABS: 0.2500 mg | ORAL_TABLET | Freq: Every evening | ORAL | Status: DC | PRN

## 2015-05-12 MED ORDER — ACYCLOVIR 400 MG PO TABS: ORAL_TABLET | ORAL | Status: DC

## 2015-05-12 NOTE — Progress Notes (Signed)
Pre visit review using our clinic review tool, if applicable. No additional management support is needed unless otherwise documented below in the visit note. 

## 2015-05-12 NOTE — Progress Notes (Signed)
   Subjective:    Patient ID: Debbie Werner, female    DOB: [birth year], 60 y.o.   MRN: [id number]  HPI Here for medical follow-up She has done a tremendous job with weight loss on her own over the past year. She has done this largely 3 reducing sugars and starches.  Hypertension on lisinopril. Blood pressure very well controlled by home readings. No dizziness or headaches.  Chronic insomnia. She takes been on low-dose alprazolam for many years. No history of misuse. Requesting refills.  History of recurrent cold sores. She takes acyclovir as needed and this is worked well for her. History of hyperlipidemia. Watches her saturated fat intake. Previously, triglycerides of been very elevated. No history of hyperglycemia.  Past Medical History  Diagnosis Date  . ALLERGIC RHINITIS 08/03/2008  . CHEST PAIN, ATYPICAL 10/12/2008  . CONSTIPATION, CHRONIC 12/08/2009  . DEPRESSION 12/08/2009  . GERD 08/03/2008  . HEMATOCHEZIA 05/02/2009  . HYPERLIPIDEMIA 08/03/2008  . HYPERTENSION 08/03/2008  . SINUSITIS, CHRONIC 02/24/2009  . Gestational diabetes   . Obesity   . Cancer     Skin - has had "spots" removed for years   Past Surgical History  Procedure Laterality Date  . Breast surgery      bx  . Cystectomy  1996    ovary  . Tubal ligation    . Breath tek h pylori  12/21/2011    Procedure: BREATH TEK H PYLORI;  Surgeon: Pedro Earls, MD;  Location: [facility];  Service: General;  Laterality: N/A;  Katy/Andrea  . Bunionectomy Left   . Metatarsal osteotomy Left   . Neurectomy foot Left     reports that she has never smoked. She has never used smokeless tobacco. She reports that she does not drink alcohol or use illicit drugs. family history includes Heart disease (age of onset: 72) in her father. There is no history of Hyperlipidemia, Hypertension, Stroke, or Diabetes. Allergies  Allergen Reactions  . Anesthetics, Amide Nausea Only  . Pravastatin Sodium     REACTION: effects brain  memory  . Sulfadiazine     GI upset      Review of Systems  Constitutional: Negative for appetite change, fatigue and unexpected weight change.  Eyes: Negative for visual disturbance.  Respiratory: Negative for cough, chest tightness, shortness of breath and wheezing.   Cardiovascular: Negative for chest pain, palpitations and leg swelling.  Endocrine: Negative for polydipsia and polyuria.  Neurological: Negative for dizziness, seizures, syncope, weakness, light-headedness and headaches.       Objective:   Physical Exam  Constitutional: She appears well-developed and well-nourished.  Eyes: Pupils are equal, round, and reactive to light.  Neck: Neck supple. No JVD present. No thyromegaly present.  Cardiovascular: Normal rate and regular rhythm.  Exam reveals no gallop.   Pulmonary/Chest: Effort normal and breath sounds normal. No respiratory distress. She has no wheezes. She has no rales.  Musculoskeletal: She exhibits no edema.  Neurological: She is alert.          Assessment & Plan:  #1 hypertension. Stable and at goal. Refill medication for one year  #2 history of dyslipidemia. Recheck fasting lipid and hepatic panel  #3 history of recurrent cold sores. Refill acyclovir for as needed use  #4 chronic insomnia. Sleep hygiene discussed. Refill alprazolam low dosage 0.25 mg 1 daily at bedtime

## 2015-05-13 LAB — VITAMIN D 25 HYDROXY (VIT D DEFICIENCY, FRACTURES): VITD: 26.32 ng/mL — AB (ref 30.00–100.00)

## 2015-05-15 ENCOUNTER — Encounter: Payer: Self-pay | Admitting: Family Medicine

## 2015-07-21 DIAGNOSIS — H04123 Dry eye syndrome of bilateral lacrimal glands: Secondary | ICD-10-CM | POA: Diagnosis not present

## 2015-07-21 DIAGNOSIS — H40033 Anatomical narrow angle, bilateral: Secondary | ICD-10-CM | POA: Diagnosis not present

## 2015-07-28 DIAGNOSIS — H04123 Dry eye syndrome of bilateral lacrimal glands: Secondary | ICD-10-CM | POA: Diagnosis not present

## 2015-10-02 DIAGNOSIS — J069 Acute upper respiratory infection, unspecified: Secondary | ICD-10-CM | POA: Diagnosis not present

## 2015-10-02 DIAGNOSIS — H6981 Other specified disorders of Eustachian tube, right ear: Secondary | ICD-10-CM | POA: Diagnosis not present

## 2015-11-29 ENCOUNTER — Other Ambulatory Visit: Payer: Self-pay | Admitting: Family Medicine

## 2015-11-30 NOTE — Telephone Encounter (Signed)
Last ov: 05/12/15 Last rx: 05/12/15 #90 1 RF Pending ov: none Please advise

## 2015-12-01 NOTE — Telephone Encounter (Signed)
May refill #90 with one refill. 

## 2016-04-10 ENCOUNTER — Encounter: Payer: Self-pay | Admitting: Family Medicine

## 2016-04-10 ENCOUNTER — Ambulatory Visit (INDEPENDENT_AMBULATORY_CARE_PROVIDER_SITE_OTHER): Payer: BLUE CROSS/BLUE SHIELD | Admitting: Family Medicine

## 2016-04-10 VITALS — BP 150/90 | HR 72 | Ht 62.0 in | Wt 146.8 lb

## 2016-04-10 DIAGNOSIS — M1811 Unilateral primary osteoarthritis of first carpometacarpal joint, right hand: Secondary | ICD-10-CM | POA: Diagnosis not present

## 2016-04-10 MED ORDER — DICLOFENAC SODIUM 3 % TD GEL: 1.0000 "application " | Freq: Four times a day (QID) | TRANSDERMAL | 5 refills | Status: AC | PRN

## 2016-04-10 NOTE — Progress Notes (Signed)
Pre visit review using our clinic review tool, if applicable. No additional management support is needed unless otherwise documented below in the visit note. 

## 2016-04-10 NOTE — Patient Instructions (Signed)
Consider OTC Glucosamine

## 2016-04-10 NOTE — Progress Notes (Signed)
Subjective:     Patient ID: Debbie Werner, female   DOB: 11/19/1955, 61 y.o.   MRN: [id number]  HPI Patient seen with right "wrist" pain over the past few months. Her pain is actually more involving the right thumb. She did some painting of some shudders and started having pain after that. Mostly along the right CMC joint and MCP joint. No specific injury. She's tried some Bufferin and Aleve with minimal relief. No visible swelling. No warmth. No erythema. She plans to play golf and hopes to get some improvement before golf season starts.  Past Medical History:  Diagnosis Date  . ALLERGIC RHINITIS 08/03/2008  . Cancer ([place])    Skin - has had "spots" removed for years  . CHEST PAIN, ATYPICAL 10/12/2008  . CONSTIPATION, CHRONIC 12/08/2009  . DEPRESSION 12/08/2009  . GERD 08/03/2008  . Gestational diabetes   . HEMATOCHEZIA 05/02/2009  . HYPERLIPIDEMIA 08/03/2008  . HYPERTENSION 08/03/2008  . Obesity   . SINUSITIS, CHRONIC 02/24/2009   Past Surgical History:  Procedure Laterality Date  . BREAST SURGERY     bx  . BREATH TEK H PYLORI  12/21/2011   Procedure: BREATH TEK H PYLORI;  Surgeon: Pedro Earls, MD;  Location: [facility];  Service: General;  Laterality: N/A;  Katy/Andrea  . BUNIONECTOMY Left   . CYSTECTOMY  1996   ovary  . METATARSAL OSTEOTOMY Left   . NEURECTOMY FOOT Left   . TUBAL LIGATION      reports that she has never smoked. She has never used smokeless tobacco. She reports that she does not drink alcohol or use drugs. family history includes Heart disease (age of onset: 39) in her father. Allergies  Allergen Reactions  . Anesthetics, Amide Nausea Only  . Pravastatin Sodium     REACTION: effects brain memory  . Sulfadiazine     GI upset     Review of Systems  Neurological: Negative for weakness and numbness.       Objective:   Physical Exam  Constitutional: She appears well-developed and well-nourished.  Cardiovascular: Normal rate and regular rhythm.    Musculoskeletal:  Right thumb reveals some tenderness over the [facility] and MCP joint. No warmth. No erythema. Good range of motion. She has no wrist joint tenderness. She has no tenderness over the abductor or extensor tendons of the thumb.       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

## 2016-04-19 ENCOUNTER — Telehealth: Payer: Self-pay | Admitting: Family Medicine

## 2016-04-19 NOTE — Telephone Encounter (Signed)
Pt state that the insurance has denied the diclofenac and wanted to know if the form was sent to the doctor for approval?  Pt would like to have a call.

## 2016-04-19 NOTE — Telephone Encounter (Signed)
PA pending. [id number] Patient aware PA is in process.

## 2016-04-23 NOTE — Telephone Encounter (Signed)
Re-submit rx for Diclofenac gel 1%- apply 2 grams qid prn to affected joints.  100 gm tube.

## 2016-04-23 NOTE — Telephone Encounter (Signed)
PA has been denied. [employer] is approved for the topical treatment of actinic keratoses, in this case the member is not being treated for actinic keratoses.

## 2016-04-24 MED ORDER — DICLOFENAC SODIUM 1 % TD GEL: 2.0000 g | Freq: Four times a day (QID) | TRANSDERMAL | 0 refills | Status: DC

## 2016-04-24 NOTE — Telephone Encounter (Signed)
New Rx sent.

## 2016-04-24 NOTE — Addendum Note (Signed)
Addended by: Westley Hummer B on: 04/24/2016 09:05 AM   Modules accepted: Orders

## 2016-04-25 ENCOUNTER — Telehealth: Payer: Self-pay

## 2016-04-25 NOTE — Telephone Encounter (Signed)
Received PA request from [facility] for Diclofenac. PA approved & form faxed back to pharmacy.

## 2016-04-26 DIAGNOSIS — Z1231 Encounter for screening mammogram for malignant neoplasm of breast: Secondary | ICD-10-CM | POA: Diagnosis not present

## 2016-04-26 LAB — HM MAMMOGRAPHY

## 2016-05-21 ENCOUNTER — Encounter: Payer: Self-pay | Admitting: Family Medicine

## 2016-06-14 DIAGNOSIS — M180 Bilateral primary osteoarthritis of first carpometacarpal joints: Secondary | ICD-10-CM | POA: Diagnosis not present

## 2016-06-14 DIAGNOSIS — M79644 Pain in right finger(s): Secondary | ICD-10-CM | POA: Diagnosis not present

## 2016-06-14 DIAGNOSIS — M79645 Pain in left finger(s): Secondary | ICD-10-CM | POA: Diagnosis not present

## 2016-07-03 ENCOUNTER — Other Ambulatory Visit: Payer: Self-pay | Admitting: Family Medicine

## 2016-08-02 DIAGNOSIS — Z6826 Body mass index (BMI) 26.0-26.9, adult: Secondary | ICD-10-CM | POA: Diagnosis not present

## 2016-08-02 DIAGNOSIS — Z01419 Encounter for gynecological examination (general) (routine) without abnormal findings: Secondary | ICD-10-CM | POA: Diagnosis not present

## 2016-08-08 DIAGNOSIS — H04123 Dry eye syndrome of bilateral lacrimal glands: Secondary | ICD-10-CM | POA: Diagnosis not present

## 2016-08-08 DIAGNOSIS — H40033 Anatomical narrow angle, bilateral: Secondary | ICD-10-CM | POA: Diagnosis not present

## 2016-08-15 ENCOUNTER — Other Ambulatory Visit: Payer: Self-pay | Admitting: Family Medicine

## 2016-08-15 NOTE — Telephone Encounter (Signed)
Last refill 12/02/15 and last office visit 04/10/16.  Okay to fill?

## 2016-08-15 NOTE — Telephone Encounter (Signed)
Refill once.  Will discuss trying to come off at follow up.

## 2016-08-16 NOTE — Telephone Encounter (Signed)
Rx done and I called the pt and informed her of the message below. 

## 2016-10-18 DIAGNOSIS — H04123 Dry eye syndrome of bilateral lacrimal glands: Secondary | ICD-10-CM | POA: Diagnosis not present

## 2016-11-08 ENCOUNTER — Encounter: Payer: Self-pay | Admitting: Family Medicine

## 2016-11-19 ENCOUNTER — Encounter: Payer: Self-pay | Admitting: Family Medicine

## 2016-11-19 ENCOUNTER — Ambulatory Visit (INDEPENDENT_AMBULATORY_CARE_PROVIDER_SITE_OTHER): Payer: BLUE CROSS/BLUE SHIELD | Admitting: Family Medicine

## 2016-11-19 VITALS — BP 120/84 | HR 84 | Temp 97.6°F | Wt 145.8 lb

## 2016-11-19 DIAGNOSIS — L237 Allergic contact dermatitis due to plants, except food: Secondary | ICD-10-CM

## 2016-11-19 DIAGNOSIS — T7840XA Allergy, unspecified, initial encounter: Secondary | ICD-10-CM

## 2016-11-19 MED ORDER — METHYLPREDNISOLONE ACETATE 80 MG/ML IJ SUSP
80.0000 mg | Freq: Once | INTRAMUSCULAR | Status: AC
  Administered 2016-11-19: 80 mg via INTRAMUSCULAR

## 2016-11-19 MED ORDER — DOXYCYCLINE HYCLATE 100 MG PO CAPS
100.0000 mg | ORAL_CAPSULE | Freq: Two times a day (BID) | ORAL | 0 refills | Status: DC
Start: 2016-11-19 — End: 2017-04-08

## 2016-11-19 MED ORDER — PREDNISONE 10 MG PO TABS: ORAL_TABLET | ORAL | 0 refills | Status: DC

## 2016-11-19 NOTE — Patient Instructions (Signed)

## 2016-11-19 NOTE — Progress Notes (Signed)
Subjective:     Patient ID: Debbie Werner, female   DOB: August 07, 1955, 61 y.o.   MRN: [id number]  HPI   Patient seen with pruritic blistery rash left forearm. Onset last Thursday. Noted after lots of yard work. She's not any fevers or chills. Rash seems to be worsening. Exacerbated by heat. No alleviating factors.  Past Medical History:  Diagnosis Date  . ALLERGIC RHINITIS 08/03/2008  . Cancer ([place])    Skin - has had "spots" removed for years  . CHEST PAIN, ATYPICAL 10/12/2008  . CONSTIPATION, CHRONIC 12/08/2009  . DEPRESSION 12/08/2009  . GERD 08/03/2008  . Gestational diabetes   . HEMATOCHEZIA 05/02/2009  . HYPERLIPIDEMIA 08/03/2008  . HYPERTENSION 08/03/2008  . Obesity   . SINUSITIS, CHRONIC 02/24/2009   Past Surgical History:  Procedure Laterality Date  . BREAST SURGERY     bx  . BREATH TEK H PYLORI  12/21/2011   Procedure: BREATH TEK H PYLORI;  Surgeon: Pedro Earls, MD;  Location: [facility];  Service: General;  Laterality: N/A;  Katy/Andrea  . BUNIONECTOMY Left   . CYSTECTOMY  1996   ovary  . METATARSAL OSTEOTOMY Left   . NEURECTOMY FOOT Left   . TUBAL LIGATION      reports that she has never smoked. She has never used smokeless tobacco. She reports that she does not drink alcohol or use drugs. family history includes Heart disease (age of onset: 40) in her father. Allergies  Allergen Reactions  . Anesthetics, Amide Nausea Only  . Pravastatin Sodium     REACTION: effects brain memory  . Sulfadiazine     GI upset     Review of Systems  Constitutional: Negative for chills and fever.  Skin: Positive for rash.       Objective:   Physical Exam  Constitutional: She appears well-developed and well-nourished.  Cardiovascular: Normal rate and regular rhythm.   Pulmonary/Chest: Effort normal and breath sounds normal. No respiratory distress. She has no wheezes. She has no rales.  Skin: Rash noted.  Patient has vesicular rash on her left forearm with a couple  patches with erythematous base and vesicular surface. Mild surrounding erythema       Assessment:     Contact dermatitis left forearm. Cannot rule out early cellulitis changes but this is mostly pruritic so doubt infectious    Plan:     -Discussed systemic steroids and patient prefers intramuscular versus oral. Depo-Medrol 80 mg IM given -Printed prescription for oral prednisone taper to start if she gets insufficient results with intramuscular steroids above -Keep clean with soap and water -Watch closely for signs of infection such as fever, pain, erythematous streaking and if so start doxycycline 100 mg twice daily for 10 days  Eulas Post MD [facility]

## 2016-11-21 ENCOUNTER — Ambulatory Visit (INDEPENDENT_AMBULATORY_CARE_PROVIDER_SITE_OTHER): Payer: BLUE CROSS/BLUE SHIELD | Admitting: Family Medicine

## 2016-11-21 ENCOUNTER — Encounter: Payer: Self-pay | Admitting: Family Medicine

## 2016-11-21 VITALS — BP 120/80 | HR 70 | Temp 98.2°F | Wt 144.6 lb

## 2016-11-21 DIAGNOSIS — L259 Unspecified contact dermatitis, unspecified cause: Secondary | ICD-10-CM

## 2016-11-21 NOTE — Progress Notes (Signed)
Subjective:     Patient ID: Debbie Werner, female   DOB: 1955/12/04, 61 y.o.   MRN: [id number]  HPI Patient seen for follow-up rash. She had severe contact dermatitis left forearm. She had some blistering near the middle. We could not rule out some early cellulitis changes. She did start doxycycline and received Depo-Medrol. We also gave her some oral prednisone which she did not start on yet. She's noted a few new areas involving now left arm and these are small patches. Nonpainful. Very pruritic. No fevers or chills.  Past Medical History:  Diagnosis Date  . ALLERGIC RHINITIS 08/03/2008  . Cancer ([place])    Skin - has had "spots" removed for years  . CHEST PAIN, ATYPICAL 10/12/2008  . CONSTIPATION, CHRONIC 12/08/2009  . DEPRESSION 12/08/2009  . GERD 08/03/2008  . Gestational diabetes   . HEMATOCHEZIA 05/02/2009  . HYPERLIPIDEMIA 08/03/2008  . HYPERTENSION 08/03/2008  . Obesity   . SINUSITIS, CHRONIC 02/24/2009   Past Surgical History:  Procedure Laterality Date  . BREAST SURGERY     bx  . BREATH TEK H PYLORI  12/21/2011   Procedure: BREATH TEK H PYLORI;  Surgeon: Pedro Earls, MD;  Location: [facility];  Service: General;  Laterality: N/A;  Katy/Andrea  . BUNIONECTOMY Left   . CYSTECTOMY  1996   ovary  . METATARSAL OSTEOTOMY Left   . NEURECTOMY FOOT Left   . TUBAL LIGATION      reports that she has never smoked. She has never used smokeless tobacco. She reports that she does not drink alcohol or use drugs. family history includes Heart disease (age of onset: 75) in her father. Allergies  Allergen Reactions  . Anesthetics, Amide Nausea Only  . Pravastatin Sodium     REACTION: effects brain memory  . Sulfadiazine     GI upset     Review of Systems  Constitutional: Negative for chills and fever.  Skin: Positive for rash.       Objective:   Physical Exam  Constitutional: She appears well-developed and well-nourished.  Cardiovascular: Normal rate and regular rhythm.    Skin:  Patient has rash mostly in a larger patch left forearm volar surface with slightly less erythema compared with couple days ago. She still has some vesiculation of the center. Nontender. She has couple of very small patches of similar rash involving the right arm. This does not follow dermatome distribution.       Assessment:     Contact dermatitis left arm and forearm. No evidence for progressive cellulitis    Plan:     -Go ahead and start oral prednisone taper -Follow-up for any worsening rash or other concerns  Eulas Post MD  Primary Care at [facility]

## 2016-11-21 NOTE — Patient Instructions (Signed)
Go ahead and start the oral prednisone. Keep clean with soap and water.

## 2017-01-08 DIAGNOSIS — J40 Bronchitis, not specified as acute or chronic: Secondary | ICD-10-CM | POA: Diagnosis not present

## 2017-01-08 DIAGNOSIS — R05 Cough: Secondary | ICD-10-CM | POA: Diagnosis not present

## 2017-01-08 DIAGNOSIS — Z6827 Body mass index (BMI) 27.0-27.9, adult: Secondary | ICD-10-CM | POA: Diagnosis not present

## 2017-01-11 DIAGNOSIS — J209 Acute bronchitis, unspecified: Secondary | ICD-10-CM | POA: Diagnosis not present

## 2017-01-11 DIAGNOSIS — R509 Fever, unspecified: Secondary | ICD-10-CM | POA: Diagnosis not present

## 2017-01-11 DIAGNOSIS — Z6827 Body mass index (BMI) 27.0-27.9, adult: Secondary | ICD-10-CM | POA: Diagnosis not present

## 2017-01-11 DIAGNOSIS — R05 Cough: Secondary | ICD-10-CM | POA: Diagnosis not present

## 2017-01-14 ENCOUNTER — Other Ambulatory Visit: Payer: Self-pay | Admitting: Family Medicine

## 2017-04-08 ENCOUNTER — Encounter: Payer: Self-pay | Admitting: Family Medicine

## 2017-04-08 ENCOUNTER — Ambulatory Visit: Payer: BLUE CROSS/BLUE SHIELD | Admitting: Family Medicine

## 2017-04-08 VITALS — BP 110/74 | HR 68 | Temp 97.8°F | Wt 150.8 lb

## 2017-04-08 DIAGNOSIS — I10 Essential (primary) hypertension: Secondary | ICD-10-CM

## 2017-04-08 DIAGNOSIS — E785 Hyperlipidemia, unspecified: Secondary | ICD-10-CM | POA: Diagnosis not present

## 2017-04-08 MED ORDER — LISINOPRIL 20 MG PO TABS: 20.0000 mg | ORAL_TABLET | Freq: Every day | ORAL | 3 refills | Status: DC

## 2017-04-08 NOTE — Progress Notes (Signed)
Subjective:     Patient ID: Debbie Werner, female   DOB: 1955-11-28, 62 y.o.   MRN: [id number]  HPI Patient seen for follow-up regarding hypertension. She takes lisinopril 20 mg daily. Refills. Does not monitor blood pressure regularly. No dizziness or headaches. Appetite and weight have been stable. She also has history of mild hyperlipidemia. Requesting repeat labs. She is not fasting today.  Past Medical History:  Diagnosis Date  . ALLERGIC RHINITIS 08/03/2008  . Cancer ([place])    Skin - has had "spots" removed for years  . CHEST PAIN, ATYPICAL 10/12/2008  . CONSTIPATION, CHRONIC 12/08/2009  . DEPRESSION 12/08/2009  . GERD 08/03/2008  . Gestational diabetes   . HEMATOCHEZIA 05/02/2009  . HYPERLIPIDEMIA 08/03/2008  . HYPERTENSION 08/03/2008  . Obesity   . SINUSITIS, CHRONIC 02/24/2009   Past Surgical History:  Procedure Laterality Date  . BREAST SURGERY     bx  . BREATH TEK H PYLORI  12/21/2011   Procedure: BREATH TEK H PYLORI;  Surgeon: Pedro Earls, MD;  Location: [facility];  Service: General;  Laterality: N/A;  Katy/Andrea  . BUNIONECTOMY Left   . CYSTECTOMY  1996   ovary  . METATARSAL OSTEOTOMY Left   . NEURECTOMY FOOT Left   . TUBAL LIGATION      reports that  has never smoked. she has never used smokeless tobacco. She reports that she does not drink alcohol or use drugs. family history includes Heart disease (age of onset: 35) in her father. Allergies  Allergen Reactions  . Anesthetics, Amide Nausea Only  . Pravastatin Sodium     REACTION: effects brain memory  . Sulfadiazine     GI upset     Review of Systems  Constitutional: Negative for fatigue and unexpected weight change.  Eyes: Negative for visual disturbance.  Respiratory: Negative for cough, chest tightness, shortness of breath and wheezing.   Cardiovascular: Negative for chest pain, palpitations and leg swelling.  Endocrine: Negative for polydipsia and polyuria.  Neurological: Negative for dizziness,  seizures, syncope, weakness, light-headedness and headaches.       Objective:   Physical Exam  Constitutional: She appears well-developed and well-nourished.  Eyes: Pupils are equal, round, and reactive to light.  Neck: Neck supple. No JVD present. No thyromegaly present.  Cardiovascular: Normal rate and regular rhythm. Exam reveals no gallop.  Pulmonary/Chest: Effort normal and breath sounds normal. No respiratory distress. She has no wheezes. She has no rales.  Musculoskeletal: She exhibits no edema.  Neurological: She is alert.       Assessment:     Hypertension. Stable and at goal  Hyperlipidemia. No recent lipids    Plan:     -Continue weight control efforts and regular exercise -Refill lisinopril for one year -Order for future labs with lipid panel and basic metabolic panel  Eulas Post MD [facility] at [facility]

## 2017-04-15 DIAGNOSIS — Z85828 Personal history of other malignant neoplasm of skin: Secondary | ICD-10-CM | POA: Diagnosis not present

## 2017-04-15 DIAGNOSIS — C44612 Basal cell carcinoma of skin of right upper limb, including shoulder: Secondary | ICD-10-CM | POA: Diagnosis not present

## 2017-04-15 DIAGNOSIS — C44519 Basal cell carcinoma of skin of other part of trunk: Secondary | ICD-10-CM | POA: Diagnosis not present

## 2017-04-15 DIAGNOSIS — D225 Melanocytic nevi of trunk: Secondary | ICD-10-CM | POA: Diagnosis not present

## 2017-04-15 DIAGNOSIS — R238 Other skin changes: Secondary | ICD-10-CM | POA: Diagnosis not present

## 2017-04-15 DIAGNOSIS — L814 Other melanin hyperpigmentation: Secondary | ICD-10-CM | POA: Diagnosis not present

## 2017-04-15 DIAGNOSIS — D485 Neoplasm of uncertain behavior of skin: Secondary | ICD-10-CM | POA: Diagnosis not present

## 2017-05-21 ENCOUNTER — Other Ambulatory Visit: Payer: BLUE CROSS/BLUE SHIELD

## 2017-06-03 ENCOUNTER — Other Ambulatory Visit: Payer: BLUE CROSS/BLUE SHIELD

## 2017-08-08 DIAGNOSIS — C44612 Basal cell carcinoma of skin of right upper limb, including shoulder: Secondary | ICD-10-CM | POA: Diagnosis not present

## 2017-08-08 DIAGNOSIS — L905 Scar conditions and fibrosis of skin: Secondary | ICD-10-CM | POA: Diagnosis not present

## 2017-08-08 DIAGNOSIS — D485 Neoplasm of uncertain behavior of skin: Secondary | ICD-10-CM | POA: Diagnosis not present

## 2017-08-08 DIAGNOSIS — C44519 Basal cell carcinoma of skin of other part of trunk: Secondary | ICD-10-CM | POA: Diagnosis not present

## 2018-04-03 DIAGNOSIS — Z1231 Encounter for screening mammogram for malignant neoplasm of breast: Secondary | ICD-10-CM | POA: Diagnosis not present

## 2018-04-03 LAB — HM MAMMOGRAPHY

## 2018-04-14 ENCOUNTER — Other Ambulatory Visit: Payer: Self-pay | Admitting: Family Medicine

## 2018-05-13 ENCOUNTER — Other Ambulatory Visit: Payer: Self-pay

## 2018-05-13 ENCOUNTER — Ambulatory Visit (INDEPENDENT_AMBULATORY_CARE_PROVIDER_SITE_OTHER): Payer: Self-pay | Admitting: Family Medicine

## 2018-05-13 DIAGNOSIS — I10 Essential (primary) hypertension: Secondary | ICD-10-CM

## 2018-05-13 NOTE — Progress Notes (Signed)
Patient ID: Debbie Werner, female   DOB: [birth year], 63 y.o.   MRN: [id number]  Virtual Visit via Telephone Note  I connected with Debbie Werner on 05/13/18 at 10:15 AM EDT by telephone and verified that I am speaking with the correct person using two identifiers.   I discussed the limitations, risks, security and privacy concerns of performing an evaluation and management service by telephone and the availability of in person appointments. I also discussed with the patient that there may be a patient responsible charge related to this service. The patient expressed understanding and agreed to proceed.  Location patient: home Location provider: work or home office Participants present for the call: patient, provider Patient did not have a visit in the prior 7 days to address this/these issue(s).   History of Present Illness: We were planning on doing a Webex visit but her video was not functioning properly.  She has hypertension treated with lisinopril.  Compliant with therapy.  She denies any side effects such as chronic cough.  Her blood pressures been well controlled.  Recent blood pressure 116/79.  No headaches.  No dizziness.  No chest pains.  Appetite and weight stable.  Walks some for exercise.  Needing refills of lisinopril.  She is overdue for labs and also overdue for physical and hopes to schedule these after coronavirus pandemic has settled down   Observations/Objective: Patient sounds cheerful and well on the phone. I do not appreciate any SOB. Speech and thought processing are grossly intact. Patient reported vitals:  Assessment and Plan: Hypertension well-controlled with lisinopril  -Refill lisinopril for 1 year -She is overdue for labs and will schedule and come in for physical once current pandemic has settled down  Follow Up Instructions:  -Schedule physical for later this year   99441 5-10 [id number] 11-20 9443 21-30 I did not refer this patient for an OV in the  next 24 hours for this/these issue(s).  I discussed the assessment and treatment plan with the patient. The patient was provided an opportunity to ask questions and all were answered. The patient agreed with the plan and demonstrated an understanding of the instructions.   The patient was advised to call back or seek an in-person evaluation if the symptoms worsen or if the condition fails to improve as anticipated.  I provided 11 minutes of non-face-to-face time during this encounter.   Carolann Littler, MD

## 2018-05-14 ENCOUNTER — Other Ambulatory Visit: Payer: Self-pay | Admitting: Family Medicine

## 2018-05-18 ENCOUNTER — Other Ambulatory Visit: Payer: Self-pay | Admitting: Family Medicine

## 2018-05-19 MED ORDER — LISINOPRIL 20 MG PO TABS: 20.0000 mg | ORAL_TABLET | Freq: Every day | ORAL | 0 refills | Status: DC

## 2018-05-21 ENCOUNTER — Telehealth: Payer: Self-pay | Admitting: Family Medicine

## 2018-06-08 NOTE — Telephone Encounter (Signed)
error 

## 2018-06-16 ENCOUNTER — Other Ambulatory Visit: Payer: Self-pay | Admitting: Family Medicine

## 2018-09-24 ENCOUNTER — Telehealth: Payer: Self-pay | Admitting: Family Medicine

## 2018-09-24 DIAGNOSIS — I10 Essential (primary) hypertension: Secondary | ICD-10-CM

## 2018-09-24 DIAGNOSIS — E785 Hyperlipidemia, unspecified: Secondary | ICD-10-CM

## 2018-09-24 NOTE — Telephone Encounter (Signed)
I put in future lab order

## 2018-09-24 NOTE — Telephone Encounter (Signed)
Copied from [place] [id number]. Topic: General - Other >> Sep 24, 2018  3:38 PM Debbie Werner wrote: Reason for CRM: Patient says Dr. Elease Hashimoto told her to have routine labs done including cholesterol check. Says her potassium was high at other providers office and they usggested a recheck. Please call to schedule once orders are placed.

## 2018-09-24 NOTE — Telephone Encounter (Signed)
Please see message. I do not see any current labs on patient. Please advise.

## 2018-09-26 NOTE — Telephone Encounter (Signed)
Called patient and she has a lab appointment on 10/02/18 at 9:40am.

## 2018-10-02 ENCOUNTER — Other Ambulatory Visit (INDEPENDENT_AMBULATORY_CARE_PROVIDER_SITE_OTHER): Payer: Self-pay

## 2018-10-02 ENCOUNTER — Other Ambulatory Visit: Payer: Self-pay

## 2018-10-02 DIAGNOSIS — I10 Essential (primary) hypertension: Secondary | ICD-10-CM

## 2018-10-02 DIAGNOSIS — E785 Hyperlipidemia, unspecified: Secondary | ICD-10-CM

## 2018-10-02 LAB — HEPATIC FUNCTION PANEL
ALT: 23 U/L (ref 0–35)
AST: 17 U/L (ref 0–37)
Albumin: 4.3 g/dL (ref 3.5–5.2)
Alkaline Phosphatase: 81 U/L (ref 39–117)
Bilirubin, Direct: 0.1 mg/dL (ref 0.0–0.3)
Total Bilirubin: 0.6 mg/dL (ref 0.2–1.2)
Total Protein: 6.5 g/dL (ref 6.0–8.3)

## 2018-10-02 LAB — BASIC METABOLIC PANEL
BUN: 16 mg/dL (ref 6–23)
CO2: 28 mEq/L (ref 19–32)
Calcium: 9.3 mg/dL (ref 8.4–10.5)
Chloride: 105 mEq/L (ref 96–112)
Creatinine, Ser: 0.95 mg/dL (ref 0.40–1.20)
GFR: 59.16 mL/min — ABNORMAL LOW (ref >60.00)
Glucose, Bld: 80 mg/dL (ref 70–99)
Potassium: 4.3 mEq/L (ref 3.5–5.1)
Sodium: 140 mEq/L (ref 135–145)

## 2018-10-02 LAB — LIPID PANEL
Cholesterol: 260 mg/dL — ABNORMAL HIGH (ref 0–200)
HDL: 39.6 mg/dL (ref >39.00)
LDL Cholesterol: 183 mg/dL — ABNORMAL HIGH (ref 0–99)
NonHDL: 220.22
Total CHOL/HDL Ratio: 7
Triglycerides: 186 mg/dL — ABNORMAL HIGH (ref 0.0–149.0)
VLDL: 37.2 mg/dL (ref 0.0–40.0)

## 2018-10-07 ENCOUNTER — Other Ambulatory Visit: Payer: Self-pay

## 2018-10-07 ENCOUNTER — Telehealth (INDEPENDENT_AMBULATORY_CARE_PROVIDER_SITE_OTHER): Payer: BC Managed Care – PPO | Admitting: Family Medicine

## 2018-10-07 VITALS — BP 116/80

## 2018-10-07 DIAGNOSIS — I10 Essential (primary) hypertension: Secondary | ICD-10-CM | POA: Diagnosis not present

## 2018-10-07 DIAGNOSIS — E785 Hyperlipidemia, unspecified: Secondary | ICD-10-CM

## 2018-10-07 MED ORDER — LIVALO 1 MG PO TABS: ORAL_TABLET | ORAL | 3 refills | Status: DC

## 2018-10-07 NOTE — Progress Notes (Signed)
This visit type was conducted due to national recommendations for restrictions regarding the COVID-19 pandemic in an effort to limit this patient's exposure and mitigate transmission in our community.   Virtual Visit via Video Note  I connected with@ on 10/07/18 at  9:15 AM EDT by a video enabled telemedicine application and verified that I am speaking with the correct person using two identifiers.  Location patient: home Location provider:work or home office Persons participating in the virtual visit: patient, provider  I discussed the limitations of evaluation and management by telemedicine and the availability of in person appointments. The patient expressed understanding and agreed to proceed.   HPI: Patient had recent labs done.  This included basic metabolic panel, hepatic panel, and lipid panel.  Her electrolytes and liver panel were stable.  She had cholesterol 260, triglycerides 186, HDL 39, and LDL 183.  Patient has been less compliant with exercise recently and also diet.  She has gained about 15 pounds.  5 years ago she lost 42 pounds and cholesterol went down to 186.  Previous reported intolerance with pravastatin.  She thinks she had some myalgias but recall some "brain fog ".  She thinks she may have had previous intolerance with Crestor as well.  She did take Livalo previously but this was higher dose of 4 mg.  Her ten-year risk of CAD event is 7.5%.  She has not tried Zetia previously.  Other comorbidities include hypertension.  No history of diabetes.  Non-smoker Her father had coronary artery disease  The 10-year ASCVD risk score Mikey Bussing DC Brooke Bonito., et al., 2013) is: 7.5%   Values used to calculate the score:     Age: 63 years     Sex: Female     Is Non-Hispanic African American: No     Diabetic: No     Tobacco smoker: No     Systolic Blood Pressure: 884 mmHg     Is BP treated: Yes     HDL Cholesterol: 39.6 mg/dL     Total Cholesterol: 260 mg/dL   ROS: See pertinent  positives and negatives per HPI.  Past Medical History:  Diagnosis Date  . ALLERGIC RHINITIS 08/03/2008  . Cancer ([place])    Skin - has had "spots" removed for years  . CHEST PAIN, ATYPICAL 10/12/2008  . CONSTIPATION, CHRONIC 12/08/2009  . DEPRESSION 12/08/2009  . GERD 08/03/2008  . Gestational diabetes   . HEMATOCHEZIA 05/02/2009  . HYPERLIPIDEMIA 08/03/2008  . HYPERTENSION 08/03/2008  . Obesity   . SINUSITIS, CHRONIC 02/24/2009    Past Surgical History:  Procedure Laterality Date  . BREAST SURGERY     bx  . BREATH TEK H PYLORI  12/21/2011   Procedure: BREATH TEK H PYLORI;  Surgeon: Pedro Earls, MD;  Location: [facility];  Service: General;  Laterality: N/A;  Katy/Andrea  . BUNIONECTOMY Left   . CYSTECTOMY  1996   ovary  . METATARSAL OSTEOTOMY Left   . NEURECTOMY FOOT Left   . TUBAL LIGATION      Family History  Problem Relation Age of Onset  . Heart disease Father 48       CABG  . Hyperlipidemia Neg Hx        parent, grandparent  . Hypertension Neg Hx        parent , grandparent  . Stroke Neg Hx        grandparent  . Diabetes Neg Hx        grandparent    SOCIAL  HX: Non-smoker.  Husband has had some health issues with stroke during the past year.  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  acyclovir (ZOVIRAX) 400 MG tablet, Take one tablet twice daily prn, Disp: 30 tablet, Rfl: 3 .  cetirizine (ZYRTEC) 10 MG tablet, Take 10 mg by mouth daily. , Disp: , Rfl:  .  Diclofenac Sodium 3 % GEL, Place 1 application onto the skin 4 (four) times daily as needed., Disp: 100 g, Rfl: 5 .  lisinopril (PRINIVIL,ZESTRIL) 20 MG tablet, TAKE 1 TABLET BY MOUTH ONCE DAILY NEEDS  APPOINTMENT  FOR  REFILLS, Disp: 90 tablet, Rfl: 3 .  lisinopril (ZESTRIL) 20 MG tablet, Take 1 tablet (20 mg total) by mouth daily., Disp: 90 tablet, Rfl: 3 .  Multiple Vitamin (MULTIVITAMIN) capsule, Take 1 capsule by mouth daily., Disp: , Rfl:    EXAM:  VITALS per patient if applicable:  GENERAL: alert, oriented, appears well and in no acute distress  HEENT: atraumatic, conjunttiva clear, no obvious abnormalities on inspection of external nose and ears  NECK: normal movements of the head and neck  LUNGS: on inspection no signs of respiratory distress, breathing rate appears normal, no obvious gross SOB, gasping or wheezing  CV: no obvious cyanosis  MS: moves all visible extremities without noticeable abnormality  PSYCH/NEURO: pleasant and cooperative, no obvious depression or anxiety, speech and thought processing grossly intact  ASSESSMENT AND PLAN:  Discussed the following assessment and plan:  #1 hyperlipidemia.  Ten-year risk of CAD event 7.5%.  Previous intolerance with pravastatin  -Recommend trial of low-dose Livalo 1 mg every Monday Wednesday and Friday and if tolerating well recheck lipids in 2 months -If intolerant of low-dose Livalo consider trial of Zetia  #2 hypertension stable -Continue lisinopril     I discussed the assessment and treatment plan with the patient. The patient was provided an opportunity to ask questions and all were answered. The patient agreed with the plan and demonstrated an understanding of the instructions.   The patient was advised to call back or seek an in-person evaluation if the symptoms worsen or if the condition fails to improve as anticipated.   Carolann Littler, MD

## 2018-10-10 ENCOUNTER — Encounter: Payer: Self-pay | Admitting: Family Medicine

## 2018-10-10 ENCOUNTER — Other Ambulatory Visit: Payer: Self-pay

## 2018-10-10 MED ORDER — LIVALO 1 MG PO TABS: ORAL_TABLET | ORAL | 3 refills | Status: DC

## 2018-10-14 ENCOUNTER — Encounter: Payer: Self-pay | Admitting: Family Medicine

## 2018-10-16 ENCOUNTER — Telehealth: Payer: Self-pay | Admitting: Family Medicine

## 2018-10-16 NOTE — Telephone Encounter (Signed)
Patient would like a different med from Pitavastatin Calcium (LIVALO) 1 MG TABS Due to insurance being too expensive for med.  Patient would like a call back to know what med and when it was sent, [facility] [address], [place] [phone] (Phone) [phone] (Fax)

## 2018-10-16 NOTE — Telephone Encounter (Signed)
Routing to Dr. Elease Hashimoto to advise

## 2018-10-17 ENCOUNTER — Telehealth: Payer: Self-pay

## 2018-10-17 NOTE — Telephone Encounter (Signed)
See separate note.  She has been intolerant of multiple statins in the past so not sure what she can tolerate.

## 2018-10-17 NOTE — Telephone Encounter (Signed)
Copied from [place] [id number]. Topic: Quick Communication - See Telephone Encounter >> Oct 16, 2018  6:11 PM Loma Boston wrote: CRM for notification. See Telephone encounter for: 10/16/18. Note to Dr B that pt's  Prior Approval for Darius Bump has been approved and a fax has been sent concerning medication per [facility]

## 2018-10-21 ENCOUNTER — Other Ambulatory Visit: Payer: Self-pay

## 2018-10-21 MED ORDER — ROSUVASTATIN CALCIUM 10 MG PO TABS: 10.0000 mg | ORAL_TABLET | ORAL | 0 refills | Status: DC

## 2018-10-21 NOTE — Telephone Encounter (Signed)
Patient called to inquire about the statin that Dr Elease Hashimoto was supposed to sen to the pharmacy for her. Please follow up with patient. Ph# [phone]

## 2018-10-21 NOTE — Telephone Encounter (Signed)
Called patient and let her know that I have sent in the Crestor 10 mg to [facility] in [place] for her and to please let us know if she has any difficulty. Patient verbalized an understanding.

## 2018-12-09 ENCOUNTER — Telehealth (INDEPENDENT_AMBULATORY_CARE_PROVIDER_SITE_OTHER): Payer: BC Managed Care – PPO | Admitting: Family Medicine

## 2018-12-09 ENCOUNTER — Other Ambulatory Visit: Payer: Self-pay

## 2018-12-09 DIAGNOSIS — E785 Hyperlipidemia, unspecified: Secondary | ICD-10-CM

## 2018-12-09 DIAGNOSIS — B001 Herpesviral vesicular dermatitis: Secondary | ICD-10-CM | POA: Diagnosis not present

## 2018-12-09 MED ORDER — VALACYCLOVIR HCL 1 G PO TABS: ORAL_TABLET | ORAL | 1 refills | Status: DC

## 2018-12-09 NOTE — Progress Notes (Signed)
This visit type was conducted due to national recommendations for restrictions regarding the COVID-19 pandemic in an effort to limit this patient's exposure and mitigate transmission in our community.   Virtual Visit via Telephone Note  I connected with [name] on 12/09/18 at 10:00 AM EDT by telephone and verified that I am speaking with the correct person using two identifiers.   I discussed the limitations, risks, security and privacy concerns of performing an evaluation and management service by telephone and the availability of in person appointments. I also discussed with the patient that there may be a patient responsible charge related to this service. The patient expressed understanding and agreed to proceed.  Location patient: home Location provider: work or home office Participants present for the call: patient, provider Patient did not have a visit in the prior 7 days to address this/these issue(s).   History of Present Illness:  Debbie Werner called for refills of acyclovir.  She has history of recurrent cold sores.  She had been taking acyclovir 400 mg twice daily as needed.  She usually gets these couple times or so per year.  She has had no history of intolerance with medication.  Recently started her on Livalo for her lipids.  She has tolerated well 1 mg 3 times weekly.  She has been instructed to get follow-up fasting lipids but she would like to try taking this daily to see if she can still tolerate.  Thus far, no myalgias   Observations/Objective: Patient sounds cheerful and well on the phone. I do not appreciate any SOB. Speech and thought processing are grossly intact. Patient reported vitals:  Assessment and Plan:  #1 history of recurrent cold sores -Recommend Valtrex 1 g take 2 at onset of cold sore on 212 hours later  #2 hyperlipidemia.  Prior intolerance with multiple statins.  Currently tolerating Livalo 1 mg 3 times weekly -Increase Livalo to daily and recheck  lipids in 2 to 3 weeks  Follow Up Instructions:  -As above   99441 5-10 99442 11-20 99443 21-30 I did not refer this patient for an OV in the next 24 hours for this/these issue(s).  I discussed the assessment and treatment plan with the patient. The patient was provided an opportunity to ask questions and all were answered. The patient agreed with the plan and demonstrated an understanding of the instructions.   The patient was advised to call back or seek an in-person evaluation if the symptoms worsen or if the condition fails to improve as anticipated.  I provided 15 minutes of non-face-to-face time during this encounter.   Carolann Littler, MD

## 2019-02-05 ENCOUNTER — Other Ambulatory Visit: Payer: Self-pay

## 2019-02-06 ENCOUNTER — Other Ambulatory Visit (INDEPENDENT_AMBULATORY_CARE_PROVIDER_SITE_OTHER): Payer: BC Managed Care – PPO

## 2019-02-06 DIAGNOSIS — E785 Hyperlipidemia, unspecified: Secondary | ICD-10-CM | POA: Diagnosis not present

## 2019-02-06 DIAGNOSIS — R739 Hyperglycemia, unspecified: Secondary | ICD-10-CM | POA: Diagnosis not present

## 2019-02-06 LAB — LIPID PANEL
Cholesterol: 221 mg/dL — ABNORMAL HIGH (ref 0–200)
HDL: 39.2 mg/dL (ref >39.00)
LDL Cholesterol: 151 mg/dL — ABNORMAL HIGH (ref 0–99)
NonHDL: 182.16
Total CHOL/HDL Ratio: 6
Triglycerides: 158 mg/dL — ABNORMAL HIGH (ref 0.0–149.0)
VLDL: 31.6 mg/dL (ref 0.0–40.0)

## 2019-02-06 LAB — HEPATIC FUNCTION PANEL
ALT: 21 U/L (ref 0–35)
AST: 17 U/L (ref 0–37)
Albumin: 4.1 g/dL (ref 3.5–5.2)
Alkaline Phosphatase: 80 U/L (ref 39–117)
Bilirubin, Direct: 0.1 mg/dL (ref 0.0–0.3)
Total Bilirubin: 0.6 mg/dL (ref 0.2–1.2)
Total Protein: 6.3 g/dL (ref 6.0–8.3)

## 2019-02-06 LAB — HEMOGLOBIN A1C: Hgb A1c MFr Bld: 5.5 % (ref 4.6–6.5)

## 2019-02-08 ENCOUNTER — Encounter: Payer: Self-pay | Admitting: Family Medicine

## 2019-02-10 ENCOUNTER — Other Ambulatory Visit: Payer: Self-pay

## 2019-02-10 MED ORDER — LIVALO 1 MG PO TABS: ORAL_TABLET | ORAL | 3 refills | Status: DC

## 2019-02-11 ENCOUNTER — Telehealth: Payer: Self-pay | Admitting: Family Medicine

## 2019-02-11 ENCOUNTER — Other Ambulatory Visit: Payer: Self-pay

## 2019-02-11 MED ORDER — ROSUVASTATIN CALCIUM 10 MG PO TABS: 10.0000 mg | ORAL_TABLET | ORAL | 3 refills | Status: DC

## 2019-02-11 NOTE — Telephone Encounter (Signed)
Copied from [place] [id number]. Topic: Quick Communication - Rx Refill/Question >> Feb 11, 2019  8:54 AM Rainey Pines A wrote: Medication: rosuvastatin (CRESTOR) 10 MG tablet (Patient is completely out of medication )  Has the patient contacted their pharmacy? Yes  (Agent: If no, request that the patient contact the pharmacy for the refill.) (Agent: If yes, when and what did the pharmacy advise?)Contact PCP  Preferred Pharmacy (with phone number or street name): [place], [address]  Phone:  [phone] Fax:  [phone]     Agent: Please be advised that RX refills may take up to 3 business days. We ask that you follow-up with your pharmacy.

## 2019-02-11 NOTE — Telephone Encounter (Signed)
Message Routed to PCP CMA 

## 2019-02-11 NOTE — Telephone Encounter (Signed)
This is not on the current med list. I sent in [place] per her MyChart message. Called patient and she is taking rosuvastatin 10 mg every other day and I will send in a refill to her pharmacy. Patient stated that she is not able to take every day because it makes her joints hurt too much

## 2019-06-16 ENCOUNTER — Other Ambulatory Visit: Payer: Self-pay | Admitting: Family Medicine

## 2019-07-17 ENCOUNTER — Other Ambulatory Visit: Payer: Self-pay | Admitting: Family Medicine

## 2019-07-22 ENCOUNTER — Other Ambulatory Visit: Payer: Self-pay | Admitting: Family Medicine

## 2019-07-27 NOTE — Telephone Encounter (Signed)
The patient called to check on a refill request from 06/02 for lisinopril (ZESTRIL) 20 MG tablet     I advised the patient that this Rx was refilled 04/27 for 90 tablets and she stated that now that I said that she probably has 1 or 2 bottles of this Rx in her cabinet at home. She will call back after she gets home if she needs the refill.

## 2019-09-22 ENCOUNTER — Other Ambulatory Visit: Payer: Self-pay | Admitting: Family Medicine

## 2019-10-08 DIAGNOSIS — Z1231 Encounter for screening mammogram for malignant neoplasm of breast: Secondary | ICD-10-CM | POA: Diagnosis not present

## 2019-10-08 LAB — HM MAMMOGRAPHY

## 2019-10-15 DIAGNOSIS — L570 Actinic keratosis: Secondary | ICD-10-CM | POA: Diagnosis not present

## 2019-10-15 DIAGNOSIS — X32XXXD Exposure to sunlight, subsequent encounter: Secondary | ICD-10-CM | POA: Diagnosis not present

## 2019-10-15 DIAGNOSIS — C44311 Basal cell carcinoma of skin of nose: Secondary | ICD-10-CM | POA: Diagnosis not present

## 2019-10-15 DIAGNOSIS — Z08 Encounter for follow-up examination after completed treatment for malignant neoplasm: Secondary | ICD-10-CM | POA: Diagnosis not present

## 2019-10-15 DIAGNOSIS — Z85820 Personal history of malignant melanoma of skin: Secondary | ICD-10-CM | POA: Diagnosis not present

## 2019-10-15 DIAGNOSIS — D225 Melanocytic nevi of trunk: Secondary | ICD-10-CM | POA: Diagnosis not present

## 2019-10-15 DIAGNOSIS — C44519 Basal cell carcinoma of skin of other part of trunk: Secondary | ICD-10-CM | POA: Diagnosis not present

## 2019-11-04 ENCOUNTER — Encounter: Payer: Self-pay | Admitting: Family Medicine

## 2019-12-24 ENCOUNTER — Other Ambulatory Visit: Payer: Self-pay | Admitting: Family Medicine

## 2019-12-26 ENCOUNTER — Observation Stay (HOSPITAL_COMMUNITY)
Admission: EM | Admit: 2019-12-26 | Discharge: 2019-12-27 | Disposition: A | Discharge status: Home / Self Care | Payer: PPO | Attending: Internal Medicine | Admitting: Internal Medicine

## 2019-12-26 ENCOUNTER — Other Ambulatory Visit: Payer: Self-pay

## 2019-12-26 ENCOUNTER — Emergency Department (HOSPITAL_COMMUNITY): Payer: PPO

## 2019-12-26 ENCOUNTER — Encounter (HOSPITAL_COMMUNITY): Payer: Self-pay | Admitting: Emergency Medicine

## 2019-12-26 DIAGNOSIS — R079 Chest pain, unspecified: Secondary | ICD-10-CM | POA: Diagnosis not present

## 2019-12-26 DIAGNOSIS — I259 Chronic ischemic heart disease, unspecified: Secondary | ICD-10-CM

## 2019-12-26 DIAGNOSIS — R0789 Other chest pain: Secondary | ICD-10-CM | POA: Diagnosis not present

## 2019-12-26 DIAGNOSIS — R0602 Shortness of breath: Secondary | ICD-10-CM | POA: Diagnosis not present

## 2019-12-26 DIAGNOSIS — R42 Dizziness and giddiness: Secondary | ICD-10-CM | POA: Diagnosis not present

## 2019-12-26 DIAGNOSIS — Z136 Encounter for screening for cardiovascular disorders: Secondary | ICD-10-CM | POA: Diagnosis not present

## 2019-12-26 DIAGNOSIS — Z20822 Contact with and (suspected) exposure to covid-19: Secondary | ICD-10-CM | POA: Insufficient documentation

## 2019-12-26 DIAGNOSIS — Z85828 Personal history of other malignant neoplasm of skin: Secondary | ICD-10-CM | POA: Insufficient documentation

## 2019-12-26 DIAGNOSIS — J90 Pleural effusion, not elsewhere classified: Secondary | ICD-10-CM | POA: Diagnosis not present

## 2019-12-26 DIAGNOSIS — R110 Nausea: Secondary | ICD-10-CM | POA: Diagnosis not present

## 2019-12-26 DIAGNOSIS — I10 Essential (primary) hypertension: Secondary | ICD-10-CM | POA: Diagnosis not present

## 2019-12-26 DIAGNOSIS — Z79899 Other long term (current) drug therapy: Secondary | ICD-10-CM | POA: Diagnosis not present

## 2019-12-26 DIAGNOSIS — R072 Precordial pain: Secondary | ICD-10-CM | POA: Diagnosis not present

## 2019-12-26 LAB — BASIC METABOLIC PANEL
Anion gap: 8 (ref 5–15)
BUN: 12 mg/dL (ref 8–23)
CO2: 25 mmol/L (ref 22–32)
Calcium: 9.2 mg/dL (ref 8.9–10.3)
Chloride: 107 mmol/L (ref 98–111)
Creatinine, Ser: 0.89 mg/dL (ref 0.44–1.00)
GFR, Estimated: >60 mL/min (ref >60)
Glucose, Bld: 103 mg/dL — ABNORMAL HIGH (ref 70–99)
Potassium: 4.3 mmol/L (ref 3.5–5.1)
Sodium: 140 mmol/L (ref 135–145)

## 2019-12-26 LAB — TROPONIN I (HIGH SENSITIVITY)
Troponin I (High Sensitivity): 3 ng/L (ref <18)
Troponin I (High Sensitivity): 3 ng/L (ref <18)

## 2019-12-26 LAB — CBC
HCT: 43.2 % (ref 36.0–46.0)
Hemoglobin: 13.7 g/dL (ref 12.0–15.0)
MCH: 29.5 pg (ref 26.0–34.0)
MCHC: 31.7 g/dL (ref 30.0–36.0)
MCV: 92.9 fL (ref 80.0–100.0)
Platelets: 340 10*3/uL (ref 150–400)
RBC: 4.65 MIL/uL (ref 3.87–5.11)
RDW: 12.8 % (ref 11.5–15.5)
WBC: 9.6 10*3/uL (ref 4.0–10.5)
nRBC: 0 % (ref 0.0–0.2)

## 2019-12-26 MED ORDER — ONDANSETRON HCL 4 MG/2ML IJ SOLN: 4.0000 mg | Freq: Four times a day (QID) | INTRAMUSCULAR | Status: DC | PRN

## 2019-12-26 MED ORDER — ASPIRIN EC 81 MG PO TBEC
81.0000 mg | DELAYED_RELEASE_TABLET | Freq: Every day | ORAL | Status: DC
  Administered 2019-12-26 – 2019-12-27 (×2): 81 mg via ORAL
  Filled 2019-12-26 (×2): qty 1

## 2019-12-26 MED ORDER — ENOXAPARIN SODIUM 40 MG/0.4ML ~~LOC~~ SOLN
40.0000 mg | SUBCUTANEOUS | Status: DC
  Administered 2019-12-26: 40 mg via SUBCUTANEOUS
  Filled 2019-12-26 (×2): qty 0.4

## 2019-12-26 MED ORDER — LISINOPRIL 20 MG PO TABS
20.0000 mg | ORAL_TABLET | Freq: Every day | ORAL | Status: DC
  Administered 2019-12-26 – 2019-12-27 (×2): 20 mg via ORAL
  Filled 2019-12-26 (×2): qty 1

## 2019-12-26 MED ORDER — HYDROCHLOROTHIAZIDE 12.5 MG PO CAPS
12.5000 mg | ORAL_CAPSULE | Freq: Every day | ORAL | Status: DC
  Administered 2019-12-27 (×2): 12.5 mg via ORAL
  Filled 2019-12-26 (×2): qty 1

## 2019-12-26 MED ORDER — NITROGLYCERIN 0.4 MG SL SUBL
0.4000 mg | SUBLINGUAL_TABLET | SUBLINGUAL | Status: DC | PRN
  Administered 2019-12-26: 0.4 mg via SUBLINGUAL
  Filled 2019-12-26: qty 1

## 2019-12-26 MED ORDER — HYDRALAZINE HCL 25 MG PO TABS
25.0000 mg | ORAL_TABLET | Freq: Four times a day (QID) | ORAL | Status: DC | PRN
  Administered 2019-12-27: 25 mg via ORAL
  Filled 2019-12-26: qty 1

## 2019-12-26 MED ORDER — ACETAMINOPHEN 325 MG PO TABS
650.0000 mg | ORAL_TABLET | ORAL | Status: DC | PRN
  Administered 2019-12-27 (×2): 650 mg via ORAL
  Filled 2019-12-26 (×2): qty 2

## 2019-12-26 NOTE — ED Notes (Signed)
BP Left arm 185/92, BP Right arm 183/92

## 2019-12-26 NOTE — ED Triage Notes (Signed)
Pt to triage via [facility] from Dr's office.  Reports chest pain since Tuesday that radiates to bilateral shoulders.  Also reports nausea and SOB.  NTG x 3 and ASA 324mg  given PTA.  Pain decreased from 9/10 to 1/10.  20g IV R hand.

## 2019-12-26 NOTE — ED Notes (Signed)
Pt denies any chest pain at this time 

## 2019-12-26 NOTE — ED Provider Notes (Signed)
[facility] EMERGENCY DEPARTMENT Provider Note   CSN: [id number] Arrival date & time: 12/26/19  1532     History Chief Complaint  Patient presents with  . Chest Pain    Debbie Werner is a 64 y.o. female past medical history of hypertension, hyperlipidemia, presenting to the emergency department with complaint of chest pain that began on Tuesday. She reports symptoms have been intermittent since Tuesday, worsening today. She was seen at [facility] today with normal EKG however was sent her due to symptoms. EMS administered NTG x3 and 324 ASA in route with significant improvement in symptoms. Patient reports substernal chest pain feels like pressure radiating to both shoulders which felt heavy. Feels SOB as if can't take deep breath and nausea with the chest pain.  Chest pain is both exertional and sometimes occurs at rest.  It lasts a few minutes in duration.  Has history of GERD though symptoms do not feel the same.  No known cardiac history.  Denies assoc cough, fever, weakness, abd pain. Not on anticoagulation. No hx of DVT/PE, no recent travel/immobilization, hx cancer.  Has had COVID vaccines.   The history is provided by the patient.    HPI: A 64 year old patient with a history of hypertension, hypercholesterolemia and obesity presents for evaluation of chest pain. Initial onset of pain was less than one hour ago. The patient's chest pain is described as heaviness/pressure/tightness, is worse with exertion and is relieved by nitroglycerin. The patient complains of nausea. The patient's chest pain is middle- or left-sided, is not well-localized, is not sharp and does radiate to the arms/jaw/neck. The patient denies diaphoresis. The patient has no history of stroke, has no history of peripheral artery disease, has not smoked in the past 90 days, denies any history of treated diabetes and has no relevant family history of coronary artery disease (first degree relative at less than  age 40).   Past Medical History:  Diagnosis Date  . ALLERGIC RHINITIS 08/03/2008  . Cancer ([place])    Skin - has had "spots" removed for years  . CHEST PAIN, ATYPICAL 10/12/2008  . CONSTIPATION, CHRONIC 12/08/2009  . DEPRESSION 12/08/2009  . GERD 08/03/2008  . Gestational diabetes   . HEMATOCHEZIA 05/02/2009  . HYPERLIPIDEMIA 08/03/2008  . HYPERTENSION 08/03/2008  . Obesity   . SINUSITIS, CHRONIC 02/24/2009    Patient Active Problem List   Diagnosis Date Noted  . Chest pain 12/26/2019  . 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 Chronic insomnia 05/12/2015  . Obesity (BMI 30-39.9) 12/04/2012  . Inflamed external hemorrhoid 09/18/2011  . DEPRESSION 12/08/2009  . CONSTIPATION, CHRONIC 12/08/2009  . HEMATOCHEZIA 05/02/2009  . SINUSITIS, CHRONIC 02/24/2009  . Chest pain at rest 10/12/2008  . Hyperlipidemia 08/03/2008  . Essential hypertension 08/03/2008  . ALLERGIC RHINITIS 08/03/2008  . GERD 08/03/2008    Past Surgical History:  Procedure Laterality Date  . BREAST SURGERY     bx  . BREATH TEK H PYLORI  12/21/2011   Procedure: BREATH TEK H PYLORI;  Surgeon: Pedro Earls, MD;  Location: [facility];  Service: General;  Laterality: N/A;  Katy/Andrea  . BUNIONECTOMY Left   . CYSTECTOMY  1996   ovary  . METATARSAL OSTEOTOMY Left   . NEURECTOMY FOOT Left   . TUBAL LIGATION       OB History   No obstetric history on file.     Family History  Problem Relation Age of Onset  . Heart disease Father 71  CABG  . Hyperlipidemia Neg Hx        parent, grandparent  . Hypertension Neg Hx        parent , grandparent  . Stroke Neg Hx        grandparent  . Diabetes Neg Hx        grandparent    Social History   Tobacco Use  . Smoking status: Never Smoker  . Smokeless tobacco: Never Used  Substance Use Topics  . Alcohol use: No    Alcohol/week: 0.0 standard drinks  . Drug use: No    Home Medications Prior to Admission medications   Medication Sig Start Date End Date  Taking? Authorizing Provider  lisinopril (ZESTRIL) 20 MG tablet Take 1 tablet by mouth once daily Patient taking differently: Take 20 mg by mouth daily.  12/25/19  Yes Burchette, Alinda Sierras, MD  Diclofenac Sodium 3 % GEL Place 1 application onto the skin 4 (four) times daily as needed. Patient not taking: Reported on 12/26/2019 04/10/16   Eulas Post, MD  rosuvastatin (CRESTOR) 10 MG tablet Take 1 tablet (10 mg total) by mouth every other day. Patient not taking: Reported on 12/26/2019 02/11/19   Eulas Post, MD  valACYclovir (VALTREX) 1000 MG tablet Take 2 tablets by mouth at onset of cold sore and repeat 2 in 12 hours Patient not taking: Reported on 12/26/2019 12/09/18   Eulas Post, MD    Allergies    Anesthetics, amide; Pravastatin sodium; and Sulfadiazine  Review of Systems   Review of Systems  Respiratory: Positive for shortness of breath.   Cardiovascular: Positive for chest pain.  Gastrointestinal: Positive for nausea.  All other systems reviewed and are negative.   Physical Exam Updated Vital Signs BP (!) 173/96   Pulse 73   Temp 98.2 F (36.8 C) (Oral)   Resp 13   Ht 5\' 1"  (1.549 m)   Wt 74.8 kg   SpO2 100%   BMI 31.18 kg/m   Physical Exam Vitals and nursing note reviewed.  Constitutional:      General: She is not in acute distress.    Appearance: She is well-developed. She is not ill-appearing.  HENT:     Head: Normocephalic and atraumatic.  Eyes:     Conjunctiva/sclera: Conjunctivae normal.  Cardiovascular:     Rate and Rhythm: Normal rate and regular rhythm.  Pulmonary:     Effort: Pulmonary effort is normal.     Breath sounds: Normal breath sounds.  Chest:     Chest wall: No tenderness.  Abdominal:     General: Bowel sounds are normal.     Palpations: Abdomen is soft.     Tenderness: There is no abdominal tenderness.  Musculoskeletal:     Right lower leg: No edema.     Left lower leg: No edema.  Skin:    General: Skin is warm.    Neurological:     Mental Status: She is alert.  Psychiatric:        Behavior: Behavior normal.     ED Results / Procedures / Treatments   Labs (all labs ordered are listed, but only abnormal results are displayed) Labs Reviewed  BASIC METABOLIC PANEL - Abnormal; Notable for the following components:      Result Value   Glucose, Bld 103 (*)    All other components within normal limits  RESPIRATORY PANEL BY RT PCR (FLU A&B, COVID)  CBC  TSH  LIPID PANEL  TROPONIN I (HIGH  SENSITIVITY)  TROPONIN I (HIGH SENSITIVITY)    EKG EKG Interpretation  Date/Time:  Saturday December 26 2019 15:43:42 EDT Ventricular Rate:  75 PR Interval:  146 QRS Duration: 66 QT Interval:  386 QTC Calculation: 431 R Axis:   29 Text Interpretation: Normal sinus rhythm Normal ECG Confirmed by Davonna Belling [phone]) on 12/26/2019 5:59:09 PM   Radiology DG Chest 2 View  Result Date: 12/26/2019 CLINICAL DATA:  Chest pain, nausea and shortness of breath. EXAM: CHEST - 2 VIEW COMPARISON:  Chest radiograph 12/13/2011 FINDINGS: Stable cardiomediastinal contours. The lungs are clear. No pneumothorax or pleural effusion. No acute finding in the visualized skeleton. Small rounded opacity projecting over the left lung apex favored to be artifactual, external to the patient. IMPRESSION: 1.  No acute cardiopulmonary process. 2. Small round opacity projecting over the left lung apex favored to be artifactual/external to the patient. Electronically Signed   By: Audie Pinto M.D.   On: 12/26/2019 16:49    Procedures Procedures (including critical care time)  Medications Ordered in ED Medications  nitroGLYCERIN (NITROSTAT) SL tablet 0.4 mg (0.4 mg Sublingual Given 12/26/19 1655)  lisinopril (ZESTRIL) tablet 20 mg (has no administration in time range)  hydrochlorothiazide (MICROZIDE) capsule 12.5 mg (has no administration in time range)  acetaminophen (TYLENOL) tablet 650 mg (has no administration in time  range)  ondansetron (ZOFRAN) injection 4 mg (has no administration in time range)  enoxaparin (LOVENOX) injection 40 mg (has no administration in time range)  hydrALAZINE (APRESOLINE) tablet 25 mg (has no administration in time range)  aspirin EC tablet 81 mg (has no administration in time range)    ED Course  I have reviewed the triage vital signs and the nursing notes.  Pertinent labs & imaging results that were available during my care of the patient were reviewed by me and considered in my medical decision making (see chart for details).    MDM Rules/Calculators/A&P HEAR Score: 6                        Patient with past medical history of hypertension, hyperlipidemia, presenting with multiple days of intermittent substernal chest pain.  She has associated shortness of breath and nausea with the pain which is described as a heaviness/pressure sensation.  Pain occurs both with exertion and at rest.  No known cardiac history though per chart review it appears she has not had cardiac work-up outpatient since 2004.  She was given 324 mg of aspirin and 3x nitroglycerin in route which provided significant improvement in symptoms.  She is noted to be hypertensive on arrival 180s over 90s.  Pain is somewhat returning on evaluation.  She is in no acute distress.  EKG appears nonischemic.  Initial troponin is normal.  Given heart score of 6 and concerning presentation for possible unstable angina, patient will be admitted for further cardiac work-up.  Patient is agreeable with this plan.  Final Clinical Impression(s) / ED Diagnoses Final diagnoses:  Substernal chest pain    Rx / DC Orders ED Discharge Orders    None       Brexlee Heberlein, [place] N, PA-C 12/26/19 Patrecia Pace, MD 12/27/19 0003

## 2019-12-26 NOTE — ED Notes (Signed)
Nitro .4mg  SL given. Pt states "pain is the same"

## 2019-12-26 NOTE — H&P (Signed)
History and Physical    Debbie Werner ZOX:[id number] DOB: 23-May-1955 DOA: 12/26/2019  PCP: Eulas Post, MD (Confirm with patient/family/NH records and if not entered, this has to be entered at [facility] point of entry) Patient coming from: Home  I have personally briefly reviewed patient's old medical records in [place]  Chief Complaint: Chest pain  HPI: Debbie Werner is a 64 y.o. female with medical history significant of HTN, HLD not able to tolerated statins, obesity, presented with chest pains. First episode started Tuesday night, woke her up in the middle of night, pressure-like, middle of the chest, nonradiating, pressure-like, 5-6/10, associated with shortness of breath lasted about 30 minutes, subsided by its own and she was able to sleep for the rest of the night. And over the last 2 days, she experienced several episodes as such, similar in nature, not associated with activities. This afternoon, while sitting, she had another episode, this time lasted about 1 hour gradually getting worse to 10/10. She went to urgent care, was found her blood pressure over 190s, she was given aspirin and nitroglycerin and the chest pain subsided in few minutes. She is non-smoker. ED Course: Significant elevation of blood pressure, systolic one 811-914N. First set troponin negative, EKG showed no ST-T changes  Review of Systems: As per HPI otherwise 14 point review of systems negative.    Past Medical History:  Diagnosis Date  . ALLERGIC RHINITIS 08/03/2008  . Cancer ([place])    Skin - has had "spots" removed for years  . CHEST PAIN, ATYPICAL 10/12/2008  . CONSTIPATION, CHRONIC 12/08/2009  . DEPRESSION 12/08/2009  . GERD 08/03/2008  . Gestational diabetes   . HEMATOCHEZIA 05/02/2009  . HYPERLIPIDEMIA 08/03/2008  . HYPERTENSION 08/03/2008  . Obesity   . SINUSITIS, CHRONIC 02/24/2009    Past Surgical History:  Procedure Laterality Date  . BREAST SURGERY     bx  . BREATH TEK H PYLORI   12/21/2011   Procedure: BREATH TEK H PYLORI;  Surgeon: Pedro Earls, MD;  Location: [facility];  Service: General;  Laterality: N/A;  Katy/Andrea  . BUNIONECTOMY Left   . CYSTECTOMY  1996   [place]  . METATARSAL OSTEOTOMY Left   . NEURECTOMY FOOT Left   . TUBAL LIGATION       reports that she has never smoked. She has never used smokeless tobacco. She reports that she does not drink alcohol and does not use drugs.  Allergies  Allergen Reactions  . Anesthetics, Amide Nausea Only  . Pravastatin Sodium     REACTION: effects brain memory  . Sulfadiazine     GI upset    Family History  Problem Relation Age of Onset  . Heart disease Father 62       CABG  . Hyperlipidemia Neg Hx        parent, grandparent  . Hypertension Neg Hx        parent , grandparent  . Stroke Neg Hx        grandparent  . Diabetes Neg Hx        grandparent     Prior to Admission medications   Medication Sig Start Date End Date Taking? Authorizing Provider  lisinopril (ZESTRIL) 20 MG tablet Take 1 tablet by mouth once daily Patient taking differently: Take 20 mg by mouth daily.  12/25/19  Yes Burchette, Alinda Sierras, MD  Diclofenac Sodium 3 % GEL Place 1 application onto the skin 4 (four) times daily as needed. Patient  not taking: Reported on 12/26/2019 04/10/16   Eulas Post, MD  rosuvastatin (CRESTOR) 10 MG tablet Take 1 tablet (10 mg total) by mouth every other day. Patient not taking: Reported on 12/26/2019 02/11/19   Eulas Post, MD  valACYclovir (VALTREX) 1000 MG tablet Take 2 tablets by mouth at onset of cold sore and repeat 2 in 12 hours Patient not taking: Reported on 12/26/2019 12/09/18   Eulas Post, MD    Physical Exam: Vitals:   12/26/19 1730 12/26/19 1745 12/26/19 1800 12/26/19 1815  BP: (!) 158/87 (!) 161/93 (!) 152/82 (!) 163/77  Pulse: 69 68 66 68  Resp: 12 18 14 18   Temp:      TempSrc:      SpO2: 100% 98% 96% 98%  Weight:      Height:        Constitutional:  NAD, calm, comfortable Vitals:   12/26/19 1730 12/26/19 1745 12/26/19 1800 12/26/19 1815  BP: (!) 158/87 (!) 161/93 (!) 152/82 (!) 163/77  Pulse: 69 68 66 68  Resp: 12 18 14 18   Temp:      TempSrc:      SpO2: 100% 98% 96% 98%  Weight:      Height:       Eyes: PERRL, lids and conjunctivae normal ENMT: Mucous membranes are moist. Posterior pharynx clear of any exudate or lesions.Normal dentition.  Neck: normal, supple, no masses, no thyromegaly Respiratory: clear to auscultation bilaterally, no wheezing, no crackles. Normal respiratory effort. No accessory muscle use.  Cardiovascular: Regular rate and rhythm, no murmurs / rubs / gallops. No extremity edema. 2+ pedal pulses. No carotid bruits.  Abdomen: no tenderness, no masses palpated. No hepatosplenomegaly. Bowel sounds positive.  Musculoskeletal: no clubbing / cyanosis. No joint deformity upper and lower extremities. Good ROM, no contractures. Normal muscle tone.  Skin: no rashes, lesions, ulcers. No induration Neurologic: CN 2-12 grossly intact. Sensation intact, DTR normal. Strength 5/5 in all 4.  Psychiatric: Normal judgment and insight. Alert and oriented x 3. Normal mood.    Labs on Admission: I have personally reviewed following labs and imaging studies  CBC: Recent Labs  Lab 12/26/19 1558  WBC 9.6  HGB 13.7  HCT 43.2  MCV 92.9  PLT 993   Basic Metabolic Panel: Recent Labs  Lab 12/26/19 1558  NA 140  K 4.3  CL 107  CO2 25  GLUCOSE 103*  BUN 12  CREATININE 0.89  CALCIUM 9.2   GFR: Estimated Creatinine Clearance: 58.3 mL/min (by C-G formula based on SCr of 0.89 mg/dL). Liver Function Tests: No results for input(s): AST, ALT, ALKPHOS, BILITOT, PROT, ALBUMIN in the last 168 hours. No results for input(s): LIPASE, AMYLASE in the last 168 hours. No results for input(s): AMMONIA in the last 168 hours. Coagulation Profile: No results for input(s): INR, PROTIME in the last 168 hours. Cardiac Enzymes: No results  for input(s): CKTOTAL, CKMB, CKMBINDEX, TROPONINI in the last 168 hours. BNP (last 3 results) No results for input(s): PROBNP in the last 8760 hours. HbA1C: No results for input(s): HGBA1C in the last 72 hours. CBG: No results for input(s): GLUCAP in the last 168 hours. Lipid Profile: No results for input(s): CHOL, HDL, LDLCALC, TRIG, CHOLHDL, LDLDIRECT in the last 72 hours. Thyroid Function Tests: No results for input(s): TSH, T4TOTAL, FREET4, T3FREE, THYROIDAB in the last 72 hours. Anemia Panel: No results for input(s): VITAMINB12, FOLATE, FERRITIN, TIBC, IRON, RETICCTPCT in the last 72 hours. Urine analysis:  Component Value Date/Time   COLORURINE YELLOW 01/06/2007 [place] 01/06/2007 1245   LABSPEC 1.010 01/06/2007 1245   PHURINE 5.5 01/06/2007 1245   GLUCOSEU NEGATIVE 01/06/2007 1245   HGBUR NEGATIVE 01/06/2007 1245   BILIRUBINUR neg 10/29/2011 1213   KETONESUR NEGATIVE 01/06/2007 1245   PROTEINUR neg 10/29/2011 1213   PROTEINUR NEGATIVE 01/06/2007 1245   UROBILINOGEN 0.2 10/29/2011 1213   UROBILINOGEN 0.2 01/06/2007 1245   NITRITE neg 10/29/2011 1213   NITRITE NEGATIVE 01/06/2007 1245   LEUKOCYTESUR Negative 10/29/2011 1213    Radiological Exams on Admission: DG Chest 2 View  Result Date: 12/26/2019 CLINICAL DATA:  Chest pain, nausea and shortness of breath. EXAM: CHEST - 2 VIEW COMPARISON:  Chest radiograph 12/13/2011 FINDINGS: Stable cardiomediastinal contours. The lungs are clear. No pneumothorax or pleural effusion. No acute finding in the visualized skeleton. Small rounded opacity projecting over the left lung apex favored to be artifactual, external to the patient. IMPRESSION: 1.  No acute cardiopulmonary process. 2. Small round opacity projecting over the left lung apex favored to be artifactual/external to the patient. Electronically Signed   By: Audie Pinto M.D.   On: 12/26/2019 16:49    EKG: Independently reviewed. Sinus rhythm, no acute  ST-T changes.  Assessment/Plan Active Problems:   Chest pain at rest   Chest pain  (please populate well all problems here in Problem List. (For example, if patient is on BP meds at home and you resume or decide to hold them, it is a problem that needs to be her. Same for CAD, COPD, HLD and so on)  Chest pain rule out ACS -Follow-up second set of troponins. -Suspect uncontrolled hypertension related angina. Continue lisinopril, add HCTZ. As needed hydralazine. -Start aspirin, history of not tolerating statins, recommend she follow-up with her PCP to discuss about alternative lipid-lowering treatment options. -10 years ASCVD score 12.9, will order Coronary CTA to further evaluate, given multiple such episodes repeated in the last several days.  -Echo  Uncontrolled hypertension -As above, add HCTZ, as needed hydralazine  HLD -Recheck lipid panel.  DVT prophylaxis: Lovenox Code Status: Full code Family Communication: None at bedside Disposition Plan: Expect less than two midnight hospital stay to rule out signs and CAD work-up. Consults called: Consider cardiology consult if any new study positive. Admission status: Telemetry observation   Lequita Halt MD [facility] Pager [phone]  12/26/2019, 6:54 PM

## 2019-12-27 ENCOUNTER — Observation Stay (HOSPITAL_COMMUNITY): Payer: PPO

## 2019-12-27 ENCOUNTER — Observation Stay (HOSPITAL_BASED_OUTPATIENT_CLINIC_OR_DEPARTMENT_OTHER): Payer: PPO

## 2019-12-27 DIAGNOSIS — R079 Chest pain, unspecified: Secondary | ICD-10-CM

## 2019-12-27 LAB — ECHOCARDIOGRAM COMPLETE
AR max vel: 2.27 cm2
AV Area VTI: 2.29 cm2
AV Area mean vel: 2.17 cm2
AV Mean grad: 5.6 mmHg
AV Peak grad: 10.7 mmHg
Ao pk vel: 1.64 m/s
Area-P 1/2: 3.42 cm2
Calc EF: 66.1 %
Height: 61 in
S' Lateral: 2.5 cm
Single Plane A2C EF: 61.6 %
Single Plane A4C EF: 69.8 %
Weight: 2640 oz

## 2019-12-27 LAB — RESPIRATORY PANEL BY RT PCR (FLU A&B, COVID)
Influenza A by PCR: NEGATIVE
Influenza B by PCR: NEGATIVE
SARS Coronavirus 2 by RT PCR: NEGATIVE

## 2019-12-27 LAB — LIPID PANEL
Cholesterol: 243 mg/dL — ABNORMAL HIGH (ref 0–200)
HDL: 34 mg/dL — ABNORMAL LOW (ref >40)
LDL Cholesterol: 178 mg/dL — ABNORMAL HIGH (ref 0–99)
Total CHOL/HDL Ratio: 7.1 RATIO
Triglycerides: 154 mg/dL — ABNORMAL HIGH (ref <150)
VLDL: 31 mg/dL (ref 0–40)

## 2019-12-27 LAB — TSH: TSH: 8.887 u[IU]/mL — ABNORMAL HIGH (ref 0.350–4.500)

## 2019-12-27 MED ORDER — ROSUVASTATIN CALCIUM 40 MG PO TABS: 40.0000 mg | ORAL_TABLET | ORAL | 0 refills | Status: DC

## 2019-12-27 MED ORDER — METOPROLOL TARTRATE 50 MG PO TABS
50.0000 mg | ORAL_TABLET | Freq: Once | ORAL | Status: AC
  Administered 2019-12-27: 50 mg via ORAL
  Filled 2019-12-27: qty 1

## 2019-12-27 MED ORDER — ASPIRIN 81 MG PO TBEC
81.0000 mg | DELAYED_RELEASE_TABLET | Freq: Every day | ORAL | 11 refills | Status: DC
Start: 2019-12-28 — End: 2021-03-10

## 2019-12-27 MED ORDER — NITROGLYCERIN 0.4 MG SL SUBL
SUBLINGUAL_TABLET | SUBLINGUAL | Status: AC
  Filled 2019-12-27: qty 2

## 2019-12-27 MED ORDER — HYDROCHLOROTHIAZIDE 12.5 MG PO CAPS
12.5000 mg | ORAL_CAPSULE | Freq: Every day | ORAL | 0 refills | Status: DC
Start: 2019-12-28 — End: 2021-10-09

## 2019-12-27 MED ORDER — IOHEXOL 350 MG/ML SOLN
80.0000 mL | Freq: Once | INTRAVENOUS | Status: AC | PRN
  Administered 2019-12-27: 80 mL via INTRAVENOUS

## 2019-12-27 MED ORDER — NITROGLYCERIN 0.4 MG SL SUBL
0.8000 mg | SUBLINGUAL_TABLET | Freq: Once | SUBLINGUAL | Status: AC
  Administered 2019-12-27: 0.8 mg via SUBLINGUAL

## 2019-12-27 NOTE — Plan of Care (Signed)

## 2019-12-27 NOTE — ED Notes (Signed)
Pt did not want to take hydrochlorothiazide without speaking with provider to find out the reasoning of why it was ordered. Pt stated that she only takes lisinopril at night for her BP. Advised pt that this nurse would speak with provider.

## 2019-12-27 NOTE — Progress Notes (Signed)
Pt arrived from [facility]. A&Ox4. No complaints of pain. VSS. No Skin breakdown. Low fall risk. Gen plan of care initiated.

## 2019-12-27 NOTE — ED Notes (Signed)
Report given to Fred,RN.  

## 2019-12-27 NOTE — Discharge Summary (Signed)
Physician Discharge Summary  Debbie Werner FMB:[id number] DOB: [birth year] DOA: 12/26/2019  PCP: Eulas Post, MD  Admit date: 12/26/2019 Discharge date: 12/27/2019  Admitted From: Home Disposition:  Home  Recommendations for Outpatient Follow-up:  1. Follow up with PCP in 1-2 weeks 2. Follow-up with cardiology in 1 week as discussed  Home Health: None Equipment/Devices: None  Discharge Condition: Stable CODE STATUS: Full Diet recommendation: Low-fat low-salt cardiac diet  Brief/Interim Summary: Debbie Werner is a 64 y.o. female with medical history significant of HTN, HLD not able to tolerated statins, obesity, presented with chest pains. First episode started Tuesday night, woke her up in the middle of night, pressure-like, middle of the chest, nonradiating, pressure-like, 5-6/10, associated with shortness of breath lasted about 30 minutes, subsided by its own and she was able to sleep for the rest of the night. And over the last 2 days, she experienced several episodes as such, similar in nature, not associated with activities. This afternoon, while sitting, she had another episode, this time lasted about 1 hour gradually getting worse to 10/10. She went to urgent care, was found her blood pressure over 190s, she was given aspirin and nitroglycerin and the chest pain subsided in few minutes. She is non-smoker. In ED: Significant elevation of blood pressure, systolic one [id number]. First set troponin negative, EKG showed no ST-T changes  Patient admitted for chest pressure found to have hypertensive emergency with uncontrolled blood pressure.  Upon further evaluation patient's trop negative, ekg unremarkable.  Her CT calcium angiography score was elevated above 300, recommended to continue aspirin and increase Crestor to 40 mg daily with close follow-up with PCP and cardiology within 1 week as discussed.  Patient otherwise is asymptomatic given other negative findings patient will  follow up with PCP for further evaluation of atypical chest pain unlikely cardiac in nature, follow-up with cardiology for further discussion about echocardiogram as well as coronary calcium score.  Discharge Diagnoses:  Active Problems:   Chest pain at rest   Chest pain    Discharge Instructions  Discharge Instructions    Call MD for:  difficulty breathing, headache or visual disturbances   Complete by: As directed    Call MD for:  persistant dizziness or light-headedness   Complete by: As directed    Call MD for:  persistant nausea and vomiting   Complete by: As directed    Call MD for:  severe uncontrolled pain   Complete by: As directed    Diet - low sodium heart healthy   Complete by: As directed    Increase activity slowly   Complete by: As directed      Allergies as of 12/27/2019      Reactions   Anesthetics, Amide Nausea Only   Pravastatin Sodium    REACTION: effects brain memory   Sulfadiazine    GI upset      Medication List    TAKE these medications   aspirin 81 MG EC tablet Take 1 tablet (81 mg total) by mouth daily. Swallow whole. Start taking on: December 28, 2019   Diclofenac Sodium 3 % Gel Place 1 application onto the skin 4 (four) times daily as needed.   hydrochlorothiazide 12.5 MG capsule Commonly known as: MICROZIDE Take 1 capsule (12.5 mg total) by mouth daily. Start taking on: December 28, 2019   lisinopril 20 MG tablet Commonly known as: ZESTRIL Take 1 tablet by mouth once daily   rosuvastatin 40 MG tablet Commonly known as: Crestor  Take 1 tablet (40 mg total) by mouth every other day. What changed:   medication strength  how much to take   valACYclovir 1000 MG tablet Commonly known as: Valtrex Take 2 tablets by mouth at onset of cold sore and repeat 2 in 12 hours       Allergies  Allergen Reactions   Anesthetics, Amide Nausea Only   Pravastatin Sodium     REACTION: effects brain memory   Sulfadiazine     GI upset     Consultations: None  Procedures/Studies: DG Chest 2 View  Result Date: 12/26/2019 CLINICAL DATA:  Chest pain, nausea and shortness of breath. EXAM: CHEST - 2 VIEW COMPARISON:  Chest radiograph 12/13/2011 FINDINGS: Stable cardiomediastinal contours. The lungs are clear. No pneumothorax or pleural effusion. No acute finding in the visualized skeleton. Small rounded opacity projecting over the left lung apex favored to be artifactual, external to the patient. IMPRESSION: 1.  No acute cardiopulmonary process. 2. Small round opacity projecting over the left lung apex favored to be artifactual/external to the patient. Electronically Signed   By: Audie Pinto M.D.   On: 12/26/2019 16:49   CT CORONARY MORPH W/CTA COR W/SCORE W/CA W/CM &/OR WO/CM  Result Date: 12/27/2019 CLINICAL DATA:  64 year old female with h/o hypertension, hyperlipidemia and chest pain. EXAM: Cardiac/Coronary  CTA TECHNIQUE: The patient was scanned on a [employer]. FINDINGS: A 100 kV prospective scan was triggered in the descending thoracic aorta at 111 HU's. Axial non-contrast 3 mm slices were carried out through the heart. The data set was analyzed on a dedicated work station and scored using the [place]. Gantry rotation speed was 250 msecs and collimation was .6 mm. 50 mg of PO Metoprolol and 0.8 mg of sl NTG was given. The 3D data set was reconstructed in 5% intervals of the 67-82 % of the R-R cycle. Diastolic phases were analyzed on a dedicated work station using MPR, MIP and VRT modes. The patient received 80 cc of contrast. Aorta: Normal size. Mild diffuse atherosclerosis plaque and calcifications. No dissection. Aortic Valve:  Trileaflet.  No calcifications. Coronary Arteries:  Normal coronary origin.  Right dominance. RCA is a large dominant artery that gives rise to PDA and PLA. There is mild non-calcified plaque in the proximal and mid portion with stenosis 25-49%. Left main is a large artery that gives  rise to LAD and LCX arteries. Distal left main has minimal eccentric calcified plaque with stenosis 0-25%. LAD is a large vessel that gives rise to one diagonal artery. Proximal LAD has moderate to severe long dense calcified plaque with stenosis 50-69% but stenosis > 70% can't be excluded. Mid and distal LAD has only minimal plaque. D1 has only minimal plaque. LCX is a non-dominant artery that gives rise to one small lumen OM1 branch. There is minimal calcified plaque in the proximal LCX artery with stenosis 0-25%. Other findings: Normal pulmonary vein drainage into the left atrium. Normal left atrial appendage without a thrombus. Normal size of the pulmonary artery. IMPRESSION: 1. Coronary calcium score of 304. This was 6 percentile for age and sex matched control. 2. Normal coronary origin with right dominance. 3. CAD-RADS 3. Moderate stenosis in the proximal LAD with moderate to severe long dense calcified plaque with stenosis 50-69% but stenosis > 70% can't be excluded. Consider symptom-guided anti-ischemic pharmacotherapy as well as risk factor modification per guideline directed care. Additional analysis with CT FFR will be submitted. 4. PFO present. Electronically Signed   By:  Ena Dawley   On: 12/27/2019 13:06   ECHOCARDIOGRAM COMPLETE  Result Date: 12/27/2019    ECHOCARDIOGRAM REPORT   Patient Name:   Debbie Werner Date of Exam: 12/27/2019 Medical Rec #:  [id number]       Height:       61.0 in Accession #:    [id number]      Weight:       165.0 lb Date of Birth:  10-23-1955       BSA:          1.740 m Patient Age:    64 years        BP:           119/73 mmHg Patient Gender: F               HR:           68 bpm. Exam Location:  Inpatient Procedure: 2D Echo, Cardiac Doppler and Color Doppler Indications:    Chest Pain 786.50 / R07.9  History:        Patient has prior history of Echocardiogram examinations, most                 recent 01/05/2007. Signs/Symptoms:Chest Pain; Risk                  Factors:Hypertension, Dyslipidemia and Non-Smoker. GERD.  Sonographer:    Vickie Epley RDCS Referring Phys: [id number] Frannie  1. Left ventricular ejection fraction, by estimation, is 60 to 65%. The left ventricle has normal function. The left ventricle has no regional wall motion abnormalities. Left ventricular diastolic parameters are indeterminate.  2. Right ventricular systolic function is normal. The right ventricular size is normal. There is normal pulmonary artery systolic pressure.  3. The mitral valve is normal in structure. No evidence of mitral valve regurgitation. No evidence of mitral stenosis.  4. The aortic valve is tricuspid. Aortic valve regurgitation is not visualized. No aortic stenosis is present.  5. The inferior vena cava is normal in size with greater than 50% respiratory variability, suggesting right atrial pressure of 3 mmHg. FINDINGS  Left Ventricle: Left ventricular ejection fraction, by estimation, is 60 to 65%. The left ventricle has normal function. The left ventricle has no regional wall motion abnormalities. The left ventricular internal cavity size was normal in size. There is  no left ventricular hypertrophy. Left ventricular diastolic parameters are indeterminate. Right Ventricle: The right ventricular size is normal. No increase in right ventricular wall thickness. Right ventricular systolic function is normal. There is normal pulmonary artery systolic pressure. The tricuspid regurgitant velocity is 2.08 m/s, and  with an assumed right atrial pressure of 3 mmHg, the estimated right ventricular systolic pressure is 15.4 mmHg. Left Atrium: Left atrial size was normal in size. Right Atrium: Right atrial size was normal in size. Pericardium: There is no evidence of pericardial effusion. Mitral Valve: The mitral valve is normal in structure. No evidence of mitral valve regurgitation. No evidence of mitral valve stenosis. Tricuspid Valve: The tricuspid valve is normal in  structure. Tricuspid valve regurgitation is not demonstrated. No evidence of tricuspid stenosis. Aortic Valve: The aortic valve is tricuspid. Aortic valve regurgitation is not visualized. No aortic stenosis is present. Aortic valve mean gradient measures 5.6 mmHg. Aortic valve peak gradient measures 10.7 mmHg. Aortic valve area, by VTI measures 2.29  cm. Pulmonic Valve: The pulmonic valve was not well visualized. Pulmonic valve regurgitation is not visualized. No evidence of pulmonic  stenosis. Aorta: The aortic root is normal in size and structure. Pulmonary Artery: Indeterminant PASP, inadequate TR jet. Venous: The inferior vena cava is normal in size with greater than 50% respiratory variability, suggesting right atrial pressure of 3 mmHg. IAS/Shunts: No atrial level shunt detected by color flow Doppler.  LEFT VENTRICLE PLAX 2D LVIDd:         3.50 cm     Diastology LVIDs:         2.50 cm     LV e' medial:    5.87 cm/s LV PW:         0.90 cm     LV E/e' medial:  18.4 LV IVS:        0.90 cm     LV e' lateral:   6.31 cm/s LVOT diam:     1.90 cm     LV E/e' lateral: 17.1 LV SV:         84 LV SV Index:   48 LVOT Area:     2.84 cm  LV Volumes (MOD) LV vol d, MOD A2C: 56.2 ml LV vol d, MOD A4C: 60.0 ml LV vol s, MOD A2C: 21.6 ml LV vol s, MOD A4C: 18.1 ml LV SV MOD A2C:     34.6 ml LV SV MOD A4C:     60.0 ml LV SV MOD BP:      38.9 ml RIGHT VENTRICLE RV S prime:     11.30 cm/s TAPSE (M-mode): 2.0 cm LEFT ATRIUM             Index       RIGHT ATRIUM          Index LA diam:        2.70 cm 1.55 cm/m  RA Area:     8.54 cm LA Vol (A2C):   22.4 ml 12.87 ml/m RA Volume:   15.80 ml 9.08 ml/m LA Vol (A4C):   20.8 ml 11.95 ml/m LA Biplane Vol: 22.6 ml 12.98 ml/m  AORTIC VALVE AV Area (Vmax):    2.27 cm AV Area (Vmean):   2.17 cm AV Area (VTI):     2.29 cm AV Vmax:           163.50 cm/s AV Vmean:          111.185 cm/s AV VTI:            0.368 m AV Peak Grad:      10.7 mmHg AV Mean Grad:      5.6 mmHg LVOT Vmax:          131.00 cm/s LVOT Vmean:        84.900 cm/s LVOT VTI:          0.297 m LVOT/AV VTI ratio: 0.81  AORTA Ao Root diam: 2.80 cm MITRAL VALVE                TRICUSPID VALVE MV Area (PHT): 3.42 cm     TR Peak grad:   17.3 mmHg MV Decel Time: 222 msec     TR Vmax:        208.00 cm/s MV E velocity: 108.00 cm/s MV A velocity: 107.00 cm/s  SHUNTS MV E/A ratio:  1.01         Systemic VTI:  0.30 m                             Systemic Diam: 1.90 cm NIKE  MD Electronically signed by Carlyle Dolly MD Signature Date/Time: 12/27/2019/12:48:10 PM    Final      Subjective: No acute issues or events overnight denies chest pain, nausea, vomiting, diarrhea, constipation, headache, fevers, chills.   Discharge Exam: Vitals:   12/27/19 1119 12/27/19 1202  BP: (!) 141/82 108/83  Pulse:  62  Resp:  16  Temp:  98 F (36.7 C)  SpO2:  98%   Vitals:   12/27/19 0339 12/27/19 0744 12/27/19 1119 12/27/19 1202  BP: 119/73 108/69 (!) 141/82 108/83  Pulse: 66 72  62  Resp: 19 17  16   Temp: 97.6 F (36.4 C) 98.9 F (37.2 C)  98 F (36.7 C)  TempSrc: Oral Oral  Oral  SpO2: 100% 98%  98%  Weight:      Height:        General: Pt is alert, awake, not in acute distress Cardiovascular: RRR, S1/S2 +, no rubs, no gallops Respiratory: CTA bilaterally, no wheezing, no rhonchi Abdominal: Soft, NT, ND, bowel sounds + Extremities: no edema, no cyanosis    The results of significant diagnostics from this hospitalization (including imaging, microbiology, ancillary and laboratory) are listed below for reference.     Microbiology: Recent Results (from the past 240 hour(s))  Respiratory Panel by RT PCR (Flu A&B, Covid) - Nasopharyngeal Swab     Status: None   Collection Time: 12/26/19 11:53 PM   Specimen: Nasopharyngeal Swab  Result Value Ref Range Status   SARS Coronavirus 2 by RT PCR NEGATIVE NEGATIVE Final    Comment: (NOTE) SARS-CoV-2 target nucleic acids are NOT DETECTED.  The SARS-CoV-2 RNA is  generally detectable in upper respiratoy specimens during the acute phase of infection. The lowest concentration of SARS-CoV-2 viral copies this assay can detect is 131 copies/mL. A negative result does not preclude SARS-Cov-2 infection and should not be used as the sole basis for treatment or other patient management decisions. A negative result may occur with  improper specimen collection/handling, submission of specimen other than nasopharyngeal swab, presence of viral mutation(s) within the areas targeted by this assay, and inadequate number of viral copies (<131 copies/mL). A negative result must be combined with clinical observations, patient history, and epidemiological information. The expected result is Negative.  Fact Sheet for Patients:  PinkCheek.be  Fact Sheet for Healthcare Providers:  GravelBags.it  This test is no t yet approved or cleared by the [place] FDA and  has been authorized for detection and/or diagnosis of SARS-CoV-2 by FDA under an Emergency Use Authorization (EUA). This EUA will remain  in effect (meaning this test can be used) for the duration of the COVID-19 declaration under Section 564(b)(1) of the Act, 21 U.S.C. section 360bbb-3(b)(1), unless the authorization is terminated or revoked sooner.     Influenza A by PCR NEGATIVE NEGATIVE Final   Influenza B by PCR NEGATIVE NEGATIVE Final    Comment: (NOTE) The Xpert Xpress SARS-CoV-2/FLU/RSV assay is intended as an aid in  the diagnosis of influenza from Nasopharyngeal swab specimens and  should not be used as a sole basis for treatment. Nasal washings and  aspirates are unacceptable for Xpert Xpress SARS-CoV-2/FLU/RSV  testing.  Fact Sheet for Patients: PinkCheek.be  Fact Sheet for Healthcare Providers: GravelBags.it  This test is not yet approved or cleared by the [place] FDA and  has been authorized for detection and/or diagnosis of SARS-CoV-2 by  FDA under an Emergency Use Authorization (EUA). This EUA will remain  in effect (meaning this  test can be used) for the duration of the  Covid-19 declaration under Section 564(b)(1) of the Act, 21  U.S.C. section 360bbb-3(b)(1), unless the authorization is  terminated or revoked. Performed at [facility], [place] [address] [place], [place] [address]      Labs: BNP (last 3 results) No results for input(s): BNP in the last 8760 hours. Basic Metabolic Panel: Recent Labs  Lab 12/26/19 1558  NA 140  K 4.3  CL 107  CO2 25  GLUCOSE 103*  BUN 12  CREATININE 0.89  CALCIUM 9.2   Liver Function Tests: No results for input(s): AST, ALT, ALKPHOS, BILITOT, PROT, ALBUMIN in the last 168 hours. No results for input(s): LIPASE, AMYLASE in the last 168 hours. No results for input(s): AMMONIA in the last 168 hours. CBC: Recent Labs  Lab 12/26/19 1558  WBC 9.6  HGB 13.7  HCT 43.2  MCV 92.9  PLT 340   Cardiac Enzymes: No results for input(s): CKTOTAL, CKMB, CKMBINDEX, TROPONINI in the last 168 hours. BNP: Invalid input(s): POCBNP CBG: No results for input(s): GLUCAP in the last 168 hours. D-Dimer No results for input(s): DDIMER in the last 72 hours. Hgb A1c No results for input(s): HGBA1C in the last 72 hours. Lipid Profile Recent Labs    12/27/19 0605  CHOL 243*  HDL 34*  LDLCALC 178*  TRIG 154*  CHOLHDL 7.1   Thyroid function studies Recent Labs    12/27/19 0605  TSH 8.887*   Anemia work up No results for input(s): VITAMINB12, FOLATE, FERRITIN, TIBC, IRON, RETICCTPCT in the last 72 hours. Urinalysis    Component Value Date/Time   COLORURINE YELLOW 01/06/2007 [place] 01/06/2007 1245   LABSPEC 1.010 01/06/2007 1245   PHURINE 5.5 01/06/2007 1245   GLUCOSEU NEGATIVE 01/06/2007 1245   HGBUR NEGATIVE 01/06/2007 1245   BILIRUBINUR neg 10/29/2011 1213    KETONESUR NEGATIVE 01/06/2007 1245   PROTEINUR neg 10/29/2011 1213   PROTEINUR NEGATIVE 01/06/2007 1245   UROBILINOGEN 0.2 10/29/2011 1213   UROBILINOGEN 0.2 01/06/2007 1245   NITRITE neg 10/29/2011 1213   NITRITE NEGATIVE 01/06/2007 1245   LEUKOCYTESUR Negative 10/29/2011 1213   Sepsis Labs Invalid input(s): PROCALCITONIN,  WBC,  LACTICIDVEN Microbiology Recent Results (from the past 240 hour(s))  Respiratory Panel by RT PCR (Flu A&B, Covid) - Nasopharyngeal Swab     Status: None   Collection Time: 12/26/19 11:53 PM   Specimen: Nasopharyngeal Swab  Result Value Ref Range Status   SARS Coronavirus 2 by RT PCR NEGATIVE NEGATIVE Final    Comment: (NOTE) SARS-CoV-2 target nucleic acids are NOT DETECTED.  The SARS-CoV-2 RNA is generally detectable in upper respiratoy specimens during the acute phase of infection. The lowest concentration of SARS-CoV-2 viral copies this assay can detect is 131 copies/mL. A negative result does not preclude SARS-Cov-2 infection and should not be used as the sole basis for treatment or other patient management decisions. A negative result may occur with  improper specimen collection/handling, submission of specimen other than nasopharyngeal swab, presence of viral mutation(s) within the areas targeted by this assay, and inadequate number of viral copies (<131 copies/mL). A negative result must be combined with clinical observations, patient history, and epidemiological information. The expected result is Negative.  Fact Sheet for Patients:  PinkCheek.be  Fact Sheet for Healthcare Providers:  GravelBags.it  This test is no t yet approved or cleared by the [place] FDA and  has been authorized for detection and/or diagnosis of  SARS-CoV-2 by FDA under an Emergency Use Authorization (EUA). This EUA will remain  in effect (meaning this test can be used) for the duration of the COVID-19  declaration under Section 564(b)(1) of the Act, 21 U.S.C. section 360bbb-3(b)(1), unless the authorization is terminated or revoked sooner.     Influenza A by PCR NEGATIVE NEGATIVE Final   Influenza B by PCR NEGATIVE NEGATIVE Final    Comment: (NOTE) The Xpert Xpress SARS-CoV-2/FLU/RSV assay is intended as an aid in  the diagnosis of influenza from Nasopharyngeal swab specimens and  should not be used as a sole basis for treatment. Nasal washings and  aspirates are unacceptable for Xpert Xpress SARS-CoV-2/FLU/RSV  testing.  Fact Sheet for Patients: PinkCheek.be  Fact Sheet for Healthcare Providers: GravelBags.it  This test is not yet approved or cleared by the [place] FDA and  has been authorized for detection and/or diagnosis of SARS-CoV-2 by  FDA under an Emergency Use Authorization (EUA). This EUA will remain  in effect (meaning this test can be used) for the duration of the  Covid-19 declaration under Section 564(b)(1) of the Act, 21  U.S.C. section 360bbb-3(b)(1), unless the authorization is  terminated or revoked. Performed at [facility], [place] [address], [place], [place] [address]      Time coordinating discharge: Over 30 minutes  SIGNED:   Little Ishikawa, DO [facility] 12/27/2019, 1:21 PM Pager   If 7PM-7AM, please contact night-coverage [email or web address]

## 2019-12-27 NOTE — Progress Notes (Signed)
Discharge instructions given to patient. IV and Tele monitor discontinued. Waiting for husband to arrive around 5:30 pm.

## 2019-12-27 NOTE — Progress Notes (Signed)
  Echocardiogram 2D Echocardiogram has been performed.  Geoffery Lyons Swaim 12/27/2019, 11:48 AM

## 2019-12-27 NOTE — ED Notes (Addendum)
Attempted to call report to floor. Nurse will return call.

## 2019-12-28 DIAGNOSIS — R079 Chest pain, unspecified: Secondary | ICD-10-CM | POA: Diagnosis not present

## 2019-12-29 ENCOUNTER — Encounter: Payer: Self-pay | Admitting: Family Medicine

## 2019-12-29 ENCOUNTER — Other Ambulatory Visit: Payer: Self-pay

## 2019-12-29 ENCOUNTER — Ambulatory Visit (INDEPENDENT_AMBULATORY_CARE_PROVIDER_SITE_OTHER): Payer: PPO | Admitting: Family Medicine

## 2019-12-29 ENCOUNTER — Telehealth: Payer: Self-pay

## 2019-12-29 VITALS — BP 140/84 | HR 81 | Temp 98.0°F | Ht 61.0 in | Wt 165.5 lb

## 2019-12-29 DIAGNOSIS — R079 Chest pain, unspecified: Secondary | ICD-10-CM | POA: Diagnosis not present

## 2019-12-29 DIAGNOSIS — E785 Hyperlipidemia, unspecified: Secondary | ICD-10-CM | POA: Diagnosis not present

## 2019-12-29 DIAGNOSIS — I10 Essential (primary) hypertension: Secondary | ICD-10-CM

## 2019-12-29 MED ORDER — LIVALO 1 MG PO TABS: 1.0000 mg | ORAL_TABLET | Freq: Every day | ORAL | 2 refills | Status: DC

## 2019-12-29 NOTE — Progress Notes (Signed)
Established Patient Office Visit  Subjective:  Patient ID: Debbie Werner, female    DOB: 1955-04-19  Age: 64 y.o. MRN: [id number]  CC:  Chief Complaint  Patient presents with  . Hospitalization Follow-up    d/c on sunday from hospital high blood reading , pt when to the urgent care, still having weakness, tried     HPI Debbie Werner presents for hospital follow-up for recent chest pains.  She was admitted on the sixth and discharged on the seventh.  She relates that last Tuesday she had episode of what she thought was "indigestion".  That woke her up in the middle night with some pressure-like sensation middle of the chest which was nonradiating.  Lasted about 30 minutes and went away on its own.  She slept for the rest the night.  Then Saturday while she was at rest she had episode of heaviness and difficulty getting a deep breath.  She went to urgent care and had blood pressure 190/113.  She was given aspirin and nitroglycerin sublingually and sent to the ER for further evaluation.  EKG showed no acute findings.  Her labs were unremarkable with exception of high cholesterol.  Troponins were negative.  Cholesterol 243, triglycerides 154, HDL 34, LDL 178.  She was admitted with chest pain and hypertensive emergency.  She had CT coronary angiography and her calcium score came back over 300.  There was mention of moderate stenosis in the proximal LAD with moderate to severe long dense calcified plaque with stenosis 50 to 69% estimated.  There is also mention of PFO present.  Patient was discharged on aspirin and also addition of HCTZ 12.5 mg daily to her lisinopril 20 mg daily.  She was also advised to take Crestor 40 mg daily.  She has had intolerance with multiple statins previously including Lipitor, Zocor, pravastatin.  She took just 1 dose of Crestor 40 mg and states she had severe joint pains in the next morning and could barely move.  She has not taken any further doses since then.  Blood  pressure much improved.  In fact, she was concerned when she had blood pressure yesterday morning 105/60.  She held her HCTZ for 1 day.  She has not had any dizziness today.  She has never smoked.  Her father had CABG at age 72.  She has no history of diabetes.  EXAM: Cardiac/Coronary  CTA  TECHNIQUE: The patient was scanned on a [employer].  FINDINGS: A 100 kV prospective scan was triggered in the descending thoracic aorta at 111 HU's. Axial non-contrast 3 mm slices were carried out through the heart. The data set was analyzed on a dedicated work station and scored using the Quentin. Gantry rotation speed was 250 msecs and collimation was .6 mm. 50 mg of PO Metoprolol and 0.8 mg of sl NTG was given. The 3D data set was reconstructed in 5% intervals of the 67-82 % of the R-R cycle. Diastolic phases were analyzed on a dedicated work station using MPR, MIP and VRT modes. The patient received 80 cc of contrast.  Aorta: Normal size. Mild diffuse atherosclerosis plaque and calcifications. No dissection.  Aortic Valve:  Trileaflet.  No calcifications.  Coronary Arteries:  Normal coronary origin.  Right dominance.  RCA is a large dominant artery that gives rise to PDA and PLA. There is mild non-calcified plaque in the proximal and mid portion with stenosis 25-49%.  Left main is a large artery that gives rise  to LAD and LCX arteries. Distal left main has minimal eccentric calcified plaque with stenosis 0-25%.  LAD is a large vessel that gives rise to one diagonal artery. Proximal LAD has moderate to severe long dense calcified plaque with stenosis 50-69% but stenosis > 70% can't be excluded. Mid and distal LAD has only minimal plaque.  D1 has only minimal plaque.  LCX is a non-dominant artery that gives rise to one small lumen OM1 branch. There is minimal calcified plaque in the proximal LCX artery with stenosis 0-25%.  Other findings:  Normal  pulmonary vein drainage into the left atrium.  Normal left atrial appendage without a thrombus.  Normal size of the pulmonary artery.  IMPRESSION: 1. Coronary calcium score of 304. This was 50 percentile for age and sex matched control.  2. Normal coronary origin with right dominance.  3. CAD-RADS 3. Moderate stenosis in the proximal LAD with moderate to severe long dense calcified plaque with stenosis 50-69% but stenosis > 70% can't be excluded. Consider symptom-guided anti-ischemic pharmacotherapy as well as risk factor modification per guideline directed care. Additional analysis with CT FFR will be submitted.  4. PFO present.   Past Medical History:  Diagnosis Date  . ALLERGIC RHINITIS 08/03/2008  . Cancer ([place])    Skin - has had "spots" removed for years  . CHEST PAIN, ATYPICAL 10/12/2008  . CONSTIPATION, CHRONIC 12/08/2009  . DEPRESSION 12/08/2009  . GERD 08/03/2008  . Gestational diabetes   . HEMATOCHEZIA 05/02/2009  . HYPERLIPIDEMIA 08/03/2008  . HYPERTENSION 08/03/2008  . Obesity   . SINUSITIS, CHRONIC 02/24/2009    Past Surgical History:  Procedure Laterality Date  . BREAST SURGERY     bx  . BREATH TEK H PYLORI  12/21/2011   Procedure: BREATH TEK H PYLORI;  Surgeon: Pedro Earls, MD;  Location: [facility];  Service: General;  Laterality: N/A;  Katy/Andrea  . BUNIONECTOMY Left   . CYSTECTOMY  1996   ovary  . METATARSAL OSTEOTOMY Left   . NEURECTOMY FOOT Left   . TUBAL LIGATION      Family History  Problem Relation Age of Onset  . Heart disease Father 17       CABG  . Hyperlipidemia Neg Hx        parent, grandparent  . Hypertension Neg Hx        parent , grandparent  . Stroke Neg Hx        grandparent  . Diabetes Neg Hx        grandparent    Social History   Socioeconomic History  . Marital status: Married    Spouse name: Not on file  . Number of children: Not on file  . Years of education: Not on file  . Highest education level:  Not on file  Occupational History  . Not on file  Tobacco Use  . Smoking status: Never Smoker  . Smokeless tobacco: Never Used  Vaping Use  . Vaping Use: Never used  Substance and Sexual Activity  . Alcohol use: No    Alcohol/week: 0.0 standard drinks  . Drug use: No  . Sexual activity: Not on file  Other Topics Concern  . Not on file  Social History Narrative  . Not on file   Social Determinants of Health   Financial Resource Strain:   . Difficulty of Paying Living Expenses: Not on file  Food Insecurity:   . Worried About Charity fundraiser in the Last Year: Not on  file  . [place] in the Last Year: Not on file  Transportation Needs:   . Lack of Transportation (Medical): Not on file  . Lack of Transportation (Non-Medical): Not on file  Physical Activity:   . Days of Exercise per Week: Not on file  . Minutes of Exercise per Session: Not on file  Stress:   . Feeling of Stress : Not on file  Social Connections:   . Frequency of Communication with Friends and Family: Not on file  . Frequency of Social Gatherings with Friends and Family: Not on file  . Attends Religious Services: Not on file  . Active Member of Clubs or Organizations: Not on file  . Attends Archivist Meetings: Not on file  . Marital Status: Not on file  Intimate Partner Violence:   . Fear of Current or Ex-Partner: Not on file  . Emotionally Abused: Not on file  . Physically Abused: Not on file  . Sexually Abused: Not on file    Outpatient Medications Prior to Visit  Medication Sig Dispense Refill  . aspirin EC 81 MG EC tablet Take 1 tablet (81 mg total) by mouth daily. Swallow whole. 30 tablet 11  . hydrochlorothiazide (MICROZIDE) 12.5 MG capsule Take 1 capsule (12.5 mg total) by mouth daily. 30 capsule 0  . lisinopril (ZESTRIL) 20 MG tablet Take 1 tablet by mouth once daily (Patient taking differently: Take 20 mg by mouth daily. ) 90 tablet 0  . valACYclovir (VALTREX) 1000 MG tablet  Take 2 tablets by mouth at onset of cold sore and repeat 2 in 12 hours 30 tablet 1  . rosuvastatin (CRESTOR) 40 MG tablet Take 1 tablet (40 mg total) by mouth every other day. 15 tablet 0  . Diclofenac Sodium 3 % GEL Place 1 application onto the skin 4 (four) times daily as needed. (Patient not taking: Reported on 12/26/2019) 100 g 5   No facility-administered medications prior to visit.    Allergies  Allergen Reactions  . Anesthetics, Amide Nausea Only  . Pravastatin Sodium     REACTION: effects brain memory  . Sulfadiazine     GI upset    ROS Review of Systems  Constitutional: Negative for fatigue.  Eyes: Negative for visual disturbance.  Respiratory: Negative for cough, chest tightness, shortness of breath and wheezing.   Cardiovascular: Negative for chest pain, palpitations and leg swelling.  Gastrointestinal: Negative for abdominal pain.  Genitourinary: Negative for dysuria.  Neurological: Negative for dizziness, seizures, syncope, weakness, light-headedness and headaches.      Objective:    Physical Exam Vitals reviewed.  Constitutional:      Appearance: Normal appearance.  Cardiovascular:     Rate and Rhythm: Normal rate and regular rhythm.  Pulmonary:     Effort: Pulmonary effort is normal.     Breath sounds: Normal breath sounds.  Musculoskeletal:     Right lower leg: No edema.     Left lower leg: No edema.  Neurological:     Mental Status: She is alert.     BP 140/84 (BP Location: Left Arm, Patient Position: Sitting, Cuff Size: Normal)   Pulse 81   Temp 98 F (36.7 C)   Ht 5\' 1"  (1.549 m)   Wt 165 lb 8 oz (75.1 kg)   SpO2 99%   BMI 31.27 kg/m  Wt Readings from Last 3 Encounters:  12/29/19 165 lb 8 oz (75.1 kg)  12/26/19 165 lb (74.8 kg)  04/08/17 150 lb  12.8 oz (68.4 kg)     Health Maintenance Due  Topic Date Due  . Hepatitis C Screening  Never done  . HIV Screening  Never done  . COLONOSCOPY  Never done  . TETANUS/TDAP  07/23/2016  . PAP  SMEAR-Modifier  04/15/2017  . DEXA SCAN  Never done    There are no preventive care reminders to display for this patient.  Lab Results  Component Value Date   TSH 8.887 (H) 12/27/2019   Lab Results  Component Value Date   WBC 9.6 12/26/2019   HGB 13.7 12/26/2019   HCT 43.2 12/26/2019   MCV 92.9 12/26/2019   PLT 340 12/26/2019   Lab Results  Component Value Date   NA 140 12/26/2019   K 4.3 12/26/2019   CO2 25 12/26/2019   GLUCOSE 103 (H) 12/26/2019   BUN 12 12/26/2019   CREATININE 0.89 12/26/2019   BILITOT 0.6 02/06/2019   ALKPHOS 80 02/06/2019   AST 17 02/06/2019   ALT 21 02/06/2019   PROT 6.3 02/06/2019   ALBUMIN 4.1 02/06/2019   CALCIUM 9.2 12/26/2019   ANIONGAP 8 12/26/2019   GFR 59.16 (L) 10/02/2018   Lab Results  Component Value Date   CHOL 243 (H) 12/27/2019   Lab Results  Component Value Date   HDL 34 (L) 12/27/2019   Lab Results  Component Value Date   LDLCALC 178 (H) 12/27/2019   Lab Results  Component Value Date   TRIG 154 (H) 12/27/2019   Lab Results  Component Value Date   CHOLHDL 7.1 12/27/2019   Lab Results  Component Value Date   HGBA1C 5.5 02/06/2019      Assessment & Plan:   #1 recent episode of chest pain at rest.  Etiology unclear.  She has had no recent exertional chest pains.  Very high coronary calcium score which is 92nd percentile for age and gender.  Currently pain-free.  -Recommend establishing with cardiology  #2 hypertension with recent severe elevation and improved today with recent addition of HCTZ to her lisinopril.  #3 dyslipidemia.  Needs better lipid control.  Prior intolerance with multiple statins including Crestor, Lipitor, pravastatin, simvastatin  -Consider trial of low-dose Livalo low 1 mg once daily-and if tolerating consider further titration to 2 mg -She may be a candidate for PCSK 9 inhibitor if not tolerating the above  Meds ordered this encounter  Medications  . Pitavastatin Calcium (LIVALO) 1  MG TABS    Sig: Take 1 tablet (1 mg total) by mouth daily.    Dispense:  30 tablet    Refill:  2    Follow-up: Return in about 2 months (around 02/28/2020).    Carolann Littler, MD

## 2019-12-29 NOTE — Telephone Encounter (Signed)
Received authorization for visit for hospital follow up, # [id number], dx R07.9, admit date 12/26/2019-12/27/2019. From [employer] .

## 2019-12-29 NOTE — Patient Instructions (Signed)

## 2019-12-30 ENCOUNTER — Other Ambulatory Visit: Payer: Self-pay | Admitting: Family Medicine

## 2019-12-30 ENCOUNTER — Encounter: Payer: Self-pay | Admitting: Family Medicine

## 2019-12-30 DIAGNOSIS — R931 Abnormal findings on diagnostic imaging of heart and coronary circulation: Secondary | ICD-10-CM

## 2019-12-31 DIAGNOSIS — Z85828 Personal history of other malignant neoplasm of skin: Secondary | ICD-10-CM | POA: Diagnosis not present

## 2019-12-31 DIAGNOSIS — Z08 Encounter for follow-up examination after completed treatment for malignant neoplasm: Secondary | ICD-10-CM | POA: Diagnosis not present

## 2020-01-01 ENCOUNTER — Ambulatory Visit: Payer: PPO | Admitting: Family Medicine

## 2020-01-26 ENCOUNTER — Ambulatory Visit: Payer: PPO | Admitting: Cardiology

## 2020-01-26 ENCOUNTER — Encounter: Payer: Self-pay | Admitting: Cardiology

## 2020-01-26 ENCOUNTER — Other Ambulatory Visit: Payer: Self-pay

## 2020-01-26 VITALS — BP 128/72 | HR 73 | Ht 61.0 in | Wt 168.0 lb

## 2020-01-26 DIAGNOSIS — I10 Essential (primary) hypertension: Secondary | ICD-10-CM

## 2020-01-26 DIAGNOSIS — R0600 Dyspnea, unspecified: Secondary | ICD-10-CM

## 2020-01-26 DIAGNOSIS — E669 Obesity, unspecified: Secondary | ICD-10-CM

## 2020-01-26 DIAGNOSIS — I2510 Atherosclerotic heart disease of native coronary artery without angina pectoris: Secondary | ICD-10-CM | POA: Diagnosis not present

## 2020-01-26 DIAGNOSIS — I2583 Coronary atherosclerosis due to lipid rich plaque: Secondary | ICD-10-CM | POA: Diagnosis not present

## 2020-01-26 DIAGNOSIS — E7800 Pure hypercholesterolemia, unspecified: Secondary | ICD-10-CM | POA: Diagnosis not present

## 2020-01-26 DIAGNOSIS — R0609 Other forms of dyspnea: Secondary | ICD-10-CM

## 2020-01-26 NOTE — Progress Notes (Signed)
Cardiology Consult Note    Date:  01/26/2020   ID:  Debbie Werner, DOB 02-10-1956, MRN [id number]  PCP:  Eulas Post, MD  Cardiologist:  Fransico Him, MD   Chief Complaint  Patient presents with  . Coronary Artery Disease  . Hypertension  . Hyperlipidemia    History of Present Illness:  Debbie Werner is a 64 y.o. female who is being seen today for the evaluation of chest pain at the request of Eulas Post, MD.  This is a 64yo female with a hx of GERD, HLD, HTN and obesity.  She was recently seen in the ER last month with complaints of chest pain.  She had an episode of chest pain one evening that she thought was indigestion and awakened her in the middle of the night that she described as a pressure sensation in the mid sternal area that radiated into the shoulders and resolved after 30 minutes.    The next day she had an episode of chest heaviness while at rest and was SOB and went to Urgent care and BP was 190/174mmhg and was sent to the ER.  EKG was normal, LDL was elevated at 178 and trop neg.  She was admitted with [facility] urgency.  Coronary CTA showed a Ca score of 300 with moderate pLAD stenosis with moderate to severe long dense calcified plaque 50-60% and possible PFO.  She also had 25-49% prox to mid RCA, minimal LM and LCx disease.  Coronary FFR showed normal blood flow and medical management was recommended.  She was discharged on HCTZ and ACE I and started on Crestor (intolerant to Lipitor, Zocor, pravastatin).  Unfortunately she did not tolerate Crestor due to severe joint pain the next day.  She has a fm hx of CAD with her father having CABG at 46yo. She has never smoked.   She is here today for followup and is doing well.She has chronic DOE which is new for her but she contributes it to 30lb weight gain during COVID 19 and lack of exercise.  She has chronic LE edema.  She does have problems with GERD.  She denies any chest pain or pressure, PND, orthopnea,  lightheadedness, palpitations or syncope. She is compliant with her meds and is tolerating meds with no SE.    Past Medical History:  Diagnosis Date  . ALLERGIC RHINITIS 08/03/2008  . Cancer ([place])    Skin - has had "spots" removed for years  . CHEST PAIN, ATYPICAL 10/12/2008  . CONSTIPATION, CHRONIC 12/08/2009  . DEPRESSION 12/08/2009  . GERD 08/03/2008  . Gestational diabetes   . HEMATOCHEZIA 05/02/2009  . HYPERLIPIDEMIA 08/03/2008  . HYPERTENSION 08/03/2008  . Obesity   . SINUSITIS, CHRONIC 02/24/2009    Past Surgical History:  Procedure Laterality Date  . BREAST SURGERY     bx  . BREATH TEK H PYLORI  12/21/2011   Procedure: BREATH TEK H PYLORI;  Surgeon: Pedro Earls, MD;  Location: [facility];  Service: General;  Laterality: N/A;  Katy/Andrea  . BUNIONECTOMY Left   . CYSTECTOMY  1996   ovary  . METATARSAL OSTEOTOMY Left   . NEURECTOMY FOOT Left   . TUBAL LIGATION      Current Medications: Current Meds  Medication Sig  . aspirin EC 81 MG EC tablet Take 1 tablet (81 mg total) by mouth daily. Swallow whole.  . Diclofenac Sodium 3 % GEL Place 1 application onto the skin 4 (four) times daily as  needed.  . hydrochlorothiazide (MICROZIDE) 12.5 MG capsule Take 1 capsule (12.5 mg total) by mouth daily.  Marland Kitchen lisinopril (ZESTRIL) 20 MG tablet Take 1 tablet by mouth once daily (Patient taking differently: Take 20 mg by mouth daily. )  . Pitavastatin Calcium (LIVALO) 1 MG TABS Take 1 tablet (1 mg total) by mouth daily.  . valACYclovir (VALTREX) 1000 MG tablet TAKE 2 TABLETS BY MOUTH AT ONSET OF COLD SORE AND REPEAT IN 12 HOURS    Allergies:   Anesthetics, amide; Pravastatin sodium; and Sulfadiazine   Social History   Socioeconomic History  . Marital status: Married    Spouse name: Not on file  . Number of children: Not on file  . Years of education: Not on file  . Highest education level: Not on file  Occupational History  . Not on file  Tobacco Use  . Smoking status: Never  Smoker  . Smokeless tobacco: Never Used  Vaping Use  . Vaping Use: Never used  Substance and Sexual Activity  . Alcohol use: No    Alcohol/week: 0.0 standard drinks  . Drug use: No  . Sexual activity: Not on file  Other Topics Concern  . Not on file  Social History Narrative  . Not on file   Social Determinants of Health   Financial Resource Strain:   . Difficulty of Paying Living Expenses: Not on file  Food Insecurity:   . Worried About Charity fundraiser in the Last Year: Not on file  . Ran Out of Food in the Last Year: Not on file  Transportation Needs:   . Lack of Transportation (Medical): Not on file  . Lack of Transportation (Non-Medical): Not on file  Physical Activity:   . Days of Exercise per Week: Not on file  . Minutes of Exercise per Session: Not on file  Stress:   . Feeling of Stress : Not on file  Social Connections:   . Frequency of Communication with Friends and Family: Not on file  . Frequency of Social Gatherings with Friends and Family: Not on file  . Attends Religious Services: Not on file  . Active Member of Clubs or Organizations: Not on file  . Attends Archivist Meetings: Not on file  . Marital Status: Not on file     Family History:  The patient's family history includes Heart disease (age of onset: 36) in her father.   ROS:   Please see the history of present illness.    ROS All other systems reviewed and are negative.  No flowsheet data found.     PHYSICAL EXAM:   VS:  BP 128/72   Pulse 73   Ht 5\' 1"  (1.549 m)   Wt 168 lb (76.2 kg)   SpO2 97%   BMI 31.74 kg/m    GEN: Well nourished, well developed, in no acute distress  HEENT: normal  Neck: no JVD, carotid bruits, or masses Cardiac: RRR; no murmurs, rubs, or gallops,no edema.  Intact distal pulses bilaterally.  Respiratory:  clear to auscultation bilaterally, normal work of breathing GI: soft, nontender, nondistended, + BS MS: no deformity or atrophy  Skin: warm and  dry, no rash Neuro:  Alert and Oriented x 3, Strength and sensation are intact Psych: euthymic mood, full affect  Wt Readings from Last 3 Encounters:  01/26/20 168 lb (76.2 kg)  12/29/19 165 lb 8 oz (75.1 kg)  12/26/19 165 lb (74.8 kg)      Studies/Labs Reviewed:  EKG:  EKG is not ordered today.    Recent Labs: 02/06/2019: ALT 21 12/26/2019: BUN 12; Creatinine, Ser 0.89; Hemoglobin 13.7; Platelets 340; Potassium 4.3; Sodium 140 12/27/2019: TSH 8.887   Lipid Panel    Component Value Date/Time   CHOL 243 (H) 12/27/2019 0605   TRIG 154 (H) 12/27/2019 0605   HDL 34 (L) 12/27/2019 0605   CHOLHDL 7.1 12/27/2019 0605   VLDL 31 12/27/2019 0605   LDLCALC 178 (H) 12/27/2019 0605   LDLDIRECT 203.7 03/19/2013 0947     Additional studies/ records that were reviewed today include:  Coronary CTA 12/2019 Aorta: Normal size. Mild diffuse atherosclerosis plaque and calcifications. No dissection.  Aortic Valve:  Trileaflet.  No calcifications.  Coronary Arteries:  Normal coronary origin.  Right dominance.  RCA is a large dominant artery that gives rise to PDA and PLA. There is mild non-calcified plaque in the proximal and mid portion with stenosis 25-49%.  Left main is a large artery that gives rise to LAD and LCX arteries. Distal left main has minimal eccentric calcified plaque with stenosis 0-25%.  LAD is a large vessel that gives rise to one diagonal artery. Proximal LAD has moderate to severe long dense calcified plaque with stenosis 50-69% but stenosis > 70% can't be excluded. Mid and distal LAD has only minimal plaque.  D1 has only minimal plaque.  LCX is a non-dominant artery that gives rise to one small lumen OM1 branch. There is minimal calcified plaque in the proximal LCX artery with stenosis 0-25%.  Other findings:  Normal pulmonary vein drainage into the left atrium.  Normal left atrial appendage without a thrombus.  Normal size of the pulmonary  artery.  IMPRESSION: 1. Coronary calcium score of 304. This was 26 percentile for age and sex matched control.  2. Normal coronary origin with right dominance.  3. CAD-RADS 3. Moderate stenosis in the proximal LAD with moderate to severe long dense calcified plaque with stenosis 50-69% but stenosis > 70% can't be excluded. Consider symptom-guided anti-ischemic pharmacotherapy as well as risk factor modification per guideline directed care. Additional analysis with CT FFR will be submitted.  4. PFO present.  ASSESSMENT:    1. Coronary artery disease due to lipid rich plaque   2. Essential hypertension   3. Pure hypercholesterolemia   4. DOE (dyspnea on exertion)   5. Obesity (BMI 30-39.9)      PLAN:  In order of problems listed above:  1.  ASCAD -Coronary CTA showed a Ca score of 300 with moderate pLAD stenosis with moderate to severe long dense calcified plaque 50-60% and possible PFO.  She also had 25-49% prox to mid RCA, minimal LM and LCx disease.  -Coronary FFR showed normal blood flow and medical management was recommended.  -she has not had any further anginal sx -continue ASA, statin -HbA1C was normal at 5.5% in 01/2019  2.  HTN -BP controlled on exam today -continue Lisinopril 20mg  daily and HCTZ 12.5mg  daily  3.  HLD -LDL goal < 70 -LDL 178 -started on Crestor but intolerant due to severe muscle aches after 1 dose>>also intolerant to pravachol, lipitor and simvastatin. -she tried livalo and had to stop after 3 days and then went back to trying it at night -I do not think we will get her to goal on this so I am referring her to lipid clinic for PCSK9i  4.  DOE -likely multifactorial due to sedentary state and obesity -2D echo normal  5. Obesity -I have  encouraged her to get into a routine exercise program and cut back on carbs and portions.  -we did talk about the healthy [facility] and wellness [facility] at [facility] but she wants to think about it  Medication  Adjustments/Labs and Tests Ordered: Current medicines are reviewed at length with the patient today.  Concerns regarding medicines are outlined above.  Medication changes, Labs and Tests ordered today are listed in the Patient Instructions below.  There are no Patient Instructions on file for this visit.   Signed, Fransico Him, MD  01/26/2020 10:20 AM    [facility] [address], [place], [place]  [address] Phone: [phone]; Fax: [phone]

## 2020-01-26 NOTE — Addendum Note (Signed)
Addended by: Antonieta Iba on: 01/26/2020 10:22 AM   Modules accepted: Orders

## 2020-01-26 NOTE — Patient Instructions (Signed)
Medication Instructions:  Your physician recommends that you continue on your current medications as directed. Please refer to the Current Medication list given to you today.  *If you need a refill on your cardiac medications before your next appointment, please call your pharmacy*  Follow-Up: At [facility], you and your health needs are our priority.  As part of our continuing mission to provide you with exceptional heart care, we have created designated Provider Care Teams.  These Care Teams include your primary Cardiologist (physician) and Advanced Practice Providers (APPs -  Physician Assistants and Nurse Practitioners) who all work together to provide you with the care you need, when you need it.  Your next appointment:   1 year(s)  The format for your next appointment:   In Person  Provider:   You may see Traci Turner, MD or one of the following Advanced Practice Providers on your designated Care Team:   Dayna Dunn, PA-C Michele Lenze, PA-C   Other Instructions You have been referred to see our PharmD in the Lipid Clinic 

## 2020-02-18 ENCOUNTER — Other Ambulatory Visit: Payer: Self-pay

## 2020-02-18 ENCOUNTER — Ambulatory Visit (INDEPENDENT_AMBULATORY_CARE_PROVIDER_SITE_OTHER): Payer: PPO | Admitting: Pharmacist

## 2020-02-18 DIAGNOSIS — I25119 Atherosclerotic heart disease of native coronary artery with unspecified angina pectoris: Secondary | ICD-10-CM

## 2020-02-18 DIAGNOSIS — G720 Drug-induced myopathy: Secondary | ICD-10-CM

## 2020-02-18 DIAGNOSIS — T466X5A Adverse effect of antihyperlipidemic and antiarteriosclerotic drugs, initial encounter: Secondary | ICD-10-CM

## 2020-02-18 DIAGNOSIS — E7800 Pure hypercholesterolemia, unspecified: Secondary | ICD-10-CM | POA: Diagnosis not present

## 2020-02-18 MED ORDER — REPATHA SURECLICK 140 MG/ML ~~LOC~~ SOAJ: 1.0000 "pen " | SUBCUTANEOUS | 11 refills | Status: DC

## 2020-02-18 MED ORDER — REPATHA SURECLICK 140 MG/ML ~~LOC~~ SOAJ
1.0000 | SUBCUTANEOUS | 3 refills | Status: DC
Start: 2020-02-18 — End: 2020-03-22

## 2020-02-18 NOTE — Patient Instructions (Addendum)
It was nice to meet you today!  Your LDL is currently 178 and your goal is < 70  Start taking Repatha 1 injection every 14 days into the fatty tissue of your abdomen or upper outer thigh. This will lower your LDL by 60%. Store the medication in the fridge until you're ready to use a dose. You can let a dose sit out at room temperature for 30 minutes to warm up.   Starting February 1st, Repatha will be a Tier 3 on your insurance plan (~$90/3 month supply)  Recheck fasting labs on Thursday, February 24th any time after 7:30am  Call Aundra Millet, Pharmacist with any concerns #[phone]

## 2020-02-18 NOTE — Progress Notes (Signed)
Patient ID: Debbie Werner                 DOB: 09/20/1955                    MRN: [id number]     HPI: Debbie Werner is a 64 y.o. female patient referred to lipid clinic by Dr Mayford Knife. PMH is significant for HTN, HLD, obesity, GERD, and chest pain. She went to urgent care due to chest heaviness and SOB, was admitted for hypertensive emergency with BP of 190/113. EKG was normal, troponins were negative, LDL was elevated at 178. Coronary CTA showed calcium score of 304 (92nd percentile for age and sex matched) with moderate pLAD stenosis with moderate to severe long dense calcified plaque 50-60% and possible PFO, also had 25-49% prox to mid RCA stenosis, minimal LM and LCx disease. Coronary FFR showed normal blood flow and medical management was recommended. She was discharged on HCTZ, ACEi, and Crestor (intolerant to Lipitor, Zocor, Pravachol, and Livalo). She stopped Crestor the next day due to joint pain and was referred to lipid clinic to discuss PCSK9i therapy.  Pt presents today in good spirits with her husband. Reports trying 5 statins in the past, all of which caused muscle pain in her legs and diffuse joint pain within a day or two of starting therapy. Symptoms would resolve each time she discontinued her statin. No longer taking Livalo, this has been removed from her med list. At last visit with Dr Mayford Knife, pt reported chronic DOE that was new to her, attributed to 30 lb weight gain during COVID and lack of exercise. Plans to increase her activity and work on heart healthy died.  Current Medications: none  Intolerances: pravastatin, atorvastatin, simvastatin, rosuvastatin 10mg  every other day, Livalo 1mg  daily - muscle aches  Risk Factors: CAD, fam hx of premature CAD, angina  LDL goal: 70mg /dL  Diet: Avoids red meat, drinks diet soda.   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  Family History: Father had CABG at age 65,  maternal grandmother with heart disease as well  Social History: Denies tobacco, alcohol, and illicit drug use.  Labs: 12/27/19: TC 243, TG 154, HDL 34, LDL 178 (no lipid lowering therapy)  Past Medical History:  Diagnosis Date  . ALLERGIC RHINITIS 08/03/2008  . Cancer (HCC)    Skin - has had "spots" removed for years  . CHEST PAIN, ATYPICAL 10/12/2008  . CONSTIPATION, CHRONIC 12/08/2009  . DEPRESSION 12/08/2009  . GERD 08/03/2008  . Gestational diabetes   . HEMATOCHEZIA 05/02/2009  . HYPERLIPIDEMIA 08/03/2008  . HYPERTENSION 08/03/2008  . Obesity   . SINUSITIS, CHRONIC 02/24/2009    Current Outpatient Medications on File Prior to Visit  Medication Sig Dispense Refill  . aspirin EC 81 MG EC tablet Take 1 tablet (81 mg total) by mouth daily. Swallow whole. 30 tablet 11  . Diclofenac Sodium 3 % GEL Place 1 application onto the skin 4 (four) times daily as needed. 100 g 5  . hydrochlorothiazide (MICROZIDE) 12.5 MG capsule Take 1 capsule (12.5 mg total) by mouth daily. 30 capsule 0  . lisinopril (ZESTRIL) 20 MG tablet Take 1 tablet by mouth once daily (Patient taking differently: Take 20 mg by mouth daily. ) 90 tablet 0  . Pitavastatin Calcium (LIVALO) 1 MG TABS Take 1 tablet (1 mg total) by mouth daily. 30 tablet 2  .  valACYclovir (VALTREX) 1000 MG tablet TAKE 2 TABLETS BY MOUTH AT ONSET OF COLD SORE AND REPEAT IN 12 HOURS 30 tablet 0   No current facility-administered medications on file prior to visit.    Allergies  Allergen Reactions  . Anesthetics, Amide Nausea Only  . Pravastatin Sodium     REACTION: effects brain memory  . Sulfadiazine     GI upset    Assessment/Plan:  1. Hyperlipidemia - Baseline LDL 178 above goal < 70 due to hx of CAD. Pt is intolerant to 5 statins and Zetia monotherapy will not bring LDL to goal. Discussed PCSK9i therapy including expected benefits, side effects, and injection technique. Pt is agreeable to start Repatha 140mg  Q2W. Prior authorization has  been submitted and approved. Rx sent to pharmacy - of note, current price is $90/1 month as a Tier 4 medication which pt is ok with. Discussed that starting March 22, 2020, Repatha will move to a Tier 3 and a 3 month supply will cost $90. Follow up labs scheduled in 2 months to assess efficacy. Pt aware to call with any concerns.  [name] E. Jacoby Ritsema, PharmD, BCACP, [place] [id number], [place], [place] [address] Phone: [phone]; Fax: [phone] 02/18/2020 10:01 AM

## 2020-03-09 DIAGNOSIS — C44311 Basal cell carcinoma of skin of nose: Secondary | ICD-10-CM | POA: Diagnosis not present

## 2020-03-22 MED ORDER — REPATHA SURECLICK 140 MG/ML ~~LOC~~ SOAJ
1.0000 | SUBCUTANEOUS | 3 refills | Status: DC
Start: 2020-03-22 — End: 2021-03-14

## 2020-03-22 NOTE — Addendum Note (Signed)
Addended by: Georjean Toya E on: 03/22/2020 09:11 AM   Modules accepted: Orders

## 2020-03-29 ENCOUNTER — Other Ambulatory Visit: Payer: Self-pay | Admitting: Family Medicine

## 2020-03-31 NOTE — Addendum Note (Signed)
Addended by: Dantonio Justen E on: 03/31/2020 12:05 PM   Modules accepted: Orders

## 2020-04-12 ENCOUNTER — Telehealth: Payer: Self-pay | Admitting: Family Medicine

## 2020-04-12 NOTE — Telephone Encounter (Signed)
Spoke with patient she will call back to schedule [facility] appointment with dr Elease Hashimoto before 05/20/20

## 2020-04-14 ENCOUNTER — Other Ambulatory Visit: Payer: PPO

## 2020-04-21 ENCOUNTER — Other Ambulatory Visit: Payer: Self-pay

## 2020-04-21 ENCOUNTER — Other Ambulatory Visit: Payer: PPO | Admitting: *Deleted

## 2020-04-21 DIAGNOSIS — E7800 Pure hypercholesterolemia, unspecified: Secondary | ICD-10-CM | POA: Diagnosis not present

## 2020-04-21 LAB — HEPATIC FUNCTION PANEL
ALT: 21 IU/L (ref 0–32)
AST: 15 IU/L (ref 0–40)
Albumin: 4 g/dL (ref 3.8–4.8)
Alkaline Phosphatase: 105 IU/L (ref 44–121)
Bilirubin Total: 0.5 mg/dL (ref 0.0–1.2)
Bilirubin, Direct: 0.15 mg/dL (ref 0.00–0.40)
Total Protein: 6.3 g/dL (ref 6.0–8.5)

## 2020-04-21 LAB — LIPID PANEL
Chol/HDL Ratio: 3.2 ratio (ref 0.0–4.4)
Cholesterol, Total: 126 mg/dL (ref 100–199)
HDL: 40 mg/dL (ref >39)
LDL Chol Calc (NIH): 63 mg/dL (ref 0–99)
Triglycerides: 126 mg/dL (ref 0–149)
VLDL Cholesterol Cal: 23 mg/dL (ref 5–40)

## 2020-06-23 ENCOUNTER — Ambulatory Visit (INDEPENDENT_AMBULATORY_CARE_PROVIDER_SITE_OTHER): Payer: PPO

## 2020-06-23 DIAGNOSIS — Z1211 Encounter for screening for malignant neoplasm of colon: Secondary | ICD-10-CM | POA: Diagnosis not present

## 2020-06-23 DIAGNOSIS — Z1231 Encounter for screening mammogram for malignant neoplasm of breast: Secondary | ICD-10-CM | POA: Diagnosis not present

## 2020-06-23 DIAGNOSIS — Z780 Asymptomatic menopausal state: Secondary | ICD-10-CM | POA: Diagnosis not present

## 2020-06-23 DIAGNOSIS — Z0000 Encounter for general adult medical examination without abnormal findings: Secondary | ICD-10-CM

## 2020-06-23 NOTE — Patient Instructions (Signed)
Debbie Werner , Thank you for taking time to come for your Medicare Wellness Visit. I appreciate your ongoing commitment to your health goals. Please review the following plan we discussed and let me know if I can assist you in the future.   Screening recommendations/referrals: Colonoscopy: order placed 06/23/2020 Mammogram: order placed 06/23/2020 Bone Density: order placed 05/025/2022 Recommended yearly ophthalmology/optometry visit for glaucoma screening and checkup Recommended yearly dental visit for hygiene and checkup  Vaccinations: Influenza vaccine: due fall 2022 Pneumococcal vaccine: declined  Tdap vaccine: due upon injury  Shingles vaccine: declined   Advanced directives: will proved copies   Conditions/risks identified: none   Next appointment: none    Preventive Care 66 Years and Older, Female Preventive care refers to lifestyle choices and visits with your health care provider that can promote health and wellness. What does preventive care include?  A yearly physical exam. This is also called an annual well check.  Dental exams once or twice a year.  Routine eye exams. Ask your health care provider how often you should have your eyes checked.  Personal lifestyle choices, including:  Daily care of your teeth and gums.  Regular physical activity.  Eating a healthy diet.  Avoiding tobacco and drug use.  Limiting alcohol use.  Practicing safe sex.  Taking low-dose aspirin every day.  Taking vitamin and mineral supplements as recommended by your health care provider. What happens during an annual well check? The services and screenings done by your health care provider during your annual well check will depend on your age, overall health, lifestyle risk factors, and family history of disease. Counseling  Your health care provider may ask you questions about your:  Alcohol use.  Tobacco use.  Drug use.  Emotional well-being.  Home and relationship  well-being.  Sexual activity.  Eating habits.  History of falls.  Memory and ability to understand (cognition).  Work and work Statistician.  Reproductive health. Screening  You may have the following tests or measurements:  Height, weight, and BMI.  Blood pressure.  Lipid and cholesterol levels. These may be checked every 5 years, or more frequently if you are over 66 years old.  Skin check.  Lung cancer screening. You may have this screening every year starting at age 66 if you have a 30-pack-year history of smoking and currently smoke or have quit within the past 15 years.  Fecal occult blood test (FOBT) of the stool. You may have this test every year starting at age 66.  Flexible sigmoidoscopy or colonoscopy. You may have a sigmoidoscopy every 5 years or a colonoscopy every 10 years starting at age 66.  Hepatitis C blood test.  Hepatitis B blood test.  Sexually transmitted disease (STD) testing.  Diabetes screening. This is done by checking your blood sugar (glucose) after you have not eaten for a while (fasting). You may have this done every 1-3 years.  Bone density scan. This is done to screen for osteoporosis. You may have this done starting at age 66.  Mammogram. This may be done every 1-2 years. Talk to your health care provider about how often you should have regular mammograms. Talk with your health care provider about your test results, treatment options, and if necessary, the need for more tests. Vaccines  Your health care provider may recommend certain vaccines, such as:  Influenza vaccine. This is recommended every year.  Tetanus, diphtheria, and acellular pertussis (Tdap, Td) vaccine. You may need a Td booster every 10 years.  Zoster vaccine. You may need this after age 66.  Pneumococcal 13-valent conjugate (PCV13) vaccine. One dose is recommended after age 66.  Pneumococcal polysaccharide (PPSV23) vaccine. One dose is recommended after age  66. Talk to your health care provider about which screenings and vaccines you need and how often you need them. This information is not intended to replace advice given to you by your health care provider. Make sure you discuss any questions you have with your health care provider. Document Released: 03/04/2015 Document Revised: 10/26/2015 Document Reviewed: 12/07/2014 Elsevier Interactive Patient Education  2017 [employer] Prevention in the Home Falls can cause injuries. They can happen to people of all ages. There are many things you can do to make your home safe and to help prevent falls. What can I do on the outside of my home?  Regularly fix the edges of walkways and driveways and fix any cracks.  Remove anything that might make you trip as you walk through a door, such as a raised step or threshold.  Trim any bushes or trees on the path to your home.  Use bright outdoor lighting.  Clear any walking paths of anything that might make someone trip, such as rocks or tools.  Regularly check to see if handrails are loose or broken. Make sure that both sides of any steps have handrails.  Any raised decks and porches should have guardrails on the edges.  Have any leaves, snow, or ice cleared regularly.  Use sand or salt on walking paths during winter.  Clean up any spills in your garage right away. This includes oil or grease spills. What can I do in the bathroom?  Use night lights.  Install grab bars by the toilet and in the tub and shower. Do not use towel bars as grab bars.  Use non-skid mats or decals in the tub or shower.  If you need to sit down in the shower, use a plastic, non-slip stool.  Keep the floor dry. Clean up any water that spills on the floor as soon as it happens.  Remove soap buildup in the tub or shower regularly.  Attach bath mats securely with double-sided non-slip rug tape.  Do not have throw rugs and other things on the floor that can make  you trip. What can I do in the bedroom?  Use night lights.  Make sure that you have a light by your bed that is easy to reach.  Do not use any sheets or blankets that are too big for your bed. They should not hang down onto the floor.  Have a firm chair that has side arms. You can use this for support while you get dressed.  Do not have throw rugs and other things on the floor that can make you trip. What can I do in the kitchen?  Clean up any spills right away.  Avoid walking on wet floors.  Keep items that you use a lot in easy-to-reach places.  If you need to reach something above you, use a strong step stool that has a grab bar.  Keep electrical cords out of the way.  Do not use floor polish or wax that makes floors slippery. If you must use wax, use non-skid floor wax.  Do not have throw rugs and other things on the floor that can make you trip. What can I do with my stairs?  Do not leave any items on the stairs.  Make sure that there are  handrails on both sides of the stairs and use them. Fix handrails that are broken or loose. Make sure that handrails are as long as the stairways.  Check any carpeting to make sure that it is firmly attached to the stairs. Fix any carpet that is loose or worn.  Avoid having throw rugs at the top or bottom of the stairs. If you do have throw rugs, attach them to the floor with carpet tape.  Make sure that you have a light switch at the top of the stairs and the bottom of the stairs. If you do not have them, ask someone to add them for you. What else can I do to help prevent falls?  Wear shoes that:  Do not have high heels.  Have rubber bottoms.  Are comfortable and fit you well.  Are closed at the toe. Do not wear sandals.  If you use a stepladder:  Make sure that it is fully opened. Do not climb a closed stepladder.  Make sure that both sides of the stepladder are locked into place.  Ask someone to hold it for you, if  possible.  Clearly mark and make sure that you can see:  Any grab bars or handrails.  First and last steps.  Where the edge of each step is.  Use tools that help you move around (mobility aids) if they are needed. These include:  Canes.  Walkers.  Scooters.  Crutches.  Turn on the lights when you go into a dark area. Replace any light bulbs as soon as they burn out.  Set up your furniture so you have a clear path. Avoid moving your furniture around.  If any of your floors are uneven, fix them.  If there are any pets around you, be aware of where they are.  Review your medicines with your doctor. Some medicines can make you feel dizzy. This can increase your chance of falling. Ask your doctor what other things that you can do to help prevent falls. This information is not intended to replace advice given to you by your health care provider. Make sure you discuss any questions you have with your health care provider. Document Released: 12/02/2008 Document Revised: 07/14/2015 Document Reviewed: 03/12/2014 Elsevier Interactive Patient Education  2017 [employer].

## 2020-06-23 NOTE — Progress Notes (Signed)
Subjective:   Debbie Werner is a 65 y.o. female who presents for an Initial Medicare Annual Wellness Visit.  I connected with Charleston Vierling today by telephone and pt has no access to do a video call verified that I am speaking with the correct person using two identifiers. Location patient: home Location provider: work Persons participating in the virtual visit: patient, provider.   I discussed the limitations, risks, security and privacy concerns of performing an evaluation and management service by telephone and the availability of in person appointments. I also discussed with the patient that there may be a patient responsible charge related to this service. The patient expressed understanding and verbally consented to this telephonic visit.    Interactive audio and video telecommunications were attempted between this provider and patient, however failed, due to patient having technical difficulties OR patient did not have access to video capability.  We continued and completed visit with audio only.     Review of Systems    N/a        Objective:    Today's Vitals   There is no height or weight on file to calculate BMI.  Advanced Directives 06/23/2020 12/27/2019  Does Patient Have a Medical Advance Directive? Yes Yes  Type of Paramedic of [place];Living will Living will;Healthcare Power of Attorney  Does patient want to make changes to medical advance directive? No - Patient declined No - Patient declined  Copy of [place] in Chart? No - copy requested No - copy requested    Current Medications (verified) Outpatient Encounter Medications as of 06/23/2020  Medication Sig  . aspirin EC 81 MG EC tablet Take 1 tablet (81 mg total) by mouth daily. Swallow whole.  . Diclofenac Sodium 3 % GEL Place 1 application onto the skin 4 (four) times daily as needed.  . Evolocumab (REPATHA SURECLICK) 440 MG/ML SOAJ Inject 1 pen into the skin  every 14 (fourteen) days.  . hydrochlorothiazide (MICROZIDE) 12.5 MG capsule Take 1 capsule (12.5 mg total) by mouth daily.  Marland Kitchen lisinopril (ZESTRIL) 20 MG tablet Take 1 tablet by mouth once daily  . valACYclovir (VALTREX) 1000 MG tablet TAKE 2 TABLETS BY MOUTH AT ONSET OF COLD SORE AND REPEAT IN 12 HOURS   No facility-administered encounter medications on file as of 06/23/2020.    Allergies (verified) Anesthetics, amide; Pravastatin sodium; and Sulfadiazine   History: Past Medical History:  Diagnosis Date  . ALLERGIC RHINITIS 08/03/2008  . Cancer ([place])    Skin - has had "spots" removed for years  . CHEST PAIN, ATYPICAL 10/12/2008  . CONSTIPATION, CHRONIC 12/08/2009  . DEPRESSION 12/08/2009  . GERD 08/03/2008  . Gestational diabetes   . HEMATOCHEZIA 05/02/2009  . HYPERLIPIDEMIA 08/03/2008  . HYPERTENSION 08/03/2008  . Obesity   . SINUSITIS, CHRONIC 02/24/2009   Past Surgical History:  Procedure Laterality Date  . BREAST SURGERY     bx  . BREATH TEK H PYLORI  12/21/2011   Procedure: BREATH TEK H PYLORI;  Surgeon: Pedro Earls, MD;  Location: [facility];  Service: General;  Laterality: N/A;  Katy/Andrea  . BUNIONECTOMY Left   . CYSTECTOMY  1996   ovary  . METATARSAL OSTEOTOMY Left   . NEURECTOMY FOOT Left   . TUBAL LIGATION     Family History  Problem Relation Age of Onset  . Heart disease Father 63       CABG  . Hyperlipidemia Neg Hx  parent, grandparent  . Hypertension Neg Hx        parent , grandparent  . Stroke Neg Hx        grandparent  . Diabetes Neg Hx        grandparent   Social History   Socioeconomic History  . Marital status: Married    Spouse name: Not on file  . Number of children: Not on file  . Years of education: Not on file  . Highest education level: Not on file  Occupational History  . Not on file  Tobacco Use  . Smoking status: Never Smoker  . Smokeless tobacco: Never Used  Vaping Use  . Vaping Use: Never used  Substance and Sexual  Activity  . Alcohol use: No    Alcohol/week: 0.0 standard drinks  . Drug use: No  . Sexual activity: Not on file  Other Topics Concern  . Not on file  Social History Narrative  . Not on file   Social Determinants of Health   Financial Resource Strain: Low Risk   . Difficulty of Paying Living Expenses: Not hard at all  Food Insecurity: No Food Insecurity  . Worried About Charity fundraiser in the Last Year: Never true  . Ran Out of Food in the Last Year: Never true  Transportation Needs: No Transportation Needs  . Lack of Transportation (Medical): No  . Lack of Transportation (Non-Medical): No  Physical Activity: Insufficiently Active  . Days of Exercise per Week: 3 days  . Minutes of Exercise per Session: 30 min  Stress: No Stress Concern Present  . Feeling of Stress : Not at all  Social Connections: Moderately Integrated  . Frequency of Communication with Friends and Family: More than three times a week  . Frequency of Social Gatherings with Friends and Family: More than three times a week  . Attends Religious Services: 1 to 4 times per year  . Active Member of Clubs or Organizations: No  . Attends Archivist Meetings: Never  . Marital Status: Married    Tobacco Counseling Counseling given: Not Answered   Clinical Intake:  Pre-visit preparation completed: Yes  Pain : No/denies pain     Nutritional Risks: None Diabetes: No  How often do you need to have someone help you when you read instructions, pamphlets, or other written materials from your doctor or pharmacy?: 1 - Never What is the last grade level you completed in school?: college  Diabetic?no  Interpreter Needed?: No  Information entered by :: [place] of Daily Living In your present state of health, do you have any difficulty performing the following activities: 12/27/2019  Hearing? N  Vision? N  Difficulty concentrating or making decisions? N  Walking or climbing  stairs? N  Dressing or bathing? N  Doing errands, shopping? N  Some recent data might be hidden    Patient Care Team: Eulas Post, MD as PCP - General Sueanne Margarita, MD as PCP - Cardiology (Cardiology) Himmelrich, Bryson Ha, RD (Inactive) as Dietitian (Bariatrics)  Indicate any recent Medical Services you may have received from other than [facility] providers in the past year (date may be approximate).     Assessment:   This is a routine wellness examination for Debbie Werner.  Hearing/Vision screen  Hearing Screening   '125Hz'$  $Remo'250Hz'kFgnh$'500Hz'$'1000Hz'$'2000Hz'$'3000Hz'$'4000Hz'$'6000Hz'$'8000Hz'$   Right ear:           Left ear:  Vision Screening Comments: Annual eye exams   Dietary issues and exercise activities discussed:    Goals Addressed            This Visit's Progress   . Weight Loss Achieved       Evidence-based guidance:   Review medication that may contribute to weight gain, such as corticosteroid, beta-blocker, tricyclic antidepressant, oral antihyperglycemic; advocate for changes when appropriate.   Perform or refer to registered dietitian to perform comprehensive nutrition assessment that includes disordered-eating behaviors, such as binge-eating, emotional or compulsive eating, grazing.   Counsel patient regarding health risks of obesity and that weight loss goal of 5 to 10 percent of initial weight will improve risk.   Recommend initial weight loss goal of 3 to 5 percent of bodyweight; increase weight-loss goals based on patient success as achieving greater weight loss continues to reduce risk.   Propose a calorie-reduced diet based on the patient's preferences and health status.   Provide ongoing emotional support or cognitive behavioral therapy and dietitian services (individual, group, virtual) over at least 6 months with a minimum of 14 encounters to best facilitate weight loss.   Provide monthly follow-up for 12 months when weight loss goal is met to assist with  maintenance of weight loss.   Encourage increased physical activity or exercise based on individual age, risk, and ability up to 200 to 300 minutes per week that includes aerobic and resistance training.   Encourage reduction in sedentary behaviors by replacing them with nonexercise yet active leisure pursuits.   Identify physical barriers, such as change in posture, balance, gait patterns, joint pain, and environmental barriers to activity.   Consider referral to rehabilitation therapy, especially when mobility or function is impaired due to osteoarthritis and obesity.   Consider referral to weight-loss program that has published evidence of safety and efficacy if on-site intensive intervention is unavailable or patient preference.   Prepare patient for use of pharmacologic therapy as an adjunct to lifestyle changes based on body mass index, patient agreement and presence of risk factors or comorbidities.   Evaluate efficacy of pharmacologic therapy (weight loss) and tolerance to medication periodically.   Engage in shared decision-making regarding referral to bariatric surgeon for consultation and evaluation when weight-loss goal has not been accomplished by behavioral therapy with or without pharmacologic therapy.   Notes:       Depression Screen PHQ 2/9 Scores 06/23/2020  PHQ - 2 Score 0    Fall Risk Fall Risk  06/23/2020  Falls in the past year? 0  Number falls in past yr: 0  Injury with Fall? 0  Follow up Falls evaluation completed    Heath:  Any stairs in or around the home? No  If so, are there any without handrails? Yes  Home free of loose throw rugs in walkways, pet beds, electrical cords, etc? Yes  Adequate lighting in your home to reduce risk of falls? Yes   ASSISTIVE DEVICES UTILIZED TO PREVENT FALLS:  Life alert? No  Use of a cane, walker or w/c? No  Grab bars in the bathroom? Yes  Shower chair or bench in shower? Yes   Elevated toilet seat or a handicapped toilet? Yes    Cognitive Function:     Normal cognitive status assessed by direct observation by this Nurse Health Advisor. No abnormalities found.      Immunizations Immunization History  Administered Date(s) Administered  . Influenza-Unspecified 01/07/2014  . PFIZER(Purple Top)SARS-COV-2 Vaccination  11/03/2019, 11/24/2019  . Td 02/19/1997, 07/24/2006    TDAP status: Due, Education has been provided regarding the importance of this vaccine. Advised may receive this vaccine at local pharmacy or Health Dept. Aware to provide a copy of the vaccination record if obtained from local pharmacy or Health Dept. Verbalized acceptance and understanding.  Flu Vaccine status: Due, Education has been provided regarding the importance of this vaccine. Advised may receive this vaccine at local pharmacy or Health Dept. Aware to provide a copy of the vaccination record if obtained from local pharmacy or Health Dept. Verbalized acceptance and understanding.  Pneumococcal vaccine status: Declined,  Education has been provided regarding the importance of this vaccine but patient still declined. Advised may receive this vaccine at local pharmacy or Health Dept. Aware to provide a copy of the vaccination record if obtained from local pharmacy or Health Dept. Verbalized acceptance and understanding.   Covid-19 vaccine status: Completed vaccines  Qualifies for Shingles Vaccine? Yes   Zostavax completed No   Shingrix Completed?: No.    Education has been provided regarding the importance of this vaccine. Patient has been advised to call insurance company to determine out of pocket expense if they have not yet received this vaccine. Advised may also receive vaccine at local pharmacy or Health Dept. Verbalized acceptance and understanding.  Screening Tests Health Maintenance  Topic Date Due  . Hepatitis C Screening  Never done  . COLONOSCOPY (Pts 45-58yrs Insurance  coverage will need to be confirmed)  Never done  . TETANUS/TDAP  07/23/2016  . DEXA SCAN  Never done  . COVID-19 Vaccine (3 - Booster for Pfizer series) 05/24/2020  . PNA vac Low Risk Adult (1 of 2 - PCV13) 12/28/2020 (Originally 06/01/2019)  . INFLUENZA VACCINE  09/19/2020  . MAMMOGRAM  10/07/2020  . HPV VACCINES  Aged Out    Health Maintenance  Health Maintenance Due  Topic Date Due  . Hepatitis C Screening  Never done  . COLONOSCOPY (Pts 45-69yrs Insurance coverage will need to be confirmed)  Never done  . TETANUS/TDAP  07/23/2016  . DEXA SCAN  Never done  . COVID-19 Vaccine (3 - Booster for Pfizer series) 05/24/2020    Colorectal cancer screening: Referral to GI placed 06/23/2020. Pt aware the office will call re: appt.  Mammogram status: Ordered 06/23/2020. Pt provided with contact info and advised to call to schedule appt.   Bone Density status: Ordered 06/23/2020. Pt provided with contact info and advised to call to schedule appt.  Lung Cancer Screening: (Low Dose CT Chest recommended if Age 35-80 years, 30 pack-year currently smoking OR have quit w/in 15years.) does not qualify.   Lung Cancer Screening Referral: n/a   Additional Screening:  Hepatitis C Screening: does qualify   Vision Screening: Recommended annual ophthalmology exams for early detection of glaucoma and other disorders of the eye. Is the patient up to date with their annual eye exam?  Yes  Who is the provider or what is the name of the office in which the patient attends annual eye exams? Dr.Lee If pt is not established with a provider, would they like to be referred to a provider to establish care? No .   Dental Screening: Recommended annual dental exams for proper oral hygiene  Community Resource Referral / Chronic Care Management: CRR required this visit?  No   CCM required this visit?  No      Plan:     I have personally reviewed and noted the following  in the patient's chart:    . Medical and social history . Use of alcohol, tobacco or illicit drugs  . Current medications and supplements including opioid prescriptions. Patient is not currently taking opioid prescriptions. . Functional ability and status . Nutritional status . Physical activity . Advanced directives . List of other physicians . Hospitalizations, surgeries, and ER visits in previous 12 months . Vitals . Screenings to include cognitive, depression, and falls . Referrals and appointments  In addition, I have reviewed and discussed with patient certain preventive protocols, quality metrics, and best practice recommendations. A written personalized care plan for preventive services as well as general preventive health recommendations were provided to patient.     Randel Pigg, LPN   02/26/15   Nurse Notes: none

## 2020-06-24 ENCOUNTER — Encounter: Payer: Self-pay | Admitting: Family Medicine

## 2020-06-27 ENCOUNTER — Other Ambulatory Visit: Payer: Self-pay | Admitting: Family Medicine

## 2020-07-25 ENCOUNTER — Telehealth (INDEPENDENT_AMBULATORY_CARE_PROVIDER_SITE_OTHER): Payer: PPO | Admitting: Family Medicine

## 2020-07-25 ENCOUNTER — Other Ambulatory Visit: Payer: Self-pay

## 2020-07-25 DIAGNOSIS — U071 COVID-19: Secondary | ICD-10-CM | POA: Diagnosis not present

## 2020-07-25 MED ORDER — MOLNUPIRAVIR EUA 200MG CAPSULE: 4.0000 | ORAL_CAPSULE | Freq: Two times a day (BID) | ORAL | 0 refills | Status: AC

## 2020-07-25 NOTE — Progress Notes (Signed)
Patient ID: Debbie Werner, female   DOB: 1955/04/20, 65 y.o.   MRN: [id number]   This visit type was conducted due to national recommendations for restrictions regarding the COVID-19 pandemic in an effort to limit this patient's exposure and mitigate transmission in our community.   Virtual Visit via Video Note  I connected with Elliot Simoneaux on 07/25/20 at  3:30 PM EDT by a video enabled telemedicine application and verified that I am speaking with the correct person using two identifiers.  Location patient: home Location provider:work or home office Persons participating in the virtual visit: patient, provider  I discussed the limitations of evaluation and management by telemedicine and the availability of in person appointments. The patient expressed understanding and agreed to proceed.   HPI:  Debbie Werner has COVID-19.  She states that she developed some chills yesterday along with achy joints, headache, cough, nasal congestion.  She did home test which came back positive.  She has had Pfizer vaccine but not booster.  She does have history of CAD.  No recent chest pains.  No dyspnea.  Increased fatigue.  Did have very high blood pressure of 174/114 last night and took extra blood pressure pill and this morning blood pressures down around 130/90.  Denies any nausea or vomiting.  No significant fever.   ROS: See pertinent positives and negatives per HPI.  Past Medical History:  Diagnosis Date  . ALLERGIC RHINITIS 08/03/2008  . Cancer ([place])    Skin - has had "spots" removed for years  . CHEST PAIN, ATYPICAL 10/12/2008  . CONSTIPATION, CHRONIC 12/08/2009  . DEPRESSION 12/08/2009  . GERD 08/03/2008  . Gestational diabetes   . HEMATOCHEZIA 05/02/2009  . HYPERLIPIDEMIA 08/03/2008  . HYPERTENSION 08/03/2008  . Obesity   . SINUSITIS, CHRONIC 02/24/2009    Past Surgical History:  Procedure Laterality Date  . BREAST SURGERY     bx  . BREATH TEK H PYLORI  12/21/2011   Procedure: BREATH TEK H  PYLORI;  Surgeon: Pedro Earls, MD;  Location: [facility];  Service: General;  Laterality: N/A;  Katy/Andrea  . BUNIONECTOMY Left   . CYSTECTOMY  1996   ovary  . METATARSAL OSTEOTOMY Left   . NEURECTOMY FOOT Left   . TUBAL LIGATION      Family History  Problem Relation Age of Onset  . Heart disease Father 7       CABG  . Hyperlipidemia Neg Hx        parent, grandparent  . Hypertension Neg Hx        parent , grandparent  . Stroke Neg Hx        grandparent  . Diabetes Neg Hx        grandparent    SOCIAL HX: Non-smoker   Current Outpatient Medications:  .  molnupiravir EUA 200 mg CAPS, Take 4 capsules (800 mg total) by mouth 2 (two) times daily for 5 days., Disp: 40 capsule, Rfl: 0 .  aspirin EC 81 MG EC tablet, Take 1 tablet (81 mg total) by mouth daily. Swallow whole., Disp: 30 tablet, Rfl: 11 .  Diclofenac Sodium 3 % GEL, Place 1 application onto the skin 4 (four) times daily as needed., Disp: 100 g, Rfl: 5 .  Evolocumab (REPATHA SURECLICK) 829 MG/ML SOAJ, Inject 1 pen into the skin every 14 (fourteen) days., Disp: 6 mL, Rfl: 3 .  hydrochlorothiazide (MICROZIDE) 12.5 MG capsule, Take 1 capsule (12.5 mg total) by mouth daily., Disp: 30 capsule, Rfl: 0 .  lisinopril (ZESTRIL) 20 MG tablet, Take 1 tablet by mouth once daily, Disp: 90 tablet, Rfl: 0 .  valACYclovir (VALTREX) 1000 MG tablet, TAKE 2 TABLETS BY MOUTH AT ONSET OF COLD SORE AND REPEAT IN 12 HOURS, Disp: 30 tablet, Rfl: 0  EXAM:  VITALS per patient if applicable:  GENERAL: alert, oriented, appears well and in no acute distress  HEENT: atraumatic, conjunttiva clear, no obvious abnormalities on inspection of external nose and ears  NECK: normal movements of the head and neck  LUNGS: on inspection no signs of respiratory distress, breathing rate appears normal, no obvious gross SOB, gasping or wheezing  CV: no obvious cyanosis  MS: moves all visible extremities without noticeable abnormality  PSYCH/NEURO:  pleasant and cooperative, no obvious depression or anxiety, speech and thought processing grossly intact  ASSESSMENT AND PLAN:  Discussed the following assessment and plan:  COVID 19 infection.  Patient has risk factors of age and history of CAD and hypertension.  She is overall doing fairly well with no dyspnea but does have moderately severe symptoms and at risk for progression of severity  -We discussed antiviral options.  She has not had any recent lab work.  We discussed going ahead and starting Molnupiravir 200 mg 4 capsules twice daily for 5 days -Plenty of fluids and rest and follow-up immediately for increased shortness of breath or other concerns -Reviewed CDC isolation guidelines     I discussed the assessment and treatment plan with the patient. The patient was provided an opportunity to ask questions and all were answered. The patient agreed with the plan and demonstrated an understanding of the instructions.   The patient was advised to call back or seek an in-person evaluation if the symptoms worsen or if the condition fails to improve as anticipated.     Carolann Littler, MD

## 2020-08-17 ENCOUNTER — Telehealth: Payer: Self-pay | Admitting: Cardiology

## 2020-08-17 DIAGNOSIS — R0600 Dyspnea, unspecified: Secondary | ICD-10-CM

## 2020-08-17 DIAGNOSIS — E669 Obesity, unspecified: Secondary | ICD-10-CM

## 2020-08-17 DIAGNOSIS — R0609 Other forms of dyspnea: Secondary | ICD-10-CM

## 2020-08-17 NOTE — Telephone Encounter (Signed)
Pt c/o Shortness Of Breath: STAT if SOB developed within the last 24 hours or pt is noticeably SOB on the phone  1. Are you currently SOB (can you hear that pt is SOB on the phone)? No - pt sent message to scheduling to schedule appt for SOB. No available appointments until October.   2. How long have you been experiencing SOB? A couple of weeks but more consistently short of breath the last few days.   3. Are you SOB when sitting or when up moving around? Mostly when active but also when sitting.   4. Are you currently experiencing any other symptoms? Occasional chest pain.

## 2020-08-17 NOTE — Telephone Encounter (Signed)
Spoke with the patient and advised her that Dr. Radford Pax would like for her to have a lexiscan myoview. Instructions have been explained and will be sent through [place]. Patient is aware that our scheduler will be in contact with her to set it up. Patient verbalized understanding.  FU appointment will be made after stress test is scheduled.

## 2020-08-17 NOTE — Telephone Encounter (Signed)
Spoke with the patient who states that she has had worsening SOB and occasional chest pain. She reports SOB occurs with minimal exertion. 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 She states that she has had some episodes of sharp chest pain. She says that they don't last long. She is not having any CP currently. She reports one episode the pain did radiate into her left shoulder and arm. She reports blood pressure has been good around 125/86 and O2 at 97%. Patient has been scheduled for an appointment 7/5 and advised to go to the ER if chest pain returns or symptoms worsen.

## 2020-08-18 NOTE — Telephone Encounter (Signed)
Shared Decision Making/Informed Consent The risks [chest pain, shortness of breath, cardiac arrhythmias, dizziness, blood pressure fluctuations, myocardial infarction, stroke/transient ischemic attack, nausea, vomiting, allergic reaction, radiation exposure, metallic taste sensation and life-threatening complications (estimated to be 1 in 10,000)], benefits (risk stratification, diagnosing coronary artery disease, treatment guidance) and alternatives of a nuclear stress test were discussed in detail with Debbie Werner and she agrees to proceed.

## 2020-08-23 ENCOUNTER — Ambulatory Visit: Payer: PPO | Admitting: Cardiology

## 2020-09-01 ENCOUNTER — Telehealth: Payer: Self-pay

## 2020-09-01 NOTE — Telephone Encounter (Signed)
Attempted to contact the patient, but her voicemail isn't set up. Will try again later. S.Kathelene Rumberger EMTP

## 2020-09-06 ENCOUNTER — Ambulatory Visit (HOSPITAL_COMMUNITY): Payer: PPO | Attending: Cardiology

## 2020-09-06 ENCOUNTER — Other Ambulatory Visit: Payer: Self-pay

## 2020-09-06 DIAGNOSIS — R0609 Other forms of dyspnea: Secondary | ICD-10-CM

## 2020-09-06 DIAGNOSIS — R0600 Dyspnea, unspecified: Secondary | ICD-10-CM | POA: Diagnosis not present

## 2020-09-06 LAB — MYOCARDIAL PERFUSION IMAGING
LV dias vol: 28 mL (ref 46–106)
LV sys vol: 3 mL
Peak HR: 93 {beats}/min
Rest HR: 63 {beats}/min
SDS: 3
SRS: 2
SSS: 4
TID: 0.77

## 2020-09-06 MED ORDER — TECHNETIUM TC 99M TETROFOSMIN IV KIT
31.9000 | PACK | Freq: Once | INTRAVENOUS | Status: AC | PRN
  Administered 2020-09-06: 31.9 via INTRAVENOUS
  Filled 2020-09-06: qty 32

## 2020-09-06 MED ORDER — TECHNETIUM TC 99M TETROFOSMIN IV KIT
11.0000 | PACK | Freq: Once | INTRAVENOUS | Status: AC | PRN
  Administered 2020-09-06: 11 via INTRAVENOUS
  Filled 2020-09-06: qty 11

## 2020-09-06 MED ORDER — REGADENOSON 0.4 MG/5ML IV SOLN
0.4000 mg | Freq: Once | INTRAVENOUS | Status: AC
  Administered 2020-09-06: 0.4 mg via INTRAVENOUS

## 2020-09-26 ENCOUNTER — Other Ambulatory Visit: Payer: Self-pay | Admitting: Family Medicine

## 2020-11-30 ENCOUNTER — Telehealth: Payer: Self-pay

## 2020-11-30 NOTE — Telephone Encounter (Signed)
Spoke with the patient. I explained that insurance will not cover the labs without a visit. She was due for her AWV and agreed to set up that appointment and that labs could be done at that time. Appointment has been scheduled. Nothing further needed.

## 2020-11-30 NOTE — Telephone Encounter (Signed)
Patient called asking if orders could be put in for labs I informed patient office visit needs to be scheduled pt would like a call back to discuss.

## 2020-12-12 ENCOUNTER — Other Ambulatory Visit: Payer: Self-pay | Admitting: Family Medicine

## 2021-01-09 ENCOUNTER — Ambulatory Visit (INDEPENDENT_AMBULATORY_CARE_PROVIDER_SITE_OTHER): Payer: PPO | Admitting: Family Medicine

## 2021-01-09 VITALS — BP 120/78 | HR 75 | Temp 97.9°F | Wt 169.4 lb

## 2021-01-09 DIAGNOSIS — Z1159 Encounter for screening for other viral diseases: Secondary | ICD-10-CM

## 2021-01-09 DIAGNOSIS — R1310 Dysphagia, unspecified: Secondary | ICD-10-CM

## 2021-01-09 DIAGNOSIS — Z0000 Encounter for general adult medical examination without abnormal findings: Secondary | ICD-10-CM

## 2021-01-09 LAB — LIPID PANEL
Cholesterol: 133 mg/dL (ref 0–200)
HDL: 41.6 mg/dL (ref >39.00)
LDL Cholesterol: 56 mg/dL (ref 0–99)
NonHDL: 91.52
Total CHOL/HDL Ratio: 3
Triglycerides: 177 mg/dL — ABNORMAL HIGH (ref 0.0–149.0)
VLDL: 35.4 mg/dL (ref 0.0–40.0)

## 2021-01-09 LAB — CBC WITH DIFFERENTIAL/PLATELET
Basophils Absolute: 0.1 10*3/uL (ref 0.0–0.1)
Basophils Relative: 0.9 % (ref 0.0–3.0)
Eosinophils Absolute: 0.3 10*3/uL (ref 0.0–0.7)
Eosinophils Relative: 4.8 % (ref 0.0–5.0)
HCT: 40.8 % (ref 36.0–46.0)
Hemoglobin: 13.3 g/dL (ref 12.0–15.0)
Lymphocytes Relative: 29 % (ref 12.0–46.0)
Lymphs Abs: 2 10*3/uL (ref 0.7–4.0)
MCHC: 32.6 g/dL (ref 30.0–36.0)
MCV: 89.5 fl (ref 78.0–100.0)
Monocytes Absolute: 0.6 10*3/uL (ref 0.1–1.0)
Monocytes Relative: 8.2 % (ref 3.0–12.0)
Neutro Abs: 3.9 10*3/uL (ref 1.4–7.7)
Neutrophils Relative %: 57.1 % (ref 43.0–77.0)
Platelets: 343 10*3/uL (ref 150.0–400.0)
RBC: 4.56 Mil/uL (ref 3.87–5.11)
RDW: 13.6 % (ref 11.5–15.5)
WBC: 6.8 10*3/uL (ref 4.0–10.5)

## 2021-01-09 LAB — HEPATIC FUNCTION PANEL
ALT: 20 U/L (ref 0–35)
AST: 21 U/L (ref 0–37)
Albumin: 4.1 g/dL (ref 3.5–5.2)
Alkaline Phosphatase: 70 U/L (ref 39–117)
Bilirubin, Direct: 0.1 mg/dL (ref 0.0–0.3)
Total Bilirubin: 0.6 mg/dL (ref 0.2–1.2)
Total Protein: 6.6 g/dL (ref 6.0–8.3)

## 2021-01-09 LAB — BASIC METABOLIC PANEL
BUN: 15 mg/dL (ref 6–23)
CO2: 26 mEq/L (ref 19–32)
Calcium: 9.2 mg/dL (ref 8.4–10.5)
Chloride: 107 mEq/L (ref 96–112)
Creatinine, Ser: 0.9 mg/dL (ref 0.40–1.20)
GFR: 66.57 mL/min (ref >60.00)
Glucose, Bld: 84 mg/dL (ref 70–99)
Potassium: 4.4 mEq/L (ref 3.5–5.1)
Sodium: 140 mEq/L (ref 135–145)

## 2021-01-09 LAB — HEMOGLOBIN A1C: Hgb A1c MFr Bld: 5.6 % (ref 4.6–6.5)

## 2021-01-09 LAB — TSH: TSH: 5.86 u[IU]/mL — ABNORMAL HIGH (ref 0.35–5.50)

## 2021-01-09 MED ORDER — VALACYCLOVIR HCL 1 G PO TABS
ORAL_TABLET | ORAL | 0 refills | Status: DC
Start: 2021-01-09 — End: 2021-09-18

## 2021-01-09 MED ORDER — METHOCARBAMOL 500 MG PO TABS: 500.0000 mg | ORAL_TABLET | Freq: Three times a day (TID) | ORAL | 1 refills | Status: DC | PRN

## 2021-01-09 NOTE — Progress Notes (Signed)
Established Patient Office Visit  Subjective:  Patient ID: Debbie Werner, female    DOB: 1955/08/15  Age: 65 y.o. MRN: [id number]  CC:  Chief Complaint  Patient presents with   Annual Exam    HPI [name] presents for physical exam-another issue as below.  She is generally doing well with exception of couple of acute issues as below.  Recent somewhat progressive solid food dysphagia.  No loss of appetite.  No weight loss.  She first noticed with things like rice.  Symptoms have been progressive.  She would like to see GI for this.  No recent active reflux symptoms.  No history of screening colonoscopy.  She is willing to consider at this time.  Other issue is some bilateral anterior hip pain.  Radiates toward the thigh.  She has noticed that heat and one of her husband's muscle relaxer medication seemed to help significantly.  Denies any recent injury.  Currently no low back pain.  No medial hip pain with ambulation.  Denies any lower extremity numbness or weakness.  Health maintenance reviewed  -No history of colon cancer screening.  She is willing to consider at this time.  Her main reluctance in the past has been her hesitancy to do liquid prep. -Declines flu vaccine -Declines tetanus booster -Declines shingles vaccine -Declines Prevnar 20 but she is willing to consider -Mammogram up-to-date.  She has one scheduled actually for December for repeat -No history of hepatitis C screening but low risk  Family history-Father had heart disease age 22.  Her mother is 36 and alive and doing reasonly well.  Past Medical History:  Diagnosis Date   ALLERGIC RHINITIS 08/03/2008   Cancer ([place])    Skin - has had "spots" removed for years   CHEST PAIN, ATYPICAL 10/12/2008   CONSTIPATION, CHRONIC 12/08/2009   DEPRESSION 12/08/2009   GERD 08/03/2008   Gestational diabetes    HEMATOCHEZIA 05/02/2009   HYPERLIPIDEMIA 08/03/2008   HYPERTENSION 08/03/2008   Obesity    SINUSITIS, CHRONIC  02/24/2009    Past Surgical History:  Procedure Laterality Date   BREAST SURGERY     bx   BREATH TEK H PYLORI  12/21/2011   Procedure: BREATH TEK H PYLORI;  Surgeon: Pedro Earls, MD;  Location: [facility];  Service: General;  Laterality: N/A;  Katy/Andrea   BUNIONECTOMY Left    CYSTECTOMY  1996   [place]   METATARSAL OSTEOTOMY Left    NEURECTOMY FOOT Left    TUBAL LIGATION      Family History  Problem Relation Age of Onset   Heart disease Father 38       CABG   Hyperlipidemia Neg Hx        parent, grandparent   Hypertension Neg Hx        parent , grandparent   Stroke Neg Hx        grandparent   Diabetes Neg Hx        grandparent    Social History   Socioeconomic History   Marital status: Married    Spouse name: Not on file   Number of children: Not on file   Years of education: Not on file   Highest education level: Not on file  Occupational History   Not on file  Tobacco Use   Smoking status: Never   Smokeless tobacco: Never  Vaping Use   Vaping Use: Never used  Substance and Sexual Activity   Alcohol use: No  Alcohol/week: 0.0 standard drinks   Drug use: No   Sexual activity: Not on file  Other Topics Concern   Not on file  Social History Narrative   Not on file   Social Determinants of Health   Financial Resource Strain: Low Risk    Difficulty of Paying Living Expenses: Not hard at all  Food Insecurity: No Food Insecurity   Worried About Charity fundraiser in the Last Year: Never true   [place] in the Last Year: Never true  Transportation Needs: No Transportation Needs   Lack of Transportation (Medical): No   Lack of Transportation (Non-Medical): No  Physical Activity: Insufficiently Active   Days of Exercise per Week: 3 days   Minutes of Exercise per Session: 30 min  Stress: No Stress Concern Present   Feeling of Stress : Not at all  Social Connections: Moderately Integrated   Frequency of Communication with Friends and Family:  More than three times a week   Frequency of Social Gatherings with Friends and Family: More than three times a week   Attends Religious Services: 1 to 4 times per year   Active Member of [employer] or Organizations: No   Attends Archivist Meetings: Never   Marital Status: Married  Human resources officer Violence: Not At Risk   Fear of Current or Ex-Partner: No   Emotionally Abused: No   Physically Abused: No   Sexually Abused: No    Outpatient Medications Prior to Visit  Medication Sig Dispense Refill   aspirin EC 81 MG EC tablet Take 1 tablet (81 mg total) by mouth daily. Swallow whole. 30 tablet 11   Diclofenac Sodium 3 % GEL Place 1 application onto the skin 4 (four) times daily as needed. 100 g 5   Evolocumab (REPATHA SURECLICK) 992 MG/ML SOAJ Inject 1 pen into the skin every 14 (fourteen) days. 6 mL 3   hydrochlorothiazide (MICROZIDE) 12.5 MG capsule Take 1 capsule (12.5 mg total) by mouth daily. 30 capsule 0   lisinopril (ZESTRIL) 20 MG tablet Take 1 tablet by mouth once daily 90 tablet 0   valACYclovir (VALTREX) 1000 MG tablet TAKE 2 TABLETS BY MOUTH AT ONSET OF COLD SORE AND REPEAT IN 12 HOURS 30 tablet 0   No facility-administered medications prior to visit.    Allergies  Allergen Reactions   Anesthetics, Amide Nausea Only   Pravastatin Sodium     REACTION: effects brain memory   Sulfadiazine     GI upset    ROS Review of Systems  Constitutional:  Negative for fatigue.  Eyes:  Negative for visual disturbance.  Respiratory:  Negative for cough, chest tightness, shortness of breath and wheezing.   Cardiovascular:  Negative for chest pain, palpitations and leg swelling.  Endocrine: Negative for polydipsia and polyuria.  Neurological:  Negative for dizziness, seizures, syncope, weakness, light-headedness and headaches.     Objective:    Physical Exam Constitutional:      Appearance: She is well-developed.  HENT:     Head: Normocephalic and atraumatic.  Eyes:      Pupils: Pupils are equal, round, and reactive to light.  Neck:     Thyroid: No thyromegaly.  Cardiovascular:     Rate and Rhythm: Normal rate and regular rhythm.     Heart sounds: Normal heart sounds. No murmur heard. Pulmonary:     Effort: No respiratory distress.     Breath sounds: Normal breath sounds. No wheezing or rales.  Abdominal:  General: Bowel sounds are normal. There is no distension.     Palpations: Abdomen is soft. There is no mass.     Tenderness: There is no abdominal tenderness. There is no guarding or rebound.  Musculoskeletal:        General: Normal range of motion.     Cervical back: Normal range of motion and neck supple.     Comments: Excellent range of motion both hips  Lymphadenopathy:     Cervical: No cervical adenopathy.  Skin:    Findings: No rash.  Neurological:     Mental Status: She is alert and oriented to person, place, and time.     Cranial Nerves: No cranial nerve deficit.     Deep Tendon Reflexes: Reflexes normal.  Psychiatric:        Behavior: Behavior normal.        Thought Content: Thought content normal.        Judgment: Judgment normal.    BP 120/78 (BP Location: Left Arm, Patient Position: Sitting, Cuff Size: Normal)   Pulse 75   Temp 97.9 F (36.6 C) (Oral)   Wt 169 lb 6.4 oz (76.8 kg)   SpO2 99%   BMI 32.01 kg/m  Wt Readings from Last 3 Encounters:  01/09/21 169 lb 6.4 oz (76.8 kg)  09/06/20 168 lb (76.2 kg)  01/26/20 168 lb (76.2 kg)     Health Maintenance Due  Topic Date Due   Hepatitis C Screening  Never done   COLONOSCOPY (Pts 45-76yrs Insurance coverage will need to be confirmed)  Never done   DEXA SCAN  Never done   MAMMOGRAM  10/07/2020    There are no preventive care reminders to display for this patient.  Lab Results  Component Value Date   TSH 8.887 (H) 12/27/2019   Lab Results  Component Value Date   WBC 9.6 12/26/2019   HGB 13.7 12/26/2019   HCT 43.2 12/26/2019   MCV 92.9 12/26/2019   PLT 340  12/26/2019   Lab Results  Component Value Date   NA 140 12/26/2019   K 4.3 12/26/2019   CO2 25 12/26/2019   GLUCOSE 103 (H) 12/26/2019   BUN 12 12/26/2019   CREATININE 0.89 12/26/2019   BILITOT 0.5 04/21/2020   ALKPHOS 105 04/21/2020   AST 15 04/21/2020   ALT 21 04/21/2020   PROT 6.3 04/21/2020   ALBUMIN 4.0 04/21/2020   CALCIUM 9.2 12/26/2019   ANIONGAP 8 12/26/2019   GFR 59.16 (L) 10/02/2018   Lab Results  Component Value Date   CHOL 126 04/21/2020   Lab Results  Component Value Date   HDL 40 04/21/2020   Lab Results  Component Value Date   LDLCALC 63 04/21/2020   Lab Results  Component Value Date   TRIG 126 04/21/2020   Lab Results  Component Value Date   CHOLHDL 3.2 04/21/2020   Lab Results  Component Value Date   HGBA1C 5.5 02/06/2019      Assessment & Plan:   Problem List Items Addressed This Visit   None Visit Diagnoses     Dysphagia, unspecified type    -  Primary   Relevant Orders   Ambulatory referral to Gastroenterology   Physical exam       Relevant Orders   Basic metabolic panel   Lipid panel   CBC with Differential/Platelet   TSH   Hepatic function panel   Hemoglobin A1c   Encounter for hepatitis C screening test for low risk patient  Relevant Orders   Hep C Antibody     -Recommend flu vaccine but she declines -Recommend consideration for Shingrix vaccine.  She declines at this time -Recommend Prevnar 20 and she declines -Set up GI referral.  She has had some recent progressive dysphagia and also no history of colon cancer screening previously. =-Check hepatitis C antibody -Prescription for Robaxin 500 mg take 1 nightly.  She states this has helped for her recent bilateral thigh pain.  Suspect this may be more muscular.  Doubt related to osteoarthritis in the hips. -Continue with annual mammogram  Meds ordered this encounter  Medications   valACYclovir (VALTREX) 1000 MG tablet    Sig: TAKE 2 TABLETS BY MOUTH AT ONSET OF  COLD SORE AND REPEAT IN 12 HOURS    Dispense:  30 tablet    Refill:  0   methocarbamol (ROBAXIN) 500 MG tablet    Sig: Take 1 tablet (500 mg total) by mouth every 8 (eight) hours as needed for muscle spasms.    Dispense:  30 tablet    Refill:  1    Follow-up: No follow-ups on file.    Carolann Littler, MD

## 2021-01-09 NOTE — Patient Instructions (Signed)
Consider Prevnar 20 vaccine at some point this year  Let us know also if you decide to get Shingrix.

## 2021-01-10 LAB — HEPATITIS C ANTIBODY
Hepatitis C Ab: NONREACTIVE
SIGNAL TO CUT-OFF: 0.03 (ref <1.00)

## 2021-01-26 DIAGNOSIS — Z1231 Encounter for screening mammogram for malignant neoplasm of breast: Secondary | ICD-10-CM | POA: Diagnosis not present

## 2021-01-26 LAB — HM MAMMOGRAPHY

## 2021-01-30 ENCOUNTER — Encounter: Payer: Self-pay | Admitting: Family Medicine

## 2021-02-06 ENCOUNTER — Encounter: Payer: Self-pay | Admitting: Internal Medicine

## 2021-03-07 ENCOUNTER — Ambulatory Visit (INDEPENDENT_AMBULATORY_CARE_PROVIDER_SITE_OTHER): Payer: PPO | Admitting: Internal Medicine

## 2021-03-07 ENCOUNTER — Encounter: Payer: Self-pay | Admitting: Internal Medicine

## 2021-03-07 VITALS — BP 128/84 | HR 64 | Ht 61.0 in | Wt 170.8 lb

## 2021-03-07 DIAGNOSIS — K219 Gastro-esophageal reflux disease without esophagitis: Secondary | ICD-10-CM

## 2021-03-07 DIAGNOSIS — R140 Abdominal distension (gaseous): Secondary | ICD-10-CM | POA: Diagnosis not present

## 2021-03-07 DIAGNOSIS — Z8249 Family history of ischemic heart disease and other diseases of the circulatory system: Secondary | ICD-10-CM | POA: Diagnosis not present

## 2021-03-07 DIAGNOSIS — R1310 Dysphagia, unspecified: Secondary | ICD-10-CM

## 2021-03-07 DIAGNOSIS — R1013 Epigastric pain: Secondary | ICD-10-CM

## 2021-03-07 DIAGNOSIS — K5900 Constipation, unspecified: Secondary | ICD-10-CM | POA: Diagnosis not present

## 2021-03-07 DIAGNOSIS — Z1211 Encounter for screening for malignant neoplasm of colon: Secondary | ICD-10-CM

## 2021-03-07 MED ORDER — OMEPRAZOLE 40 MG PO CPDR: 40.0000 mg | DELAYED_RELEASE_CAPSULE | Freq: Every day | ORAL | 5 refills | Status: DC

## 2021-03-07 NOTE — Patient Instructions (Addendum)
You will be contacted by [facility] in the next 2 days to arrange a abdominal Ultrasound.  The number on your caller ID will be [phone], please answer when they call.  If you have not heard from them in 2 days please call [phone] to schedule.    You have been scheduled for an endoscopy. Please follow written instructions given to you at your visit today. If you use inhalers (even only as needed), please bring them with you on the day of your procedure.   Your provider has requested that you go to the basement level for lab work before leaving today. Press "B" on the elevator. The lab is located at the first door on the left as you exit the elevator.  Due to recent changes in healthcare laws, you may see the results of your imaging and laboratory studies on MyChart before your provider has had a chance to review them.  We understand that in some cases there may be results that are confusing or concerning to you. Not all laboratory results come back in the same time frame and the provider may be waiting for multiple results in order to interpret others.  Please give [place] 48 hours in order for your provider to thoroughly review all the results before contacting the office for clarification of your results.   We have sent the following medications to your pharmacy for you to pick up at your convenience: Omeprazole  Drink 8 cups of water daily. Walk 30 minutes daily. Take fiber daily- try benefiber or metamucil. These are over the counter.   I appreciate the opportunity to care for you. Dayna Barker, MD

## 2021-03-07 NOTE — Progress Notes (Signed)
Chief Complaint: Dysphagia  HPI : 66 year old female with history of GERD and obesity presents with dysphagia  She has been having off-and-on dysphagia for several years, which has been getting worse over time. She does not have dysphagia every time that she eats. She will swallow and feel it get food stuck in the bottom of her chest. Sometimes she will have to go to the bathroom and throw it up when it gest stuck. If she stretches her chest out, it will help somewhat with her symptoms. Dysphagia occurs to solids, not liquids. The dysphagia occurs a few times a week. She has a lot of issues with gas. She will feel very bloated and her stomach will get hard as a rock. She will experience severe pain in her right side on occasion. Once she takes Gas-X and Tums, the pain would get better. In the past 2 weeks, she has been drinking protein shakes twice a day and eating once a day to try to help with her GI symptoms. Denies weight loss. Denies melena, hematochezia, diarrhea. She has a lot of constipation issues for which she has to drink Miralax twice a week. She will have have a daily BM for the last 40 years every since she gave birth to her child. Endorses chest burning and regurgitation. She will have a soreness in the epigastric area. Endorses nausea. Denies prior colonoscopy. She has had a EGD in the remote past.  She has been doing stool studies for colon cancer screening, but her last stool test was performed 2 years ago.  Denies fam hx of colon cancer. She will take 4 Aleve per day and has recently started taking some ibuprofen  Wt Readings from Last 3 Encounters:  03/07/21 170 lb 12.8 oz (77.5 kg)  01/09/21 169 lb 6.4 oz (76.8 kg)  09/06/20 168 lb (76.2 kg)   Past Medical History:  Diagnosis Date   ALLERGIC RHINITIS 08/03/2008   Basal cell carcinoma    nose   Cancer ([facility])    Skin - has had "spots" removed for years   CHEST PAIN, ATYPICAL 10/12/2008   CONSTIPATION, CHRONIC 12/08/2009    COVID    DEPRESSION 12/08/2009   GERD 08/03/2008   Gestational diabetes    HEMATOCHEZIA 05/02/2009   HYPERLIPIDEMIA 08/03/2008   HYPERTENSION 08/03/2008   Obesity    SINUSITIS, CHRONIC 02/24/2009     Past Surgical History:  Procedure Laterality Date   BREAST SURGERY     bx   BREATH TEK H PYLORI  12/21/2011   Procedure: BREATH TEK H PYLORI;  Surgeon: Pedro Earls, MD;  Location: [facility];  Service: General;  Laterality: N/A;  Katy/Andrea   BUNIONECTOMY Left    CYSTECTOMY  1996   [place]   METATARSAL OSTEOTOMY Left    NEURECTOMY FOOT Left    TUBAL LIGATION     Family History  Problem Relation Age of Onset   Heart disease Father 53       CABG   Hyperlipidemia Neg Hx        parent, grandparent   Hypertension Neg Hx        parent , grandparent   Stroke Neg Hx        grandparent   Diabetes Neg Hx        grandparent   Social History   Tobacco Use   Smoking status: Never   Smokeless tobacco: Never  Vaping Use   Vaping Use: Never used  Substance Use Topics  Alcohol use: No    Alcohol/week: 0.0 standard drinks   Drug use: No   Current Outpatient Medications  Medication Sig Dispense Refill   aspirin EC 81 MG EC tablet Take 1 tablet (81 mg total) by mouth daily. Swallow whole. 30 tablet 11   Diclofenac Sodium 3 % GEL Place 1 application onto the skin 4 (four) times daily as needed. 100 g 5   Evolocumab (REPATHA SURECLICK) 161 MG/ML SOAJ Inject 1 pen into the skin every 14 (fourteen) days. 6 mL 3   hydrochlorothiazide (MICROZIDE) 12.5 MG capsule Take 1 capsule (12.5 mg total) by mouth daily. 30 capsule 0   lisinopril (ZESTRIL) 20 MG tablet Take 1 tablet by mouth once daily 90 tablet 0   methocarbamol (ROBAXIN) 500 MG tablet Take 1 tablet (500 mg total) by mouth every 8 (eight) hours as needed for muscle spasms. 30 tablet 1   valACYclovir (VALTREX) 1000 MG tablet TAKE 2 TABLETS BY MOUTH AT ONSET OF COLD SORE AND REPEAT IN 12 HOURS 30 tablet 0   No current  facility-administered medications for this visit.   Allergies  Allergen Reactions   Anesthetics, Amide Nausea Only   Pravastatin Sodium     REACTION: effects brain memory   Sulfadiazine     GI upset     Review of Systems: All systems reviewed and negative except where noted in HPI.   Physical Exam: BP 128/84    Pulse 64    Ht 5\' 1"  (1.549 m)    Wt 170 lb 12.8 oz (77.5 kg)    SpO2 96%    BMI 32.27 kg/m  Constitutional: Pleasant,well-developed, female in no acute distress. HEENT: Normocephalic and atraumatic. Conjunctivae are normal. No scleral icterus. Cardiovascular: Normal rate, regular rhythm.  Pulmonary/chest: Effort normal and breath sounds normal. No wheezing, rales or rhonchi. Abdominal: Soft, nondistended, tender in the epigastric area. Bowel sounds active throughout. There are no masses palpable. No hepatomegaly. Extremities: No edema Neurological: Alert and oriented to person place and time. Skin: Skin is warm and dry. No rashes noted. Psychiatric: Normal mood and affect. Behavior is normal.  Labs 12/2020: CBC and CMP unremarkable. HCV Ab negative. TSH elevated at 5.86   ASSESSMENT AND PLAN:  Dysphagia GERD Epigastric discomfort Abdominal bloating Constipation Family history of AAA Colon cancer screening Patient presents with issues with solid food dysphagia, constipation, and bloating.  Patient's symptoms may be related to uncontrolled reflux so we will start omeprazole 40 mg daily.  Will perform EGD to rule out possibility of esophageal stricture and to look for signs of PUD (since patient has history of NSAID use).  Will also attempt to improve her issues with constipation by instituting conservative therapies for constipation.  Since patient has family history of abdominal aortic aneurysms, will cloud and perform AAA screening. - Will give GERD handout - Start omeprazole 40 mg QD. Take 30 min before eating - Abdominal ultrasound to look for AAA - Encourage 8  cups of per day - Walking 30 minutes per day - Start daily fiber supplement with Benefiber or Metamucil - EGD LEC.  Offered colonoscopy but patient is not interested at this time - Will plan for FIT test for colon cancer screening - RTC 2 months. Consider referral to pelvic floor PT in the future  [name], MD  I spent 61 minutes of time, including in depth chart review, independent review of results as outlined above, communicating results with the patient directly, face-to-face time with the patient, coordinating care,  ordering studies and medications as appropriate, and documentation.

## 2021-03-09 ENCOUNTER — Telehealth: Payer: Self-pay | Admitting: Family Medicine

## 2021-03-09 NOTE — Telephone Encounter (Signed)
Last filled by Holli Humbles. Ok to refill?

## 2021-03-09 NOTE — Telephone Encounter (Signed)
Patient called to get refill on aspirin EC 81 MG EC tablet    Please send to  [facility], [place] [address] Phone:  [phone]  Fax:  [phone]         Please advise

## 2021-03-10 MED ORDER — ASPIRIN 81 MG PO TBEC: 81.0000 mg | DELAYED_RELEASE_TABLET | Freq: Every day | ORAL | 11 refills | Status: DC

## 2021-03-10 NOTE — Telephone Encounter (Signed)
Prescription has been sent in. Spoke with the patient and she is aware.

## 2021-03-11 ENCOUNTER — Other Ambulatory Visit: Payer: Self-pay | Admitting: Family Medicine

## 2021-03-14 ENCOUNTER — Other Ambulatory Visit: Payer: Self-pay

## 2021-03-14 MED ORDER — REPATHA SURECLICK 140 MG/ML ~~LOC~~ SOAJ: 1.0000 "pen " | SUBCUTANEOUS | 0 refills | Status: DC

## 2021-03-14 NOTE — Telephone Encounter (Signed)
Pt called, left msg on voicemail requesting refill of Repatha, did not specify where to send refill only requested call back to discuss.

## 2021-03-14 NOTE — Telephone Encounter (Signed)
Returned cll to pt to let them know I sent rx to the pharmacy on file and to call back if they still need to discuss

## 2021-03-15 ENCOUNTER — Other Ambulatory Visit: Payer: Self-pay

## 2021-03-15 ENCOUNTER — Ambulatory Visit (INDEPENDENT_AMBULATORY_CARE_PROVIDER_SITE_OTHER): Payer: PPO

## 2021-03-15 ENCOUNTER — Ambulatory Visit (INDEPENDENT_AMBULATORY_CARE_PROVIDER_SITE_OTHER): Payer: PPO | Admitting: Family Medicine

## 2021-03-15 VITALS — BP 140/90 | HR 85 | Temp 97.7°F | Wt 175.3 lb

## 2021-03-15 DIAGNOSIS — M25561 Pain in right knee: Secondary | ICD-10-CM | POA: Diagnosis not present

## 2021-03-15 DIAGNOSIS — M1711 Unilateral primary osteoarthritis, right knee: Secondary | ICD-10-CM | POA: Diagnosis not present

## 2021-03-15 MED ORDER — MELOXICAM 15 MG PO TABS: 15.0000 mg | ORAL_TABLET | Freq: Every day | ORAL | 0 refills | Status: DC

## 2021-03-15 NOTE — Patient Instructions (Signed)
Try the Meloxicam for a few weeks and let me know or follow up if no improvement  X-ray does show some mild degenerative changes.

## 2021-03-15 NOTE — Progress Notes (Signed)
Established Patient Office Visit  Subjective:  Patient ID: Debbie Werner, female    DOB: August 07, 1955  Age: 66 y.o. MRN: [id number]  CC:  Chief Complaint  Patient presents with   Knee Pain    X2 weeks, pain starts at the knee cap and radiates down the leg, some swelling, reoccurring issue.     HPI Debbie Werner presents for approximately 90-month history of right knee pain.  She does relate back in November following a golf course with hyperflexion type injury to the knee.  She was able to bear weight afterwards.  Does not recall any visible bruising at that time.  She has had particularly increased pain past month or so.  Doing some icing at night.  She feels like there may be a little swelling in the popliteal region.  No locking or giving way.  Pain radiates somewhat inferiorly and medially.  No warmth.  No erythema.  No prior knee surgery.  Does have some crepitus with flexion extension and occasional stiffness.  Past Medical History:  Diagnosis Date   ALLERGIC RHINITIS 08/03/2008   Basal cell carcinoma    nose   Cancer (HCC)    Skin - has had "spots" removed for years   CHEST PAIN, ATYPICAL 10/12/2008   CONSTIPATION, CHRONIC 12/08/2009   COVID    DEPRESSION 12/08/2009   GERD 08/03/2008   Gestational diabetes    HEMATOCHEZIA 05/02/2009   HYPERLIPIDEMIA 08/03/2008   HYPERTENSION 08/03/2008   Obesity    SINUSITIS, CHRONIC 02/24/2009    Past Surgical History:  Procedure Laterality Date   BREAST SURGERY     bx   BREATH TEK H PYLORI  12/21/2011   Procedure: BREATH TEK H PYLORI;  Surgeon: Pedro Earls, MD;  Location: [facility];  Service: General;  Laterality: N/A;  Katy/Andrea   BUNIONECTOMY Left    CYSTECTOMY  1996   [place]   METATARSAL OSTEOTOMY Left    NEURECTOMY FOOT Left    TUBAL LIGATION      Family History  Problem Relation Age of Onset   Heart disease Father 40       CABG   Hyperlipidemia Neg Hx        parent, grandparent   Hypertension Neg Hx         parent , grandparent   Stroke Neg Hx        grandparent   Diabetes Neg Hx        grandparent    Social History   Socioeconomic History   Marital status: Married    Spouse name: Not on file   Number of children: Not on file   Years of education: Not on file   Highest education level: Not on file  Occupational History   Not on file  Tobacco Use   Smoking status: Never   Smokeless tobacco: Never  Vaping Use   Vaping Use: Never used  Substance and Sexual Activity   Alcohol use: No    Alcohol/week: 0.0 standard drinks   Drug use: No   Sexual activity: Not on file  Other Topics Concern   Not on file  Social History Narrative   Not on file   Social Determinants of Health   Financial Resource Strain: Low Risk    Difficulty of Paying Living Expenses: Not hard at all  Food Insecurity: No Food Insecurity   Worried About Estate manager/land agent of Food in the Last Year: Never true   Arboriculturist in  the Last Year: Never true  Transportation Needs: No Transportation Needs   Lack of Transportation (Medical): No   Lack of Transportation (Non-Medical): No  Physical Activity: Insufficiently Active   Days of Exercise per Week: 3 days   Minutes of Exercise per Session: 30 min  Stress: No Stress Concern Present   Feeling of Stress : Not at all  Social Connections: Moderately Integrated   Frequency of Communication with Friends and Family: More than three times a week   Frequency of Social Gatherings with Friends and Family: More than three times a week   Attends Religious Services: 1 to 4 times per year   Active Member of [employer] or Organizations: No   Attends Archivist Meetings: Never   Marital Status: Married  Human resources officer Violence: Not At Risk   Fear of Current or Ex-Partner: No   Emotionally Abused: No   Physically Abused: No   Sexually Abused: No    Outpatient Medications Prior to Visit  Medication Sig Dispense Refill   aspirin 81 MG EC tablet Take 1 tablet (81  mg total) by mouth daily. Swallow whole. 30 tablet 11   Diclofenac Sodium 3 % GEL Place 1 application onto the skin 4 (four) times daily as needed. 100 g 5   Evolocumab (REPATHA SURECLICK) 237 MG/ML SOAJ Inject 1 pen into the skin every 14 (fourteen) days. Please schedule an appointment with cardiologist for further refills 6 mL 0   hydrochlorothiazide (MICROZIDE) 12.5 MG capsule Take 1 capsule (12.5 mg total) by mouth daily. 30 capsule 0   lisinopril (ZESTRIL) 20 MG tablet Take 1 tablet by mouth once daily 90 tablet 0   methocarbamol (ROBAXIN) 500 MG tablet Take 1 tablet (500 mg total) by mouth every 8 (eight) hours as needed for muscle spasms. 30 tablet 1   omeprazole (PRILOSEC) 40 MG capsule Take 1 capsule (40 mg total) by mouth daily. 30 capsule 5   valACYclovir (VALTREX) 1000 MG tablet TAKE 2 TABLETS BY MOUTH AT ONSET OF COLD SORE AND REPEAT IN 12 HOURS 30 tablet 0   No facility-administered medications prior to visit.    Allergies  Allergen Reactions   Anesthetics, Amide Nausea Only   Pravastatin Sodium     REACTION: effects brain memory   Sulfadiazine     GI upset    ROS Review of Systems  Constitutional:  Negative for chills and fever.  Neurological:  Negative for weakness.     Objective:    Physical Exam Vitals reviewed.  Constitutional:      Appearance: Normal appearance.  Cardiovascular:     Rate and Rhythm: Normal rate and regular rhythm.  Musculoskeletal:     Comments: Right knee reveals full range of motion.  Mild crepitus.  No warmth.  No erythema.  No ecchymosis.  No significant effusion.  Ligament testing is normal.  Minimal medial joint line tenderness.  Neurological:     Mental Status: She is alert.    BP 140/90 (BP Location: Left Arm, Patient Position: Sitting, Cuff Size: Normal)    Pulse 85    Temp 97.7 F (36.5 C) (Oral)    Wt 175 lb 4.8 oz (79.5 kg)    SpO2 99%    BMI 33.12 kg/m  Wt Readings from Last 3 Encounters:  03/15/21 175 lb 4.8 oz (79.5 kg)   03/07/21 170 lb 12.8 oz (77.5 kg)  01/09/21 169 lb 6.4 oz (76.8 kg)     Health Maintenance Due  Topic Date Due  COLONOSCOPY (Pts 45-27yrs Insurance coverage will need to be confirmed)  Never done   DEXA SCAN  Never done   COVID-19 Vaccine (3 - Booster for Pfizer series) 01/19/2020    There are no preventive care reminders to display for this patient.  Lab Results  Component Value Date   TSH 5.86 (H) 01/09/2021   Lab Results  Component Value Date   WBC 6.8 01/09/2021   HGB 13.3 01/09/2021   HCT 40.8 01/09/2021   MCV 89.5 01/09/2021   PLT 343.0 01/09/2021   Lab Results  Component Value Date   NA 140 01/09/2021   K 4.4 01/09/2021   CO2 26 01/09/2021   GLUCOSE 84 01/09/2021   BUN 15 01/09/2021   CREATININE 0.90 01/09/2021   BILITOT 0.6 01/09/2021   ALKPHOS 70 01/09/2021   AST 21 01/09/2021   ALT 20 01/09/2021   PROT 6.6 01/09/2021   ALBUMIN 4.1 01/09/2021   CALCIUM 9.2 01/09/2021   ANIONGAP 8 12/26/2019   GFR 66.57 01/09/2021   Lab Results  Component Value Date   CHOL 133 01/09/2021   Lab Results  Component Value Date   HDL 41.60 01/09/2021   Lab Results  Component Value Date   LDLCALC 56 01/09/2021   Lab Results  Component Value Date   TRIG 177.0 (H) 01/09/2021   Lab Results  Component Value Date   CHOLHDL 3 01/09/2021   Lab Results  Component Value Date   HGBA1C 5.6 01/09/2021      Assessment & Plan:   Problem List Items Addressed This Visit   None Visit Diagnoses     Right knee pain, unspecified chronicity    -  Primary   Relevant Orders   DG Knee Complete 4 Views Right     Patient presents with approximately 66-month history of knee pain.  Did have a fall prior to this but she is also had some more chronic knee pains progressive over several months.  Suspect degenerative arthritis.  We will start with some plain x-rays of the right knee  X-ray reveals relatively mild degenerative changes especially medial compartment.  No acute  findings.  This will be over read.  We discussed 1 month trial of meloxicam 15 mg once daily.  If not improving with that consider either sports medicine referral or possible corticosteroid injection.  Also focus on quadricep strengthening exercises.  No orders of the defined types were placed in this encounter.   Follow-up: No follow-ups on file.    Carolann Littler, MD

## 2021-03-17 ENCOUNTER — Ambulatory Visit (HOSPITAL_COMMUNITY): Payer: PPO

## 2021-03-30 ENCOUNTER — Ambulatory Visit (AMBULATORY_SURGERY_CENTER): Payer: PPO | Admitting: Internal Medicine

## 2021-03-30 ENCOUNTER — Encounter: Payer: Self-pay | Admitting: Internal Medicine

## 2021-03-30 ENCOUNTER — Other Ambulatory Visit: Payer: Self-pay

## 2021-03-30 VITALS — BP 173/95 | HR 74 | Temp 96.4°F | Resp 15 | Ht 61.0 in | Wt 170.0 lb

## 2021-03-30 DIAGNOSIS — K2970 Gastritis, unspecified, without bleeding: Secondary | ICD-10-CM

## 2021-03-30 DIAGNOSIS — K219 Gastro-esophageal reflux disease without esophagitis: Secondary | ICD-10-CM | POA: Diagnosis not present

## 2021-03-30 DIAGNOSIS — I10 Essential (primary) hypertension: Secondary | ICD-10-CM | POA: Diagnosis not present

## 2021-03-30 DIAGNOSIS — K2980 Duodenitis without bleeding: Secondary | ICD-10-CM

## 2021-03-30 DIAGNOSIS — K209 Esophagitis, unspecified without bleeding: Secondary | ICD-10-CM | POA: Diagnosis not present

## 2021-03-30 DIAGNOSIS — R1310 Dysphagia, unspecified: Secondary | ICD-10-CM

## 2021-03-30 MED ORDER — SODIUM CHLORIDE 0.9 % IV SOLN: 500.0000 mL | Freq: Once | INTRAVENOUS | Status: DC

## 2021-03-30 NOTE — Op Note (Signed)
[facility] Patient Name: Debbie Werner Procedure Date: 03/30/2021 2:12 PM MRN: [id number] Endoscopist: Sonny Masters "Debbie Werner ,  Age: 66 Referring MD:  Date of Birth: [birth year] Gender: Female Account #: [id number] Procedure:                Upper GI endoscopy Indications:              Dysphagia Medicines:                Monitored Anesthesia Care Procedure:                Pre-Anesthesia Assessment:                           - Prior to the procedure, a History and Physical                            was performed, and patient medications and                            allergies were reviewed. The patient's tolerance of                            previous anesthesia was also reviewed. The risks                            and benefits of the procedure and the sedation                            options and risks were discussed with the patient.                            All questions were answered, and informed consent                            was obtained. Prior Anticoagulants: The patient has                            taken no previous anticoagulant or antiplatelet                            agents. ASA Grade Assessment: II - A patient with                            mild systemic disease. After reviewing the risks                            and benefits, the patient was deemed in                            satisfactory condition to undergo the procedure.                           After obtaining informed consent, the endoscope was  passed under direct vision. Throughout the                            procedure, the patient's blood pressure, pulse, and                            oxygen saturations were monitored continuously. The                            Endoscope was introduced through the mouth, and                            advanced to the second part of duodenum. The upper                            GI endoscopy was accomplished without  difficulty.                            The patient tolerated the procedure well. Scope In: Scope Out: Findings:                 A small non-obstructing nodule was found in the                            left pyriform sinus.                           White nummular lesions were noted in the entire                            esophagus. Biopsies were taken with a cold forceps                            for histology.                           The entire examined stomach was normal.                           An acquired mild stenosis was found in the first                            portion of the duodenum and was traversed with some                            mild resistance. Biopsies were taken with a cold                            forceps for histology. Complications:            No immediate complications. Estimated Blood Loss:     Estimated blood loss was minimal. Impression:               - A nodule was found in the left pyriform sinus.                           -  White nummular lesions in esophageal mucosa.                            Biopsied.                           - Normal stomach.                           - Acquired duodenal stenosis. Biopsied. Recommendation:           - Discharge patient to home (with escort).                           - Await pathology results.                           - Referral to ENT for assessment of nodule in the                            pyriform sinus above the cricopharyngeus.                           - Return to GI clinic as previously scheduled.                           - The findings and recommendations were discussed                            with the patient. Sonny Masters "Debbie Werner,  03/30/2021 3:34:59 PM

## 2021-03-30 NOTE — Progress Notes (Signed)
1510 Robinul 0.1 mg IV given due large amount of secretions upon assessment.  MD made aware, vss

## 2021-03-30 NOTE — Progress Notes (Signed)
Called to room to assist during endoscopic procedure.  Patient ID and intended procedure confirmed with present staff. Received instructions for my participation in the procedure from the performing physician.  

## 2021-03-30 NOTE — Progress Notes (Signed)
GASTROENTEROLOGY PROCEDURE H&P NOTE   Primary Care Physician: Eulas Post, MD    Reason for Procedure:   Dysphagia  Plan:    EGD  Patient is appropriate for endoscopic procedure(s) in the ambulatory ([place]) setting.  The nature of the procedure, as well as the risks, benefits, and alternatives were carefully and thoroughly reviewed with the patient. Ample time for discussion and questions allowed. The patient understood, was satisfied, and agreed to proceed.     HPI: Debbie Werner is a 66 y.o. female who presents for EGD for evaluation of dysphagia .  Patient was most recently seen in the Gastroenterology Clinic on 03/07/21.  No interval change in medical history since that appointment. Please refer to that note for full details regarding GI history and clinical presentation.   Past Medical History:  Diagnosis Date   ALLERGIC RHINITIS 08/03/2008   Basal cell carcinoma    nose   Cancer (HCC)    Skin - has had "spots" removed for years   CHEST PAIN, ATYPICAL 10/12/2008   CONSTIPATION, CHRONIC 12/08/2009   COVID    DEPRESSION 12/08/2009   GERD 08/03/2008   Gestational diabetes    HEMATOCHEZIA 05/02/2009   HYPERLIPIDEMIA 08/03/2008   HYPERTENSION 08/03/2008   Obesity    SINUSITIS, CHRONIC 02/24/2009    Past Surgical History:  Procedure Laterality Date   BREAST SURGERY     bx   BREATH TEK H PYLORI  12/21/2011   Procedure: BREATH TEK H PYLORI;  Surgeon: Pedro Earls, MD;  Location: [facility];  Service: General;  Laterality: N/A;  Katy/Andrea   BUNIONECTOMY Left    CYSTECTOMY  1996   [place]   METATARSAL OSTEOTOMY Left    NEURECTOMY FOOT Left    TUBAL LIGATION      Prior to Admission medications   Medication Sig Start Date End Date Taking? Authorizing Provider  aspirin 81 MG EC tablet Take 1 tablet (81 mg total) by mouth daily. Swallow whole. 03/10/21  Yes Burchette, Alinda Sierras, MD  Evolocumab (REPATHA SURECLICK) 315 MG/ML SOAJ Inject 1 pen into the skin  every 14 (fourteen) days. Please schedule an appointment with cardiologist for further refills 03/14/21  Yes Turner, Eber Hong, MD  lisinopril (ZESTRIL) 20 MG tablet Take 1 tablet by mouth once daily 03/13/21  Yes Burchette, Alinda Sierras, MD  meloxicam (MOBIC) 15 MG tablet Take 1 tablet (15 mg total) by mouth daily. 03/15/21  Yes Burchette, Alinda Sierras, MD  omeprazole (PRILOSEC) 40 MG capsule Take 1 capsule (40 mg total) by mouth daily. 03/07/21  Yes Sharyn Creamer, MD  Diclofenac Sodium 3 % GEL Place 1 application onto the skin 4 (four) times daily as needed. Patient not taking: Reported on 03/30/2021 04/10/16   Eulas Post, MD  hydrochlorothiazide (MICROZIDE) 12.5 MG capsule Take 1 capsule (12.5 mg total) by mouth daily. Patient not taking: Reported on 03/30/2021 12/28/19   Little Ishikawa, MD  methocarbamol (ROBAXIN) 500 MG tablet Take 1 tablet (500 mg total) by mouth every 8 (eight) hours as needed for muscle spasms. Patient not taking: Reported on 03/30/2021 01/09/21   Eulas Post, MD  valACYclovir (VALTREX) 1000 MG tablet TAKE 2 TABLETS BY MOUTH AT ONSET OF COLD SORE AND REPEAT IN 12 HOURS Patient not taking: Reported on 03/30/2021 01/09/21   Eulas Post, MD    Current Outpatient Medications  Medication Sig Dispense Refill   aspirin 81 MG EC tablet Take 1 tablet (81 mg total) by  mouth daily. Swallow whole. 30 tablet 11   Evolocumab (REPATHA SURECLICK) 585 MG/ML SOAJ Inject 1 pen into the skin every 14 (fourteen) days. Please schedule an appointment with cardiologist for further refills 6 mL 0   lisinopril (ZESTRIL) 20 MG tablet Take 1 tablet by mouth once daily 90 tablet 0   meloxicam (MOBIC) 15 MG tablet Take 1 tablet (15 mg total) by mouth daily. 30 tablet 0   omeprazole (PRILOSEC) 40 MG capsule Take 1 capsule (40 mg total) by mouth daily. 30 capsule 5   Diclofenac Sodium 3 % GEL Place 1 application onto the skin 4 (four) times daily as needed. (Patient not taking: Reported on 03/30/2021)  100 g 5   hydrochlorothiazide (MICROZIDE) 12.5 MG capsule Take 1 capsule (12.5 mg total) by mouth daily. (Patient not taking: Reported on 03/30/2021) 30 capsule 0   methocarbamol (ROBAXIN) 500 MG tablet Take 1 tablet (500 mg total) by mouth every 8 (eight) hours as needed for muscle spasms. (Patient not taking: Reported on 03/30/2021) 30 tablet 1   valACYclovir (VALTREX) 1000 MG tablet TAKE 2 TABLETS BY MOUTH AT ONSET OF COLD SORE AND REPEAT IN 12 HOURS (Patient not taking: Reported on 03/30/2021) 30 tablet 0   Current Facility-Administered Medications  Medication Dose Route Frequency Provider Last Rate Last Admin   0.9 %  sodium chloride infusion  500 mL Intravenous Once Sharyn Creamer, MD        Allergies as of 03/30/2021 - Review Complete 03/30/2021  Allergen Reaction Noted   Anesthetics, amide Nausea Only 12/07/2011   Pravastatin sodium  10/12/2008   Sulfadiazine  08/30/2008    Family History  Problem Relation Age of Onset   Heart disease Father 65       CABG   Hyperlipidemia Neg Hx        parent, grandparent   Hypertension Neg Hx        parent , grandparent   Stroke Neg Hx        grandparent   Diabetes Neg Hx        grandparent    Social History   Socioeconomic History   Marital status: Married    Spouse name: Not on file   Number of children: Not on file   Years of education: Not on file   Highest education level: Not on file  Occupational History   Not on file  Tobacco Use   Smoking status: Never   Smokeless tobacco: Never  Vaping Use   Vaping Use: Never used  Substance and Sexual Activity   Alcohol use: No    Alcohol/week: 0.0 standard drinks   Drug use: No   Sexual activity: Not on file  Other Topics Concern   Not on file  Social History Narrative   Not on file   Social Determinants of Health   Financial Resource Strain: Low Risk    Difficulty of Paying Living Expenses: Not hard at all  Food Insecurity: No Food Insecurity   Worried About Sales executive in the Last Year: Never true   Ran Out of Food in the Last Year: Never true  Transportation Needs: No Transportation Needs   Lack of Transportation (Medical): No   Lack of Transportation (Non-Medical): No  Physical Activity: Insufficiently Active   Days of Exercise per Week: 3 days   Minutes of Exercise per Session: 30 min  Stress: No Stress Concern Present   Feeling of Stress : Not at all  Social Connections:  Moderately Integrated   Frequency of Communication with Friends and Family: More than three times a week   Frequency of Social Gatherings with Friends and Family: More than three times a week   Attends Religious Services: 1 to 4 times per year   Active Member of [employer] or Organizations: No   Attends Archivist Meetings: Never   Marital Status: Married  Human resources officer Violence: Not At Risk   Fear of Current or Ex-Partner: No   Emotionally Abused: No   Physically Abused: No   Sexually Abused: No    Physical Exam: Vital signs in last 24 hours: BP (!) 180/90    Pulse 72    Temp (!) 96.4 F (35.8 C)    Ht 5\' 1"  (1.549 m)    Wt 170 lb (77.1 kg)    SpO2 99%    BMI 32.12 kg/m  GEN: NAD EYE: Sclerae anicteric ENT: MMM CV: Non-tachycardic Pulm: No increased WOB GI: Soft NEURO:  Alert & Oriented   [name], MD [facility]   03/30/2021 2:58 PM

## 2021-03-30 NOTE — Patient Instructions (Signed)
YOU HAD AN ENDOSCOPIC PROCEDURE TODAY: Refer to the procedure report and other information in the discharge instructions given to you for any specific questions about what was found during the examination. If this information does not answer your questions, please call [place] office at [phone] to clarify.   YOU SHOULD EXPECT: Some feelings of bloating in the abdomen. Passage of more gas than usual. Walking can help get rid of the air that was put into your GI tract during the procedure and reduce the bloating. If you had a lower endoscopy (such as a colonoscopy or flexible sigmoidoscopy) you may notice spotting of blood in your stool or on the toilet paper. Some abdominal soreness may be present for a day or two, also.  DIET: Your first meal following the procedure should be a light meal and then it is ok to progress to your normal diet. A half-sandwich or bowl of soup is an example of a good first meal. Heavy or fried foods are harder to digest and may make you feel nauseous or bloated. Drink plenty of fluids but you should avoid alcoholic beverages for 24 hours. If you had a esophageal dilation, please see attached instructions for diet.    ACTIVITY: Your care partner should take you home directly after the procedure. You should plan to take it easy, moving slowly for the rest of the day. You can resume normal activity the day after the procedure however YOU SHOULD NOT DRIVE, use power tools, machinery or perform tasks that involve climbing or major physical exertion for 24 hours (because of the sedation medicines used during the test).   SYMPTOMS TO REPORT IMMEDIATELY: A gastroenterologist can be reached at any hour. Please call [phone]  for any of the following symptoms:   Following upper endoscopy (EGD, EUS, ERCP, esophageal dilation) Vomiting of blood or coffee ground material  New, significant abdominal pain  New, significant chest pain or pain under the shoulder blades  Painful or  persistently difficult swallowing  New shortness of breath  Black, tarry-looking or red, bloody stools  FOLLOW UP:  If any biopsies were taken you will be contacted by phone or by letter within the next 1-3 weeks. Call [phone]  if you have not heard about the biopsies in 3 weeks.  Please also call with any specific questions about appointments or follow up tests.  

## 2021-03-30 NOTE — Progress Notes (Signed)
Report given to PACU, vss 

## 2021-03-31 ENCOUNTER — Other Ambulatory Visit: Payer: Self-pay

## 2021-03-31 DIAGNOSIS — J3489 Other specified disorders of nose and nasal sinuses: Secondary | ICD-10-CM

## 2021-04-03 ENCOUNTER — Telehealth: Payer: Self-pay

## 2021-04-03 NOTE — Telephone Encounter (Signed)
°  Follow up Call-  Call back number 03/30/2021  Post procedure Call Back phone  # [phone]  Permission to leave phone message Yes  Some recent data might be hidden     Patient questions:  Do you have a fever, pain , or abdominal swelling? No. Pain Score  0 *  Have you tolerated food without any problems? Yes.    Have you been able to return to your normal activities? Yes.    Do you have any questions about your discharge instructions: Diet   No. Medications  No. Follow up visit  No.  Do you have questions or concerns about your Care? No.  Actions: * If pain score is 4 or above: No action needed, pain <4.

## 2021-04-04 ENCOUNTER — Encounter: Payer: Self-pay | Admitting: Internal Medicine

## 2021-04-04 ENCOUNTER — Other Ambulatory Visit: Payer: Self-pay

## 2021-04-04 DIAGNOSIS — B379 Candidiasis, unspecified: Secondary | ICD-10-CM

## 2021-04-04 MED ORDER — FLUCONAZOLE 100 MG PO TABS: ORAL_TABLET | ORAL | 0 refills | Status: AC

## 2021-04-04 NOTE — Progress Notes (Signed)
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 Please have her take fluconazole 400 mg x 1 dose, followed by 200 mg QD for 13 days. Thanks!

## 2021-04-10 ENCOUNTER — Other Ambulatory Visit: Payer: Self-pay

## 2021-04-10 NOTE — Progress Notes (Signed)
Error

## 2021-04-21 ENCOUNTER — Ambulatory Visit: Payer: PPO | Admitting: Internal Medicine

## 2021-04-24 ENCOUNTER — Telehealth (INDEPENDENT_AMBULATORY_CARE_PROVIDER_SITE_OTHER): Payer: PPO | Admitting: Family Medicine

## 2021-04-24 ENCOUNTER — Encounter: Payer: Self-pay | Admitting: Family Medicine

## 2021-04-24 DIAGNOSIS — J040 Acute laryngitis: Secondary | ICD-10-CM

## 2021-04-24 DIAGNOSIS — R0982 Postnasal drip: Secondary | ICD-10-CM | POA: Diagnosis not present

## 2021-04-24 DIAGNOSIS — J069 Acute upper respiratory infection, unspecified: Secondary | ICD-10-CM

## 2021-04-24 NOTE — Progress Notes (Signed)
Virtual Visit via Video Note  I connected with Debbie Werner on 04/24/21 at 11:45 AM EST by a video enabled telemedicine application 2/2 [id number] pandemic and verified that I am speaking with the correct person using two identifiers.  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 The patient expressed understanding and agreed to proceed.   HPI: Pt is a 66 year old female with past medical history significant for allergies, history of skin cancer, GERD, history of depression, HLD, HTN who is followed by Dr. Elease Hashimoto and seen for acute concern.  Patient with intermittent subjective fever, lost voice Saturday. Mild sore throat, cough, pressure in head, nasal congestion, post nasal drainage.  Denies n/v, diarrhea, ear pain/pressure, facial pain/pressure, sick contacts.  Took zyrtec yesterday.  Had a negative COVID test Saturday.    ROS: See pertinent positives and negatives per HPI.  Past Medical History:  Diagnosis Date   ALLERGIC RHINITIS 08/03/2008   Basal cell carcinoma    nose   Cancer (HCC)    Skin - has had "spots" removed for years   CHEST PAIN, ATYPICAL 10/12/2008   CONSTIPATION, CHRONIC 12/08/2009   COVID    DEPRESSION 12/08/2009   GERD 08/03/2008   Gestational diabetes    HEMATOCHEZIA 05/02/2009   HYPERLIPIDEMIA 08/03/2008   HYPERTENSION 08/03/2008   Obesity    SINUSITIS, CHRONIC 02/24/2009    Past Surgical History:  Procedure Laterality Date   BREAST SURGERY     bx   BREATH TEK H PYLORI  12/21/2011   Procedure: BREATH TEK H PYLORI;  Surgeon: Pedro Earls, MD;  Location: [facility];  Service: General;  Laterality: N/A;  Katy/Andrea   BUNIONECTOMY Left    [place]ECTOMY  1996   [place]   METATARSAL OSTEOTOMY Left    NEURECTOMY FOOT Left    TUBAL LIGATION       Family History  Problem Relation Age of Onset   Heart disease Father 78       CABG   Hyperlipidemia Neg Hx        parent, grandparent   Hypertension Neg Hx        parent , grandparent   Stroke Neg Hx        grandparent   Diabetes Neg Hx        grandparent     Current Outpatient Medications:    aspirin 81 MG EC tablet, Take 1 tablet (81 mg total) by mouth daily. Swallow whole., Disp: 30 tablet, Rfl: 11   Evolocumab (REPATHA SURECLICK) 027 MG/ML SOAJ, Inject 1 pen into the skin every 14 (fourteen) days. Please schedule an appointment with cardiologist for further refills, Disp: 6 mL, Rfl: 0   lisinopril (ZESTRIL) 20 MG tablet, Take 1 tablet by mouth once daily, Disp: 90 tablet, Rfl: 0   meloxicam (MOBIC) 15 MG tablet, Take 1 tablet (15 mg total) by mouth daily., Disp: 30 tablet, Rfl: 0   omeprazole (PRILOSEC) 40 MG capsule, Take 1 capsule (40 mg total) by mouth daily., Disp: 30 capsule, Rfl: 5   Diclofenac Sodium 3 % GEL, Place 1 application onto the skin 4 (four) times daily as needed. (Patient not taking: Reported on 03/30/2021), Disp: 100 g, Rfl: 5   hydrochlorothiazide (MICROZIDE) 12.5 MG capsule, Take 1 capsule (12.5 mg total) by mouth daily. (Patient not taking: Reported on 03/30/2021), Disp: 30 capsule, Rfl:  0   methocarbamol (ROBAXIN) 500 MG tablet, Take 1 tablet (500 mg total) by mouth every 8 (eight) hours as needed for muscle spasms. (Patient not taking: Reported on 03/30/2021), Disp: 30 tablet, Rfl: 1   valACYclovir (VALTREX) 1000 MG tablet, TAKE 2 TABLETS BY MOUTH AT ONSET OF COLD SORE AND REPEAT IN 12 HOURS (Patient not taking: Reported on 03/30/2021), Disp: 30 tablet, Rfl: 0  EXAM:  VITALS per patient if applicable: RR between 56-43 bpm  GENERAL: alert, oriented, appears well and in no acute distress.  Hoarse voice, barely audible.  HEENT: atraumatic, conjunctiva clear, no obvious abnormalities on inspection of external nose and ears  NECK: normal movements of the head  and neck  LUNGS: on inspection no signs of respiratory distress, breathing rate appears normal, no obvious gross SOB, gasping or wheezing  CV: no obvious cyanosis  MS: moves all visible extremities without noticeable abnormality  PSYCH/NEURO: pleasant and cooperative, no obvious depression or anxiety, speech and thought processing grossly intact  ASSESSMENT AND PLAN:  Discussed the following assessment and plan:  Laryngitis -Advised possible caused by viral etiology or postnasal drip 2/2 allergic rhinitis -Supportive care including OTC antihistamine/nasal spray to reduce postnasal drainage, warm fluids, honey, Chloraseptic spray or gargling with warm salt water -Vocal rest advised -Continue to monitor  Viral URI with cough -Negative COVD testing 04/22/2021 -Treatment of symptoms with over-the-counter cough/cold medications, gargling with warm salt water or Chloraseptic spray, antihistamine, cough drops, rest, hydration etc. -Advised abx not indicated at this time. -Given precautions  Postnasal drainage -OTC antihistamine/saline nasal rinse or Flonase nasal spray  Follow-up as needed  I discussed the assessment and treatment plan with the patient. The patient was provided an opportunity to ask questions and all were answered. The patient agreed with the plan and demonstrated an understanding of the instructions.   The patient was advised to call back or seek an in-person evaluation if the symptoms worsen or if the condition fails to improve as anticipated.   [name], MD

## 2021-04-25 DIAGNOSIS — Z03818 Encounter for observation for suspected exposure to other biological agents ruled out: Secondary | ICD-10-CM | POA: Diagnosis not present

## 2021-04-25 DIAGNOSIS — Z1152 Encounter for screening for COVID-19: Secondary | ICD-10-CM | POA: Diagnosis not present

## 2021-04-25 DIAGNOSIS — R051 Acute cough: Secondary | ICD-10-CM | POA: Diagnosis not present

## 2021-04-25 DIAGNOSIS — J00 Acute nasopharyngitis [common cold]: Secondary | ICD-10-CM | POA: Diagnosis not present

## 2021-06-16 ENCOUNTER — Other Ambulatory Visit: Payer: Self-pay | Admitting: Cardiology

## 2021-06-23 ENCOUNTER — Telehealth: Payer: Self-pay | Admitting: Family Medicine

## 2021-06-23 NOTE — Telephone Encounter (Signed)
Left message for patient to call back and schedule Medicare Annual Wellness Visit (AWV) either virtually or in office. Left  my [name] number [phone] ? ? ?Last AWV ;06/23/20 ?please schedule at anytime with [facility] Nurse Health Advisor 1 or 2 ? ? ? ?

## 2021-06-26 ENCOUNTER — Other Ambulatory Visit: Payer: Self-pay | Admitting: Family Medicine

## 2021-06-26 VITALS — Ht 61.0 in | Wt 168.0 lb

## 2021-06-26 DIAGNOSIS — Z0000 Encounter for general adult medical examination without abnormal findings: Secondary | ICD-10-CM

## 2021-06-26 NOTE — Progress Notes (Signed)
This encounter was created in error - please disregard.

## 2021-06-27 ENCOUNTER — Encounter: Payer: Self-pay | Admitting: Cardiology

## 2021-06-27 NOTE — Telephone Encounter (Signed)
Called and got pt rescheduled for 07/20/21  ?

## 2021-06-29 ENCOUNTER — Ambulatory Visit: Payer: PPO | Admitting: Cardiology

## 2021-07-20 ENCOUNTER — Ambulatory Visit: Payer: PPO | Admitting: Cardiology

## 2021-08-10 ENCOUNTER — Telehealth: Payer: Self-pay | Admitting: Family Medicine

## 2021-08-10 NOTE — Telephone Encounter (Signed)
Left message for patient to call back and schedule Medicare Annual Wellness Visit (AWV) either virtually or in office. Left  my [name] number [phone]   Last AWV ;06/23/20 please schedule at anytime with [facility] Nurse Health Advisor 1 or 2

## 2021-09-04 ENCOUNTER — Other Ambulatory Visit: Payer: Self-pay | Admitting: Internal Medicine

## 2021-09-17 ENCOUNTER — Other Ambulatory Visit: Payer: Self-pay | Admitting: Family Medicine

## 2021-09-18 ENCOUNTER — Other Ambulatory Visit: Payer: Self-pay | Admitting: Family Medicine

## 2021-10-03 ENCOUNTER — Telehealth: Payer: Self-pay | Admitting: Family Medicine

## 2021-10-03 NOTE — Telephone Encounter (Signed)
Left message for patient to call back and schedule Medicare Annual Wellness Visit (AWV) either virtually or in office. Left  my [name] number [phone]   Last AWV 06/23/20 ; please schedule at anytime with [facility] Nurse Health Advisor 1 or 2   Tuesday 10:13 AM] Ramond Craver whenever you get a chance  no hurry  can you look at Debbie Werner , [birth year] MRN:  [id number]  she is arrived for her 06/26/21  but I dont see any notes.  I just want to make sure before I call her that she was a no show [Tuesday 10:47 AM] Glenna Durand I don't think so. When I look back at the expand in appointments I see where I removed arrival. I don't know what happened, but I really do not think it was done.

## 2021-10-09 ENCOUNTER — Ambulatory Visit (INDEPENDENT_AMBULATORY_CARE_PROVIDER_SITE_OTHER): Payer: PPO | Admitting: Family Medicine

## 2021-10-09 ENCOUNTER — Encounter: Payer: Self-pay | Admitting: Family Medicine

## 2021-10-09 VITALS — BP 126/76 | HR 71 | Temp 97.3°F | Ht 61.0 in | Wt 166.2 lb

## 2021-10-09 DIAGNOSIS — I10 Essential (primary) hypertension: Secondary | ICD-10-CM

## 2021-10-09 DIAGNOSIS — M25551 Pain in right hip: Secondary | ICD-10-CM | POA: Diagnosis not present

## 2021-10-09 MED ORDER — METHOCARBAMOL 500 MG PO TABS: 500.0000 mg | ORAL_TABLET | Freq: Three times a day (TID) | ORAL | 1 refills | Status: DC | PRN

## 2021-10-09 MED ORDER — ALPRAZOLAM 0.25 MG PO TABS: 0.2500 mg | ORAL_TABLET | Freq: Two times a day (BID) | ORAL | 0 refills | Status: DC | PRN

## 2021-10-09 NOTE — Progress Notes (Signed)
Established Patient Office Visit  Subjective   Patient ID: Debbie Werner, female    DOB: April 01, 1955  Age: 66 y.o. MRN: [id number]  Chief Complaint  Patient presents with   Hypertension   Knee Pain    Patinet complains of right knee pain, x1 year   Hip Pain    Patient complains of hip pain,    HPI   Debbie Werner is here today with complaints as above.  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  She estimates that she works 70 to 80 hours/week during the summer.  She had a day last Friday where she had blood pressure reading 160/110.  She felt somewhat dizzy and slightly nauseous without vomiting.  Poor sleep quality recently.  She thinks a lot of her blood pressure was stress related.  She went home and took a low-dose alprazolam and after 2 hours blood pressure was back in 120s some range systolic.  She felt better overall.  She has had some recent muscle tension and tightness which she thinks is also related to stress.  Has benefited with Robaxin in the past.  She has had some vague pains almost more right anterior hip and groin region recently.  Occasional left-sided pain.  No restricted range of motion of the hip.  No stiffness.  No injury.  No radiculitis symptoms.  Right anterior hip pain and groin pain is very inconsistent.  Sometimes worse with walking and prolonged standing  Past Medical History:  Diagnosis Date   ALLERGIC RHINITIS 08/03/2008   Basal cell carcinoma    nose   Cancer (HCC)    Skin - has had "spots" removed for years   CHEST PAIN, ATYPICAL 10/12/2008   CONSTIPATION, CHRONIC 12/08/2009   COVID    DEPRESSION 12/08/2009   GERD 08/03/2008   Gestational diabetes    HEMATOCHEZIA 05/02/2009   HYPERLIPIDEMIA 08/03/2008   HYPERTENSION 08/03/2008   Obesity    SINUSITIS, CHRONIC 02/24/2009   Past Surgical History:  Procedure Laterality Date   BREAST SURGERY     bx   BREATH TEK H PYLORI  12/21/2011   Procedure: BREATH TEK H PYLORI;   Surgeon: Pedro Earls, MD;  Location: [facility];  Service: General;  Laterality: N/A;  Katy/Andrea   BUNIONECTOMY Left    [place]STECTOMY  1996   [place]   METATARSAL OSTEOTOMY Left    NEURECTOMY FOOT Left    TUBAL LIGATION      reports that she has never smoked. She has never used smokeless tobacco. She reports that she does not drink alcohol and does not use drugs. family history includes Heart disease (age of onset: 65) in her father. Allergies  Allergen Reactions   Anesthetics, Amide Nausea Only   Pravastatin Sodium     REACTION: effects brain memory   Sulfadiazine     GI upset    Review of Systems  Constitutional:  Negative for chills, fever, malaise/fatigue and weight loss.  Eyes:  Negative for blurred vision.  Respiratory:  Negative for shortness of breath.   Cardiovascular:  Negative for chest pain.  Neurological:  Negative for dizziness, weakness and headaches.      Objective:     BP 126/76 (BP Location: Left Arm, Patient Position: Sitting, Cuff Size: Normal)   Pulse 71   Temp (!) 97.3 F (36.3 C) (Oral)   Ht '5\' 1"'$  (1.549 m)   Wt 166 lb 3.2 oz (75.4 kg)   SpO2 95%  BMI 31.40 kg/m    Physical Exam Vitals reviewed.  Constitutional:      Appearance: She is well-developed.  Eyes:     Pupils: Pupils are equal, round, and reactive to light.  Neck:     Thyroid: No thyromegaly.     Vascular: No JVD.  Cardiovascular:     Rate and Rhythm: Normal rate and regular rhythm.     Heart sounds:     No gallop.  Pulmonary:     Effort: Pulmonary effort is normal. No respiratory distress.     Breath sounds: Normal breath sounds. No wheezing or rales.  Musculoskeletal:     Cervical back: Neck supple.     Comments: No significant lower extremity edema.  Excellent range of motion right hip.  No localized tenderness.  No pain with hip flexion against resistance.  Neurological:     Mental Status: She is alert.      No results found for any visits on  10/09/21.    The 10-year ASCVD risk score (Arnett DK, et al., 2019) is: 8.2%    Assessment & Plan:   #1 hypertension.  Recent spurious elevation possibly stress related.  Much improved today.  She is also attempting to lose weight which should help further.  Continue lisinopril.  Watch sodium intake.  Continue monitoring and be in touch if she continues to have any further spikes  #2 right anterior hip pain.  Doubt osteoarthritis.  She has excellent range of motion right hip.  Pain symptoms are very inconsistent.  Currently pain-free.  Observe for now.  Consider sports medicine referral if persist  No follow-ups on file.    Carolann Littler, MD

## 2021-10-12 ENCOUNTER — Telehealth: Payer: Self-pay

## 2021-10-12 NOTE — Telephone Encounter (Signed)
Unsuccessful attempt to reach patient on preferred number listed in notes for scheduled AWV. Left message on voicemail okay to reschedule. 

## 2021-10-18 ENCOUNTER — Telehealth: Payer: Self-pay | Admitting: Family Medicine

## 2021-10-18 NOTE — Telephone Encounter (Signed)
Spoke with patient to r/s her 8/24 no show appt.  Patient declined  and prefer not to schedule Do not call

## 2021-11-15 DIAGNOSIS — H2513 Age-related nuclear cataract, bilateral: Secondary | ICD-10-CM | POA: Diagnosis not present

## 2021-11-15 DIAGNOSIS — H40033 Anatomical narrow angle, bilateral: Secondary | ICD-10-CM | POA: Diagnosis not present

## 2022-01-13 ENCOUNTER — Other Ambulatory Visit: Payer: Self-pay | Admitting: Family Medicine

## 2022-01-15 NOTE — Telephone Encounter (Signed)
Patient calling to check on progress of ths refill

## 2022-01-30 ENCOUNTER — Other Ambulatory Visit: Payer: Self-pay | Admitting: Family Medicine

## 2022-01-30 DIAGNOSIS — M25551 Pain in right hip: Secondary | ICD-10-CM

## 2022-01-30 NOTE — Telephone Encounter (Signed)
Requests refill meloxicam (MOBIC) 15 MG tablet  [place], [place] Phone: [phone]  Fax: [phone]

## 2022-01-31 MED ORDER — MELOXICAM 15 MG PO TABS: 15.0000 mg | ORAL_TABLET | Freq: Every day | ORAL | 0 refills | Status: DC

## 2022-01-31 NOTE — Telephone Encounter (Signed)
Rx sent and patient scheduled for labs

## 2022-02-01 ENCOUNTER — Other Ambulatory Visit: Payer: PPO

## 2022-02-01 DIAGNOSIS — M25551 Pain in right hip: Secondary | ICD-10-CM

## 2022-02-01 DIAGNOSIS — Z1231 Encounter for screening mammogram for malignant neoplasm of breast: Secondary | ICD-10-CM | POA: Diagnosis not present

## 2022-02-01 LAB — HM MAMMOGRAPHY

## 2022-02-02 LAB — HEPATIC FUNCTION PANEL
ALT: 19 U/L (ref 0–35)
AST: 21 U/L (ref 0–37)
Albumin: 3.9 g/dL (ref 3.5–5.2)
Alkaline Phosphatase: 69 U/L (ref 39–117)
Bilirubin, Direct: 0.1 mg/dL (ref 0.0–0.3)
Total Bilirubin: 0.4 mg/dL (ref 0.2–1.2)
Total Protein: 6.3 g/dL (ref 6.0–8.3)

## 2022-02-02 NOTE — Telephone Encounter (Signed)
Orders placed.

## 2022-02-13 ENCOUNTER — Encounter: Payer: Self-pay | Admitting: Family Medicine

## 2022-03-01 ENCOUNTER — Other Ambulatory Visit: Payer: Self-pay | Admitting: Family Medicine

## 2022-03-05 ENCOUNTER — Telehealth (INDEPENDENT_AMBULATORY_CARE_PROVIDER_SITE_OTHER): Payer: PPO | Admitting: Family Medicine

## 2022-03-05 ENCOUNTER — Other Ambulatory Visit: Payer: Self-pay | Admitting: Family Medicine

## 2022-03-05 ENCOUNTER — Encounter: Payer: Self-pay | Admitting: Family Medicine

## 2022-03-05 VITALS — Temp 98.6°F | Ht 61.0 in | Wt 160.0 lb

## 2022-03-05 DIAGNOSIS — U071 COVID-19: Secondary | ICD-10-CM | POA: Diagnosis not present

## 2022-03-05 MED ORDER — NIRMATRELVIR/RITONAVIR (PAXLOVID)TABLET: 3.0000 | ORAL_TABLET | Freq: Two times a day (BID) | ORAL | 0 refills | Status: DC

## 2022-03-05 NOTE — Telephone Encounter (Signed)
Rx done. 

## 2022-03-05 NOTE — Progress Notes (Signed)
Subjective:    Patient ID: Debbie Werner, female    DOB: [birth year], 67 y.o.   MRN: [id number]  HPI Virtual Visit via Video Note  I connected with the patient on 03/05/22 at  3:30 PM EST by a video enabled telemedicine application and verified that I am speaking with the correct person using two identifiers.  Location patient: home Location provider:work or home office Persons participating in the virtual visit: patient, provider  I discussed the limitations of evaluation and management by telemedicine and the availability of in person appointments. The patient expressed understanding and agreed to proceed.   HPI: Here for 3 days of low grade fevers, a dry cough, and body aches. No SOB or NVD. Taking Nyquil and Tylenol. She tested positive for the Covid virus yesterday.    ROS: See pertinent positives and negatives per HPI.  Past Medical History:  Diagnosis Date   ALLERGIC RHINITIS 08/03/2008   Basal cell carcinoma    nose   Cancer (HCC)    Skin - has had "spots" removed for years   CHEST PAIN, ATYPICAL 10/12/2008   CONSTIPATION, CHRONIC 12/08/2009   COVID    DEPRESSION 12/08/2009   GERD 08/03/2008   Gestational diabetes    HEMATOCHEZIA 05/02/2009   HYPERLIPIDEMIA 08/03/2008   HYPERTENSION 08/03/2008   Obesity    SINUSITIS, CHRONIC 02/24/2009    Past Surgical History:  Procedure Laterality Date   BREAST SURGERY     bx   BREATH TEK H PYLORI  12/21/2011   Procedure: BREATH TEK H PYLORI;  Surgeon: Pedro Earls, MD;  Location: [facility];  Service: General;  Laterality: N/A;  Katy/Andrea   BUNIONECTOMY Left    CYSTECTOMY  1996   [place]   METATARSAL OSTEOTOMY Left    NEURECTOMY FOOT Left    TUBAL LIGATION      Family History  Problem Relation Age of Onset   Heart disease Father 84       CABG   Hyperlipidemia Neg Hx        parent, grandparent   Hypertension Neg Hx        parent , grandparent   Stroke Neg Hx        grandparent   Diabetes Neg Hx         grandparent     Current Outpatient Medications:    ALPRAZolam (XANAX) 0.25 MG tablet, Take 1 tablet (0.25 mg total) by mouth 2 (two) times daily as needed for anxiety., Disp: 20 tablet, Rfl: 0   aspirin 81 MG EC tablet, Take 1 tablet (81 mg total) by mouth daily. Swallow whole., Disp: 30 tablet, Rfl: 11   Diclofenac Sodium 3 % GEL, Place 1 application onto the skin 4 (four) times daily as needed., Disp: 100 g, Rfl: 5   Evolocumab (REPATHA SURECLICK) 754 MG/ML SOAJ, INJECT 1 PEN ('140MG'$  TOTAL) SUBCUTANEOUSLY EVERY 14 DAYS. PLEASE SCHEDULE AN APPOINTMENT WITH CARDIOLOGIST FOR FUTURE REFILLS, Disp: 6 mL, Rfl: 3   lisinopril (ZESTRIL) 20 MG tablet, Take 1 tablet by mouth once daily, Disp: 90 tablet, Rfl: 0   meloxicam (MOBIC) 15 MG tablet, Take 1 tablet by mouth once daily, Disp: 30 tablet, Rfl: 0   methocarbamol (ROBAXIN) 500 MG tablet, Take 1 tablet (500 mg total) by mouth every 8 (eight) hours as needed for muscle spasms., Disp: 30 tablet, Rfl: 1   NAPROXEN PO, Take by mouth., Disp: , Rfl:    nirmatrelvir/ritonavir (PAXLOVID) 20 x 150 MG & 10 x '100MG'$   TABS, Take 3 tablets by mouth 2 (two) times daily for 5 days. (Take nirmatrelvir 150 mg two tablets twice daily for 5 days and ritonavir 100 mg one tablet twice daily for 5 days) Patient GFR is 67, Disp: 30 tablet, Rfl: 0   omeprazole (PRILOSEC) 40 MG capsule, Take 1 capsule by mouth once daily, Disp: 30 capsule, Rfl: 6   valACYclovir (VALTREX) 1000 MG tablet, TAKE 2 TABLETS BY MOUTH AT ONSET OF COLD SORE AND REPEAT IN 12 HOURS, Disp: 30 tablet, Rfl: 0  EXAM:  VITALS per patient if applicable:  GENERAL: alert, oriented, appears well and in no acute distress  HEENT: atraumatic, conjunttiva clear, no obvious abnormalities on inspection of external nose and ears  NECK: normal movements of the head and neck  LUNGS: on inspection no signs of respiratory distress, breathing rate appears normal, no obvious gross SOB, gasping or wheezing  CV: no  obvious cyanosis  MS: moves all visible extremities without noticeable abnormality  PSYCH/NEURO: pleasant and cooperative, no obvious depression or anxiety, speech and thought processing grossly intact  ASSESSMENT AND PLAN: Covid infection. Treat with 5 days of Paxlovid.  Alysia Penna, MD  Discussed the following assessment and plan:  No diagnosis found.     I discussed the assessment and treatment plan with the patient. The patient was provided an opportunity to ask questions and all were answered. The patient agreed with the plan and demonstrated an understanding of the instructions.   The patient was advised to call back or seek an in-person evaluation if the symptoms worsen or if the condition fails to improve as anticipated.      Review of Systems     Objective:   Physical Exam        Assessment & Plan:

## 2022-03-06 ENCOUNTER — Telehealth: Payer: Self-pay | Admitting: Family Medicine

## 2022-03-06 MED ORDER — MOLNUPIRAVIR EUA 200MG CAPSULE: 4.0000 | ORAL_CAPSULE | Freq: Two times a day (BID) | ORAL | 0 refills | Status: AC

## 2022-03-06 NOTE — Telephone Encounter (Signed)
Pt would like a call back to see what medication MD gave her last year for Covid?  Pt states that medication did not make her BP go up.  Pt states she has been waiting all day for the answer.  Please call Pt back at your earliest convenience.

## 2022-03-06 NOTE — Telephone Encounter (Signed)
Debbie Werner the Triage nurse call and stated 2 hour after she took her paxlovid her BP with up to 160/110 also sharp chest pains but didn't have to take her nitro today pt BP is 120/90 pt want a call back

## 2022-03-06 NOTE — Telephone Encounter (Signed)
Pt is calling and took nirmatrelvir/ritonavir (PAXLOVID)  yesterday and her bp went up to 170/110 and about 2 hrs left had chest pain. Pt BP today its 124/90 she is not having any headaches. Pt states she does not have bp problem . Pt had virtual with dr fry. 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 Pt was transfer to triage nurse

## 2022-03-06 NOTE — Telephone Encounter (Signed)
---  caller states she has covid. took paxlovid sunday and 2 hours after she had chest pain that went up into her head. bp was high 160/110. she took her lisinopril. bp stayed up all night and 5am this morning was 154/94 and then at 8:30 was 124/90 and she only took the one dose of paxlovid. she is wanting to know what she took last year when she had covid.  03/06/2022 9:14:36 AM Go to ED Now [place], RN, Estill Bamberg  Comments User: Rozelle Logan, RN Date/Time Eilene Ghazi Time): 03/06/2022 9:11:53 AM had appointment with Dr. Sharlene Motts yesterday for covid. started paxlovid yesterday not sunday.  User: Rozelle Logan, RN Date/Time Eilene Ghazi Time): 03/06/2022 9:15:00 AM chest pain lasted for seconds. did not have to take nitro.  Referrals GO TO FACILITY UNDECIDED

## 2022-03-06 NOTE — Addendum Note (Signed)
Addended by: Alysia Penna A on: 03/06/2022 05:18 PM   Modules accepted: Orders

## 2022-03-06 NOTE — Telephone Encounter (Signed)
Last year she was given Molnupiravir, so I have cancelled the Paxlovid and I sent in a 5 day supply of Molnupiravir. Follow up as needed.

## 2022-03-07 NOTE — Telephone Encounter (Signed)
Spoke with pt stated that she picked up Rx and is taking, states that she feels better than before.

## 2022-03-15 DIAGNOSIS — X32XXXD Exposure to sunlight, subsequent encounter: Secondary | ICD-10-CM | POA: Diagnosis not present

## 2022-03-15 DIAGNOSIS — L570 Actinic keratosis: Secondary | ICD-10-CM | POA: Diagnosis not present

## 2022-03-15 DIAGNOSIS — D225 Melanocytic nevi of trunk: Secondary | ICD-10-CM | POA: Diagnosis not present

## 2022-03-15 DIAGNOSIS — Z08 Encounter for follow-up examination after completed treatment for malignant neoplasm: Secondary | ICD-10-CM | POA: Diagnosis not present

## 2022-03-15 DIAGNOSIS — Z1283 Encounter for screening for malignant neoplasm of skin: Secondary | ICD-10-CM | POA: Diagnosis not present

## 2022-03-15 DIAGNOSIS — K130 Diseases of lips: Secondary | ICD-10-CM | POA: Diagnosis not present

## 2022-03-15 DIAGNOSIS — Z85820 Personal history of malignant melanoma of skin: Secondary | ICD-10-CM | POA: Diagnosis not present

## 2022-04-02 ENCOUNTER — Encounter: Payer: Self-pay | Admitting: Family Medicine

## 2022-04-02 ENCOUNTER — Ambulatory Visit (INDEPENDENT_AMBULATORY_CARE_PROVIDER_SITE_OTHER): Payer: PPO | Admitting: Family Medicine

## 2022-04-02 VITALS — BP 130/80 | HR 75 | Temp 97.9°F | Ht 61.0 in | Wt 172.8 lb

## 2022-04-02 DIAGNOSIS — I10 Essential (primary) hypertension: Secondary | ICD-10-CM | POA: Diagnosis not present

## 2022-04-02 DIAGNOSIS — K219 Gastro-esophageal reflux disease without esophagitis: Secondary | ICD-10-CM | POA: Diagnosis not present

## 2022-04-02 DIAGNOSIS — R059 Cough, unspecified: Secondary | ICD-10-CM | POA: Diagnosis not present

## 2022-04-02 DIAGNOSIS — E7800 Pure hypercholesterolemia, unspecified: Secondary | ICD-10-CM

## 2022-04-02 LAB — POCT INFLUENZA A/B
Influenza A, POC: NEGATIVE
Influenza B, POC: NEGATIVE

## 2022-04-02 LAB — POC COVID19 BINAXNOW: SARS Coronavirus 2 Ag: NEGATIVE

## 2022-04-02 NOTE — Progress Notes (Signed)
Established Patient Office Visit  Subjective   Patient ID: Debbie Werner, female    DOB: [birth year]  Age: 67 y.o. MRN: [id number]  Chief Complaint  Patient presents with   Cough   Back Pain    HPI   Debbie Werner is seen today with increased cough for the past 4 days or so.  She recalls prior to onset of cough she ate some popcorn which was fairly heavy with butter right before going to bed.  About 3 or 4 hours later she woke up with obvious burning sensation in her esophagus and noticed some increased hoarseness and onset of coughing the next day.  No fever.  She takes omeprazole 40 mg daily at baseline.  No dysphagia.  No hemoptysis.  No chills.  She has hypertension treated with lisinopril 20 mg daily.  No chronic cough issues.  Takes Repatha.  Has not had lipids since 2022.  She had nuclear stress test 7/22 which came back fine.  No signs of ischemia.  Past Medical History:  Diagnosis Date   ALLERGIC RHINITIS 08/03/2008   Basal cell carcinoma    nose   Cancer (HCC)    Skin - has had "spots" removed for years   CHEST PAIN, ATYPICAL 10/12/2008   CONSTIPATION, CHRONIC 12/08/2009   COVID    DEPRESSION 12/08/2009   GERD 08/03/2008   Gestational diabetes    HEMATOCHEZIA 05/02/2009   HYPERLIPIDEMIA 08/03/2008   HYPERTENSION 08/03/2008   Obesity    SINUSITIS, CHRONIC 02/24/2009   Past Surgical History:  Procedure Laterality Date   BREAST SURGERY     bx   BREATH TEK H PYLORI  12/21/2011   Procedure: BREATH TEK H PYLORI;  Surgeon: Pedro Earls, MD;  Location: [facility];  Service: General;  Laterality: N/A;  Katy/Andrea   BUNIONECTOMY Left    CYSTECTOMY  1996   [place]   METATARSAL OSTEOTOMY Left    NEURECTOMY FOOT Left    TUBAL LIGATION      reports that she has never smoked. She has never used smokeless tobacco. She reports that she does not drink alcohol and does not use drugs. family history includes Heart disease (age of onset: 56) in her father. Allergies  Allergen  Reactions   Anesthetics, Amide Nausea Only   Pravastatin Sodium     REACTION: effects brain memory   Sulfadiazine     GI upset    Review of Systems  Constitutional:  Negative for chills, fever and malaise/fatigue.  Eyes:  Negative for blurred vision.  Respiratory:  Positive for cough. Negative for shortness of breath.   Cardiovascular:  Negative for leg swelling.  Gastrointestinal:  Positive for heartburn.  Neurological:  Negative for dizziness, weakness and headaches.      Objective:     BP 130/80 (BP Location: Left Arm, Patient Position: Sitting, Cuff Size: Large)   Pulse 75   Temp 97.9 F (36.6 C) (Oral)   Ht 5' 1"$  (1.549 m)   Wt 172 lb 12.8 oz (78.4 kg)   SpO2 98%   BMI 32.65 kg/m    Physical Exam Vitals reviewed.  Cardiovascular:     Rate and Rhythm: Normal rate and regular rhythm.  Pulmonary:     Effort: Pulmonary effort is normal.     Breath sounds: Normal breath sounds. No wheezing or rales.  Musculoskeletal:     Right lower leg: No edema.     Left lower leg: No edema.  Neurological:     Mental  Status: She is alert.      Results for orders placed or performed in visit on 04/02/22  POC Influenza A/B  Result Value Ref Range   Influenza A, POC Negative Negative   Influenza B, POC Negative Negative  POC COVID-19  Result Value Ref Range   SARS Coronavirus 2 Ag Negative Negative      The 10-year ASCVD risk score (Arnett DK, et al., 2019) is: 8.7%    Assessment & Plan:   Problem List Items Addressed This Visit       High   Hyperlipidemia   Relevant Orders   Lipid panel   Essential hypertension   Relevant Orders   Basic metabolic panel   Other Visit Diagnoses     Cough, unspecified type    -  Primary   Relevant Orders   POC Influenza A/B (Completed)   POC COVID-19 (Completed)   CBC with Differential/Platelet     Patient presents with cough over the past few days.  This seemed to be worse after episode of obvious reflux.  Question is  whether she may have aspirated but she has had no fever and normal lung exam at this time with no respiratory distress. -Follow-up immediately for any fever or increased shortness of breath -Her influenza and COVID testing were negative. -Continue omeprazole 40 mg daily and consider supplement with nighttime Pepcid 20 mg for the next several days -Be in touch if hoarseness not resolving over the next couple of weeks  -She is overdue for lipids.  She was nonfasting today.  Future lab order placed for lipid panel  -She has hypertension which is stable on lisinopril.  Check basic metabolic panel  No follow-ups on file.    Carolann Littler, MD

## 2022-04-02 NOTE — Patient Instructions (Addendum)
Consider adding Pepcid 20 mg at night  Follow up for any fever or increased shortness of breath.  Avoid eating within 2-3 hours of bedtime.

## 2022-04-03 ENCOUNTER — Other Ambulatory Visit (INDEPENDENT_AMBULATORY_CARE_PROVIDER_SITE_OTHER): Payer: PPO

## 2022-04-03 DIAGNOSIS — E7800 Pure hypercholesterolemia, unspecified: Secondary | ICD-10-CM | POA: Diagnosis not present

## 2022-04-03 DIAGNOSIS — I10 Essential (primary) hypertension: Secondary | ICD-10-CM | POA: Diagnosis not present

## 2022-04-03 DIAGNOSIS — R059 Cough, unspecified: Secondary | ICD-10-CM | POA: Diagnosis not present

## 2022-04-03 LAB — CBC WITH DIFFERENTIAL/PLATELET
Basophils Absolute: 0.1 10*3/uL (ref 0.0–0.1)
Basophils Relative: 0.9 % (ref 0.0–3.0)
Eosinophils Absolute: 0.3 10*3/uL (ref 0.0–0.7)
Eosinophils Relative: 4.9 % (ref 0.0–5.0)
HCT: 41.2 % (ref 36.0–46.0)
Hemoglobin: 13.6 g/dL (ref 12.0–15.0)
Lymphocytes Relative: 26.5 % (ref 12.0–46.0)
Lymphs Abs: 1.9 10*3/uL (ref 0.7–4.0)
MCHC: 33 g/dL (ref 30.0–36.0)
MCV: 88.1 fl (ref 78.0–100.0)
Monocytes Absolute: 0.5 10*3/uL (ref 0.1–1.0)
Monocytes Relative: 7.7 % (ref 3.0–12.0)
Neutro Abs: 4.2 10*3/uL (ref 1.4–7.7)
Neutrophils Relative %: 60 % (ref 43.0–77.0)
Platelets: 348 10*3/uL (ref 150.0–400.0)
RBC: 4.67 Mil/uL (ref 3.87–5.11)
RDW: 12.9 % (ref 11.5–15.5)
WBC: 7 10*3/uL (ref 4.0–10.5)

## 2022-04-03 LAB — LIPID PANEL
Cholesterol: 111 mg/dL (ref 0–200)
HDL: 38.5 mg/dL — ABNORMAL LOW (ref >39.00)
LDL Cholesterol: 43 mg/dL (ref 0–99)
NonHDL: 72.87
Total CHOL/HDL Ratio: 3
Triglycerides: 147 mg/dL (ref 0.0–149.0)
VLDL: 29.4 mg/dL (ref 0.0–40.0)

## 2022-04-03 LAB — BASIC METABOLIC PANEL
BUN: 19 mg/dL (ref 6–23)
CO2: 30 mEq/L (ref 19–32)
Calcium: 9.5 mg/dL (ref 8.4–10.5)
Chloride: 105 mEq/L (ref 96–112)
Creatinine, Ser: 1.12 mg/dL (ref 0.40–1.20)
GFR: 50.77 mL/min — ABNORMAL LOW (ref >60.00)
Glucose, Bld: 82 mg/dL (ref 70–99)
Potassium: 4.3 mEq/L (ref 3.5–5.1)
Sodium: 140 mEq/L (ref 135–145)

## 2022-04-10 ENCOUNTER — Telehealth: Payer: Self-pay | Admitting: Family Medicine

## 2022-04-10 NOTE — Telephone Encounter (Signed)
Pt call and stated she want to know is their anything dr.Burchette can call her in since he don't want her to take the [place]. Pt stated she want a call back.

## 2022-04-11 ENCOUNTER — Other Ambulatory Visit: Payer: Self-pay | Admitting: Family Medicine

## 2022-04-11 ENCOUNTER — Ambulatory Visit: Payer: PPO | Admitting: Family Medicine

## 2022-04-11 NOTE — Telephone Encounter (Signed)
Patient informed of the message below and verbalized understanding. Oatient complains of elevated blood pressure today with reading around 180/120 and 177/119. Patient has been scheduled for visit today with PCP to evaluate

## 2022-04-11 NOTE — Telephone Encounter (Signed)
Patient denies any chest pain, tightness or shortness of breath

## 2022-04-13 ENCOUNTER — Other Ambulatory Visit: Payer: Self-pay | Admitting: Family Medicine

## 2022-04-26 NOTE — Telephone Encounter (Signed)
error 

## 2022-05-19 ENCOUNTER — Emergency Department (HOSPITAL_BASED_OUTPATIENT_CLINIC_OR_DEPARTMENT_OTHER): Payer: PPO

## 2022-05-19 ENCOUNTER — Emergency Department (HOSPITAL_BASED_OUTPATIENT_CLINIC_OR_DEPARTMENT_OTHER)
Admission: EM | Admit: 2022-05-19 | Discharge: 2022-05-19 | Disposition: A | Discharge status: Home / Self Care | Payer: PPO | Attending: Emergency Medicine | Admitting: Emergency Medicine

## 2022-05-19 ENCOUNTER — Encounter (HOSPITAL_BASED_OUTPATIENT_CLINIC_OR_DEPARTMENT_OTHER): Payer: Self-pay | Admitting: Emergency Medicine

## 2022-05-19 ENCOUNTER — Other Ambulatory Visit: Payer: Self-pay

## 2022-05-19 DIAGNOSIS — Z79899 Other long term (current) drug therapy: Secondary | ICD-10-CM | POA: Insufficient documentation

## 2022-05-19 DIAGNOSIS — K529 Noninfective gastroenteritis and colitis, unspecified: Secondary | ICD-10-CM | POA: Insufficient documentation

## 2022-05-19 DIAGNOSIS — I700 Atherosclerosis of aorta: Secondary | ICD-10-CM | POA: Diagnosis not present

## 2022-05-19 DIAGNOSIS — R1110 Vomiting, unspecified: Secondary | ICD-10-CM | POA: Diagnosis not present

## 2022-05-19 DIAGNOSIS — R109 Unspecified abdominal pain: Secondary | ICD-10-CM | POA: Diagnosis not present

## 2022-05-19 DIAGNOSIS — Z1152 Encounter for screening for COVID-19: Secondary | ICD-10-CM | POA: Insufficient documentation

## 2022-05-19 DIAGNOSIS — Z7982 Long term (current) use of aspirin: Secondary | ICD-10-CM | POA: Diagnosis not present

## 2022-05-19 DIAGNOSIS — I10 Essential (primary) hypertension: Secondary | ICD-10-CM | POA: Diagnosis not present

## 2022-05-19 DIAGNOSIS — E860 Dehydration: Secondary | ICD-10-CM | POA: Insufficient documentation

## 2022-05-19 DIAGNOSIS — R112 Nausea with vomiting, unspecified: Secondary | ICD-10-CM | POA: Diagnosis present

## 2022-05-19 LAB — CBC WITH DIFFERENTIAL/PLATELET
Abs Immature Granulocytes: 0.02 10*3/uL (ref 0.00–0.07)
Basophils Absolute: 0.1 10*3/uL (ref 0.0–0.1)
Basophils Relative: 1 %
Eosinophils Absolute: 0.1 10*3/uL (ref 0.0–0.5)
Eosinophils Relative: 1 %
HCT: 45.6 % (ref 36.0–46.0)
Hemoglobin: 14.8 g/dL (ref 12.0–15.0)
Immature Granulocytes: 0 %
Lymphocytes Relative: 23 %
Lymphs Abs: 1.8 10*3/uL (ref 0.7–4.0)
MCH: 28.6 pg (ref 26.0–34.0)
MCHC: 32.5 g/dL (ref 30.0–36.0)
MCV: 88 fL (ref 80.0–100.0)
Monocytes Absolute: 0.9 10*3/uL (ref 0.1–1.0)
Monocytes Relative: 12 %
Neutro Abs: 4.9 10*3/uL (ref 1.7–7.7)
Neutrophils Relative %: 63 %
Platelets: 362 10*3/uL (ref 150–400)
RBC: 5.18 MIL/uL — ABNORMAL HIGH (ref 3.87–5.11)
RDW: 13.2 % (ref 11.5–15.5)
WBC: 7.8 10*3/uL (ref 4.0–10.5)
nRBC: 0 % (ref 0.0–0.2)

## 2022-05-19 LAB — URINALYSIS, ROUTINE W REFLEX MICROSCOPIC
Bilirubin Urine: NEGATIVE
Glucose, UA: NEGATIVE mg/dL
Hgb urine dipstick: NEGATIVE
Ketones, ur: NEGATIVE mg/dL
Leukocytes,Ua: NEGATIVE
Nitrite: NEGATIVE
Protein, ur: NEGATIVE mg/dL
Specific Gravity, Urine: 1.043 — ABNORMAL HIGH (ref 1.005–1.030)
pH: 6 (ref 5.0–8.0)

## 2022-05-19 LAB — COMPREHENSIVE METABOLIC PANEL
ALT: 28 U/L (ref 0–44)
AST: 19 U/L (ref 15–41)
Albumin: 4.3 g/dL (ref 3.5–5.0)
Alkaline Phosphatase: 82 U/L (ref 38–126)
Anion gap: 11 (ref 5–15)
BUN: 26 mg/dL — ABNORMAL HIGH (ref 8–23)
CO2: 22 mmol/L (ref 22–32)
Calcium: 9.6 mg/dL (ref 8.9–10.3)
Chloride: 105 mmol/L (ref 98–111)
Creatinine, Ser: 1.43 mg/dL — ABNORMAL HIGH (ref 0.44–1.00)
GFR, Estimated: 40 mL/min — ABNORMAL LOW (ref >60)
Glucose, Bld: 110 mg/dL — ABNORMAL HIGH (ref 70–99)
Potassium: 3.6 mmol/L (ref 3.5–5.1)
Sodium: 138 mmol/L (ref 135–145)
Total Bilirubin: 0.7 mg/dL (ref 0.3–1.2)
Total Protein: 7.2 g/dL (ref 6.5–8.1)

## 2022-05-19 LAB — RESP PANEL BY RT-PCR (RSV, FLU A&B, COVID)  RVPGX2
Influenza A by PCR: NEGATIVE
Influenza B by PCR: NEGATIVE
Resp Syncytial Virus by PCR: NEGATIVE
SARS Coronavirus 2 by RT PCR: NEGATIVE

## 2022-05-19 LAB — LIPASE, BLOOD: Lipase: 27 U/L (ref 11–51)

## 2022-05-19 MED ORDER — LACTATED RINGERS IV BOLUS
1000.0000 mL | Freq: Once | INTRAVENOUS | Status: AC
  Administered 2022-05-19: 1000 mL via INTRAVENOUS

## 2022-05-19 MED ORDER — LOPERAMIDE HCL 2 MG PO CAPS: 2.0000 mg | ORAL_CAPSULE | ORAL | 0 refills | Status: DC | PRN

## 2022-05-19 MED ORDER — ONDANSETRON HCL 4 MG/2ML IJ SOLN
4.0000 mg | Freq: Once | INTRAMUSCULAR | Status: AC
  Administered 2022-05-19: 4 mg via INTRAVENOUS
  Filled 2022-05-19: qty 2

## 2022-05-19 MED ORDER — LACTATED RINGERS IV BOLUS
500.0000 mL | Freq: Once | INTRAVENOUS | Status: AC
  Administered 2022-05-19: 500 mL via INTRAVENOUS

## 2022-05-19 MED ORDER — DICYCLOMINE HCL 20 MG PO TABS: 20.0000 mg | ORAL_TABLET | Freq: Two times a day (BID) | ORAL | 0 refills | Status: DC | PRN

## 2022-05-19 MED ORDER — IOHEXOL 300 MG/ML  SOLN
100.0000 mL | Freq: Once | INTRAMUSCULAR | Status: AC | PRN
  Administered 2022-05-19: 70 mL via INTRAVENOUS

## 2022-05-19 MED ORDER — ONDANSETRON 4 MG PO TBDP: 4.0000 mg | ORAL_TABLET | Freq: Three times a day (TID) | ORAL | 0 refills | Status: DC | PRN

## 2022-05-19 NOTE — ED Triage Notes (Signed)
Pt POV  from home, caox4, ambulatory. Pt reports Tuesday night she began having N/V/D, hasn't been able to eat since, and that she has had abd pain since this morning. Pt further reports "son's family was recently sick with a virus."

## 2022-05-19 NOTE — ED Provider Notes (Signed)
[name] Provider Note   CSN: [id number] Arrival date & time: 05/19/22  [id number]     History  Chief Complaint  Patient presents with   Abdominal Pain    Debbie Werner is a 67 y.o. female.  With PMH of HTN, HLD, GERD who presents with 4 to 5 days of ongoing nausea, vomiting, diarrhea and generally stomach discomfort.  Her son's family was recently sick with a viral illness last week.  She has had no fevers, no chills, no chest pain, no shortness of breath, no cough, no congestion or rhinorrhea or pain with urination or other urinary symptoms.  She has just had generalized intermittent abdominal cramping throughout with nausea and nonbloody nonbilious emesis.  Her last emesis was a couple of days ago.  Her last episode of diarrhea yesterday nonbloody.  She came in today just for generalized fatigue and decreased p.o. intake over the past week.   Abdominal Pain      Home Medications Prior to Admission medications   Medication Sig Start Date End Date Taking? Authorizing Provider  dicyclomine (BENTYL) 20 MG tablet Take 1 tablet (20 mg total) by mouth 2 (two) times daily as needed (abdominal cramping). 05/19/22  Yes Elgie Congo, MD  loperamide (IMODIUM) 2 MG capsule Take 1 capsule (2 mg total) by mouth as needed for diarrhea or loose stools. 05/19/22  Yes Elgie Congo, MD  ondansetron (ZOFRAN-ODT) 4 MG disintegrating tablet Take 1 tablet (4 mg total) by mouth every 8 (eight) hours as needed. 05/19/22  Yes Elgie Congo, MD  ALPRAZolam Duanne Moron) 0.25 MG tablet Take 1 tablet by mouth twice daily as needed for anxiety 04/12/22   Eulas Post, MD  aspirin 81 MG EC tablet Take 1 tablet (81 mg total) by mouth daily. Swallow whole. 03/10/21   Burchette, Alinda Sierras, MD  Diclofenac Sodium 3 % GEL Place 1 application onto the skin 4 (four) times daily as needed. 04/10/16   Burchette, Alinda Sierras, MD  Evolocumab (REPATHA SURECLICK) [id number] MG/ML  SOAJ INJECT 1 PEN (140MG  TOTAL) SUBCUTANEOUSLY EVERY 14 DAYS. PLEASE SCHEDULE AN APPOINTMENT WITH CARDIOLOGIST FOR FUTURE REFILLS 06/16/21   Sueanne Margarita, MD  lisinopril (ZESTRIL) 20 MG tablet Take 1 tablet by mouth once daily 04/11/22   Burchette, Alinda Sierras, MD  meloxicam (MOBIC) 15 MG tablet Take 1 tablet by mouth once daily 03/05/22   Burchette, Alinda Sierras, MD  methocarbamol (ROBAXIN) 500 MG tablet Take 1 tablet (500 mg total) by mouth every 8 (eight) hours as needed for muscle spasms. 10/09/21   Burchette, Alinda Sierras, MD  NAPROXEN PO Take by mouth.    [provider]  omeprazole (PRILOSEC) 40 MG capsule Take 1 capsule by mouth once daily 09/04/21   Sharyn Creamer, MD  valACYclovir (VALTREX) 1000 MG tablet TAKE 2 TABLETS BY MOUTH AT ONSET OF COLD SORE AND REPEAT IN 12 HOURS 09/18/21   Burchette, Alinda Sierras, MD      Allergies    Anesthetics, amide; Pravastatin sodium; and Sulfadiazine    Review of Systems   Review of Systems  Gastrointestinal:  Positive for abdominal pain.    Physical Exam Updated Vital Signs BP 135/70 (BP Location: Right Arm)   Pulse 72   Temp 98.2 F (36.8 C) (Oral)   Resp 16   Ht 5\' 1"  (1.549 m)   Wt 74.8 kg   SpO2 100%   BMI 31.18 kg/m  Physical Exam Constitutional: Alert and  oriented.  Nontoxic no acute distress Eyes: Conjunctivae are normal. ENT      Head: Normocephalic and atraumatic.      Nose: No congestion. Cardiovascular: S1, S2, regular rate and rhythm Respiratory: Normal respiratory effort. Breath sounds are normal.  O2 sat 97 on RA Gastrointestinal: Soft and no distention with mild left upper quadrant and right lower quadrant tenderness to palpation.  There is no rebound or guarding.  Abdomen is not peritonitic. Musculoskeletal: Normal range of motion in all extremities.      Right lower leg: No tenderness or edema.      Left lower leg: No tenderness or edema. Neurologic: Normal speech and language.  No facial droop.  Moving all extremities  equally.  Sensation grossly intact.  No gross focal neurologic deficits are appreciated. Skin: Skin is warm, dry and intact. No rash noted. Psychiatric: Mood and affect are normal. Speech and behavior are normal.  ED Results / Procedures / Treatments   Labs (all labs ordered are listed, but only abnormal results are displayed) Labs Reviewed  COMPREHENSIVE METABOLIC PANEL - Abnormal; Notable for the following components:      Result Value   Glucose, Bld 110 (*)    BUN 26 (*)    Creatinine, Ser 1.43 (*)    GFR, Estimated 40 (*)    All other components within normal limits  CBC WITH DIFFERENTIAL/PLATELET - Abnormal; Notable for the following components:   RBC 5.18 (*)    All other components within normal limits  URINALYSIS, ROUTINE W REFLEX MICROSCOPIC - Abnormal; Notable for the following components:   Color, Urine COLORLESS (*)    Specific Gravity, Urine 1.043 (*)    All other components within normal limits  RESP PANEL BY RT-PCR (RSV, FLU A&B, COVID)  RVPGX2  LIPASE, BLOOD    EKG None  Radiology CT ABDOMEN PELVIS W CONTRAST  Result Date: 05/19/2022 CLINICAL DATA:  Pain, vomiting and diarrhea. EXAM: CT ABDOMEN AND PELVIS WITH CONTRAST TECHNIQUE: Multidetector CT imaging of the abdomen and pelvis was performed using the standard protocol following bolus administration of intravenous contrast. RADIATION DOSE REDUCTION: This exam was performed according to the departmental dose-optimization program which includes automated exposure control, adjustment of the mA and/or kV according to patient size and/or use of iterative reconstruction technique. CONTRAST:  75mL OMNIPAQUE IOHEXOL 300 MG/ML  SOLN COMPARISON:  None Available. FINDINGS: Lower chest: No acute abnormality. Hepatobiliary: No focal liver abnormality identified. Gallbladder appears normal. No bile duct dilatation. Pancreas: Unremarkable. No pancreatic ductal dilatation or surrounding inflammatory changes. Spleen: Normal in size  without focal abnormality. Adrenals/Urinary Tract: Normal adrenal glands. No nephrolithiasis, hydronephrosis or kidney mass. Urinary bladder is unremarkable. Stomach/Bowel: Small hiatal hernia. Small diminutive appendix identified without signs of appendicitis. No bowel wall thickening, inflammation, or distension. Vascular/Lymphatic: Aortic atherosclerosis. Right lower quadrant ileocolic node is identified measuring 1.1 cm, image 53/3 no additional enlarged abdominopelvic lymph nodes. Reproductive: Uterus and bilateral adnexa are unremarkable. Other: There is no free fluid or fluid collections identified. No signs of pneumoperitoneum. No abdominal wall hernia. Musculoskeletal: Signs of osteitis pubis. No acute or suspicious osseous findings. IMPRESSION: 1. No acute findings within the abdomen or pelvis. 2. Small hiatal hernia. 3. Mildly enlarged right lower quadrant ileocolic node is identified measuring 1.1 cm. This is nonspecific and may be reactive in the setting of recent enteritis. 4. Signs of osteitis pubis. 5.  Aortic Atherosclerosis (ICD10-I70.0). Electronically Signed   By: Kerby Moors M.D.   On: 05/19/2022 11:43  Procedures Procedures    Medications Ordered in ED Medications  lactated ringers bolus 1,000 mL (0 mLs Intravenous Stopped 05/19/22 1136)  ondansetron (ZOFRAN) injection 4 mg (4 mg Intravenous Given 05/19/22 1021)  iohexol (OMNIPAQUE) 300 MG/ML solution 100 mL (70 mLs Intravenous Contrast Given 05/19/22 1120)  lactated ringers bolus 500 mL (0 mLs Intravenous Stopped 05/19/22 1313)    ED Course/ Medical Decision Making/ A&P Clinical Course as of 05/19/22 1726  Sat May 19, 2022  1108 Patient's labs reviewed by me.  She does have a mild AKI creatinine 1.43 with elevated BUN 26 likely all prerenal due to decreased p.o. intake vomiting and diarrhea.  She has a normal white blood count 7.8.  Her lipase is within normal limits.  There is no transaminitis and normal total bilirubin.   Her RVP is negative. [VB]  [id number] Patient reassessed, endorsing improvement in symptoms.  She has had no vomiting here and is able to tolerate p.o.  I advised close follow-up with her PCP for repeat BMP and creatinine check.  Advised continued p.o. hydration and strict return precautions.  She is in agreement with plan.  She has been discharged with prescriptions for Zofran, Bentyl and Imodium.  She was discharged in good condition. [VB]    Clinical Course User Index [VB] Elgie Congo, MD    Medical Decision Making  Debbie Werner is a 67 y.o. female.  With PMH of HTN, HLD, GERD who presents with 4 to 5 days of ongoing nausea, vomiting, diarrhea and generally stomach discomfort.  Differential for patient's symptoms includes but not limited to most likely viral gastroenteritis especially after recent exposure to sick contacts but also consider colitis, diverticulitis, appendicitis among multiple other etiologies.  She presents hemodynamically stable and well-appearing.  There are no focal deficits concerning for CVA or neurologic issues.  Patient's labs reviewed by me.  She does have a mild AKI creatinine 1.43 with elevated BUN 26 likely all prerenal due to decreased p.o. intake vomiting and diarrhea.  She has a normal white blood count 7.8.  Her lipase is within normal limits.  There is no transaminitis and normal total bilirubin.  Her RVP is negative. [VB]   CTAP with findings suggestive of enteritis.   Patient reassessed, endorsing improvement in symptoms.  She has had no vomiting here and is able to tolerate p.o.  I advised close follow-up with her PCP for repeat BMP and creatinine check.  Advised continued p.o. hydration and strict return precautions.  She is in agreement with plan.  She has been discharged with prescriptions for Zofran, Bentyl and Imodium.  She was discharged in good condition. [VB]     Amount and/or Complexity of Data Reviewed Labs: ordered. Radiology:  ordered.  Risk Prescription drug management.   Final Clinical Impression(s) / ED Diagnoses Final diagnoses:  Gastroenteritis  Dehydration    Rx / DC Orders ED Discharge Orders          Ordered    ondansetron (ZOFRAN-ODT) 4 MG disintegrating tablet  Every 8 hours PRN        05/19/22 1347    loperamide (IMODIUM) 2 MG capsule  As needed        05/19/22 1347    dicyclomine (BENTYL) 20 MG tablet  2 times daily PRN        05/19/22 1347              Elgie Congo, MD 05/19/22 1726

## 2022-05-19 NOTE — Discharge Instructions (Signed)
You have been seen in the Emergency Department (ED)  today for nausea and vomiting.  Your work up today has not shown a clear cause for your symptoms, but they may be due to a viral infection called gastroenteritis. You have been prescribed Zofran; please use as prescribed as needed for your nausea.  Follow up with your doctor as soon as possible, ideally within one week, regarding today's emergent visit and your symptoms of nausea/vomiting.  You will need a repeat blood work to check for your kidney function to make sure it is improving.  You were dehydrated and given IV fluids in the ER.  Return to the Emergency Department (ED)  if you develop severe abdominal pain, bloody vomiting, bloody diarrhea, if you are unable to tolerate fluids due to vomiting, or if you develop other symptoms that concern you.

## 2022-05-23 ENCOUNTER — Telehealth: Payer: Self-pay

## 2022-05-23 NOTE — Telephone Encounter (Signed)
     Patient  visit on 05/19/2022  at [facility] was for Abdominal Pain  Have you been able to follow up with your primary care physician? Patient will call this week to schedule appointment.  The patient was or was not able to obtain any needed medicine or equipment. Patient was able to obtain medication.  Are there diet recommendations that you are having difficulty following? No  Patient expresses understanding of discharge instructions and education provided has no other needs at this time. Yes   [facility] Care Guide   [email or web address]  ?? [id number]   Website: [email or web address]  [email or web address]

## 2022-05-28 ENCOUNTER — Ambulatory Visit (INDEPENDENT_AMBULATORY_CARE_PROVIDER_SITE_OTHER): Payer: PPO | Admitting: Family Medicine

## 2022-05-28 ENCOUNTER — Encounter: Payer: Self-pay | Admitting: Family Medicine

## 2022-05-28 VITALS — BP 130/84 | HR 76 | Temp 98.1°F | Wt 174.6 lb

## 2022-05-28 DIAGNOSIS — N179 Acute kidney failure, unspecified: Secondary | ICD-10-CM | POA: Diagnosis not present

## 2022-05-28 DIAGNOSIS — E7800 Pure hypercholesterolemia, unspecified: Secondary | ICD-10-CM

## 2022-05-28 MED ORDER — REPATHA SURECLICK 140 MG/ML ~~LOC~~ SOAJ: SUBCUTANEOUS | 0 refills | Status: DC

## 2022-05-28 NOTE — Progress Notes (Signed)
   Established Patient Office Visit   Subjective  Patient ID: Debbie Werner, female    DOB: February 04, 1956  Age: 67 y.o. MRN: [id number]  Chief Complaint  Patient presents with   Follow-up    ED    Patient is a 67 year old female followed by Dr. Caryl Werner and here for follow-up.  Seen on 05/19/2022 in ED for viral gastroenteritis.  AKI noted due to dehydration, patient advised to follow-up with PCP for recheck.  Patient notes improvement in symptoms.  Drinking Pedialyte and some water, ~40 oz/d.  Patient requesting refill on Repatha.      ROS Negative unless stated above    Objective:     BP 130/84 (BP Location: Right Arm, Patient Position: Sitting, Cuff Size: Normal)   Pulse 76   Temp 98.1 F (36.7 C) (Oral)   Wt 174 lb 9.6 oz (79.2 kg)   SpO2 99%   BMI 32.99 kg/m    Physical Exam Constitutional:      General: She is not in acute distress.    Appearance: Normal appearance.  HENT:     Head: Normocephalic and atraumatic.     Nose: Nose normal.     Mouth/Throat:     Mouth: Mucous membranes are moist.  Cardiovascular:     Rate and Rhythm: Normal rate and regular rhythm.     Heart sounds: Normal heart sounds. No murmur heard.    No gallop.  Pulmonary:     Effort: Pulmonary effort is normal. No respiratory distress.     Breath sounds: Normal breath sounds. No wheezing, rhonchi or rales.  Skin:    General: Skin is warm and dry.  Neurological:     Mental Status: She is alert and oriented to person, place, and time.      No results found for any visits on 05/28/22.    Assessment & Plan:  AKI (acute kidney injury) -Creatinine 1.43, GFR 40 on 05/19/22. -increase hydration -     Basic metabolic panel  Pure hypercholesterolemia -limited refill provided in pcp's absence. -Per previous Rx and advised to follow-up with cardiology for future refills. -     Repatha SureClick; INJECT 1 PEN (140MG  TOTAL) SUBCUTANEOUSLY EVERY 14 DAYS. PLEASE SCHEDULE AN APPOINTMENT WITH  CARDIOLOGIST FOR FUTURE REFILLS  Dispense: 2 mL; Refill: 0   Return if symptoms worsen or fail to improve.   Debbie Saint, MD

## 2022-05-28 NOTE — Patient Instructions (Signed)
Refill of Repatha sent to pharmacy.  The previous prescription advised that you needed to follow-up with your cardiologist for further refills.  Orders were placed for labs to monitor your kidney function.

## 2022-05-29 LAB — BASIC METABOLIC PANEL
BUN: 11 mg/dL (ref 6–23)
CO2: 25 mEq/L (ref 19–32)
Calcium: 8.9 mg/dL (ref 8.4–10.5)
Chloride: 108 mEq/L (ref 96–112)
Creatinine, Ser: 0.89 mg/dL (ref 0.40–1.20)
GFR: 66.82 mL/min (ref >60.00)
Glucose, Bld: 89 mg/dL (ref 70–99)
Potassium: 3.9 mEq/L (ref 3.5–5.1)
Sodium: 142 mEq/L (ref 135–145)

## 2022-06-11 ENCOUNTER — Ambulatory Visit (INDEPENDENT_AMBULATORY_CARE_PROVIDER_SITE_OTHER): Payer: PPO | Admitting: Family Medicine

## 2022-06-11 ENCOUNTER — Encounter: Payer: Self-pay | Admitting: Family Medicine

## 2022-06-11 VITALS — BP 130/86 | HR 73 | Temp 97.7°F | Ht 61.0 in | Wt 176.2 lb

## 2022-06-11 DIAGNOSIS — E669 Obesity, unspecified: Secondary | ICD-10-CM | POA: Diagnosis not present

## 2022-06-11 MED ORDER — SEMAGLUTIDE(0.25 OR 0.5MG/DOS) 2 MG/1.5ML ~~LOC~~ SOPN: 0.2500 mg | PEN_INJECTOR | SUBCUTANEOUS | 0 refills | Status: DC

## 2022-06-11 MED ORDER — ONDANSETRON 4 MG PO TBDP: 4.0000 mg | ORAL_TABLET | Freq: Three times a day (TID) | ORAL | 0 refills | Status: DC | PRN

## 2022-06-11 NOTE — Progress Notes (Signed)
   Subjective:    Patient ID: Debbie Werner, female    DOB: Oct 12, 1955, 67 y.o.   MRN: [id number]  HPI Here asking for help with weight loss. She goes to [employer] in [place], [place], and they have a promotional program where she can try Semaglutide for $300 a month. She exercises and eats a healthy diet.    Review of Systems  Constitutional: Negative.   Respiratory: Negative.    Cardiovascular: Negative.        Objective:   Physical Exam Constitutional:      Appearance: She is obese.  Cardiovascular:     Rate and Rhythm: Normal rate and regular rhythm.     Pulses: Normal pulses.     Heart sounds: Normal heart sounds.  Pulmonary:     Effort: Pulmonary effort is normal.     Breath sounds: Normal breath sounds.  Neurological:     Mental Status: She is alert.           Assessment & Plan:  Obesity. She will try 4 weeks of Semaglutide 0.25 mg weekly, then she will report back to [place].  Gershon Crane, MD

## 2022-06-17 ENCOUNTER — Other Ambulatory Visit: Payer: Self-pay | Admitting: Internal Medicine

## 2022-07-05 ENCOUNTER — Encounter: Payer: Self-pay | Admitting: Family Medicine

## 2022-07-05 ENCOUNTER — Other Ambulatory Visit: Payer: Self-pay

## 2022-07-05 NOTE — Telephone Encounter (Signed)
Please advise 

## 2022-07-06 ENCOUNTER — Other Ambulatory Visit: Payer: Self-pay | Admitting: Family Medicine

## 2022-07-06 MED ORDER — SEMAGLUTIDE(0.25 OR 0.5MG/DOS) 2 MG/1.5ML ~~LOC~~ SOPN: 0.5000 mg | PEN_INJECTOR | SUBCUTANEOUS | 0 refills | Status: DC

## 2022-07-06 NOTE — Telephone Encounter (Signed)
I sent in to take 0.5 mg weekly

## 2022-07-10 ENCOUNTER — Other Ambulatory Visit: Payer: Self-pay

## 2022-07-10 NOTE — Telephone Encounter (Signed)
Pt called to FU and is requesting a call back.

## 2022-07-10 NOTE — Telephone Encounter (Signed)
Pt pharmacy states that Rx should be written as Semaglutide compound injectable. Please advise

## 2022-07-10 NOTE — Telephone Encounter (Signed)
The RX I sent in was for Semaglutide (I did not order Ozempic)

## 2022-07-11 ENCOUNTER — Ambulatory Visit (INDEPENDENT_AMBULATORY_CARE_PROVIDER_SITE_OTHER): Payer: PPO | Admitting: Family Medicine

## 2022-07-11 ENCOUNTER — Encounter: Payer: Self-pay | Admitting: Family Medicine

## 2022-07-11 VITALS — BP 118/80 | Temp 97.8°F | Wt 164.2 lb

## 2022-07-11 DIAGNOSIS — E669 Obesity, unspecified: Secondary | ICD-10-CM | POA: Diagnosis not present

## 2022-07-11 NOTE — Progress Notes (Signed)
   Subjective:    Patient ID: Debbie Werner, female    DOB: [birth year], 67 y.o.   MRN: [id number]  HPI Here to follow up after starting on weekly shots of Semaglutide. She has tolerated this well, and she has lost 12 lbs. She asks to try the next higher dose.    Review of Systems  Constitutional: Negative.   Respiratory: Negative.    Cardiovascular: Negative.   Gastrointestinal: Negative.        Objective:   Physical Exam Constitutional:      Appearance: Normal appearance.  Cardiovascular:     Rate and Rhythm: Normal rate and regular rhythm.     Pulses: Normal pulses.     Heart sounds: Normal heart sounds.  Pulmonary:     Effort: Pulmonary effort is normal.     Breath sounds: Normal breath sounds.  Neurological:     Mental Status: She is alert.           Assessment & Plan:  Obesity, we will increase the Semaglutide shots to 0.5 mg weekly. I signed a RX form from her pharmacy to participate in their reduced price program. Gershon Crane, MD

## 2022-07-11 NOTE — Telephone Encounter (Signed)
Pt  had an office visit with Dr Clent Ridges regarding this message, nothing further needed

## 2022-07-17 ENCOUNTER — Other Ambulatory Visit: Payer: Self-pay | Admitting: Internal Medicine

## 2022-10-02 ENCOUNTER — Other Ambulatory Visit: Payer: Self-pay | Admitting: Family Medicine

## 2022-10-02 DIAGNOSIS — E7800 Pure hypercholesterolemia, unspecified: Secondary | ICD-10-CM

## 2022-10-04 MED ORDER — REPATHA SURECLICK 140 MG/ML ~~LOC~~ SOAJ: SUBCUTANEOUS | 5 refills | Status: DC

## 2022-10-04 NOTE — Telephone Encounter (Signed)
Rx done. 

## 2022-10-19 ENCOUNTER — Other Ambulatory Visit: Payer: Self-pay | Admitting: Internal Medicine

## 2022-10-26 DIAGNOSIS — H2513 Age-related nuclear cataract, bilateral: Secondary | ICD-10-CM | POA: Diagnosis not present

## 2022-10-26 DIAGNOSIS — H40033 Anatomical narrow angle, bilateral: Secondary | ICD-10-CM | POA: Diagnosis not present

## 2022-12-11 ENCOUNTER — Other Ambulatory Visit: Payer: Self-pay | Admitting: Family Medicine

## 2022-12-30 ENCOUNTER — Other Ambulatory Visit: Payer: Self-pay | Admitting: Family Medicine

## 2023-01-04 ENCOUNTER — Emergency Department (HOSPITAL_BASED_OUTPATIENT_CLINIC_OR_DEPARTMENT_OTHER)
Admission: EM | Admit: 2023-01-04 | Discharge: 2023-01-04 | Disposition: A | Discharge status: Home / Self Care | Payer: PPO

## 2023-01-04 ENCOUNTER — Other Ambulatory Visit: Payer: Self-pay

## 2023-01-04 ENCOUNTER — Emergency Department (HOSPITAL_BASED_OUTPATIENT_CLINIC_OR_DEPARTMENT_OTHER): Payer: PPO | Admitting: Radiology

## 2023-01-04 DIAGNOSIS — S161XXA Strain of muscle, fascia and tendon at neck level, initial encounter: Secondary | ICD-10-CM | POA: Diagnosis not present

## 2023-01-04 DIAGNOSIS — R0789 Other chest pain: Secondary | ICD-10-CM | POA: Diagnosis not present

## 2023-01-04 DIAGNOSIS — Z79899 Other long term (current) drug therapy: Secondary | ICD-10-CM | POA: Diagnosis not present

## 2023-01-04 DIAGNOSIS — Z7982 Long term (current) use of aspirin: Secondary | ICD-10-CM | POA: Insufficient documentation

## 2023-01-04 DIAGNOSIS — X58XXXA Exposure to other specified factors, initial encounter: Secondary | ICD-10-CM | POA: Diagnosis not present

## 2023-01-04 DIAGNOSIS — R079 Chest pain, unspecified: Secondary | ICD-10-CM | POA: Diagnosis not present

## 2023-01-04 DIAGNOSIS — Z859 Personal history of malignant neoplasm, unspecified: Secondary | ICD-10-CM | POA: Diagnosis not present

## 2023-01-04 DIAGNOSIS — R944 Abnormal results of kidney function studies: Secondary | ICD-10-CM | POA: Diagnosis not present

## 2023-01-04 DIAGNOSIS — M542 Cervicalgia: Secondary | ICD-10-CM | POA: Diagnosis not present

## 2023-01-04 DIAGNOSIS — I10 Essential (primary) hypertension: Secondary | ICD-10-CM | POA: Insufficient documentation

## 2023-01-04 DIAGNOSIS — S199XXA Unspecified injury of neck, initial encounter: Secondary | ICD-10-CM | POA: Diagnosis present

## 2023-01-04 LAB — CBC
HCT: 42.5 % (ref 36.0–46.0)
Hemoglobin: 14.1 g/dL (ref 12.0–15.0)
MCH: 29.4 pg (ref 26.0–34.0)
MCHC: 33.2 g/dL (ref 30.0–36.0)
MCV: 88.5 fL (ref 80.0–100.0)
Platelets: 346 10*3/uL (ref 150–400)
RBC: 4.8 MIL/uL (ref 3.87–5.11)
RDW: 13.2 % (ref 11.5–15.5)
WBC: 9.1 10*3/uL (ref 4.0–10.5)
nRBC: 0 % (ref 0.0–0.2)

## 2023-01-04 LAB — BASIC METABOLIC PANEL
Anion gap: 7 (ref 5–15)
BUN: 17 mg/dL (ref 8–23)
CO2: 31 mmol/L (ref 22–32)
Calcium: 9.6 mg/dL (ref 8.9–10.3)
Chloride: 104 mmol/L (ref 98–111)
Creatinine, Ser: 1.12 mg/dL — ABNORMAL HIGH (ref 0.44–1.00)
GFR, Estimated: 54 mL/min — ABNORMAL LOW (ref >60)
Glucose, Bld: 90 mg/dL (ref 70–99)
Potassium: 4.1 mmol/L (ref 3.5–5.1)
Sodium: 142 mmol/L (ref 135–145)

## 2023-01-04 LAB — TROPONIN I (HIGH SENSITIVITY): Troponin I (High Sensitivity): 3 ng/L (ref <18)

## 2023-01-04 MED ORDER — DICLOFENAC SODIUM 50 MG PO TBEC: 50.0000 mg | DELAYED_RELEASE_TABLET | Freq: Three times a day (TID) | ORAL | 0 refills | Status: DC

## 2023-01-04 MED ORDER — METHOCARBAMOL 500 MG PO TABS: 500.0000 mg | ORAL_TABLET | Freq: Two times a day (BID) | ORAL | 0 refills | Status: DC

## 2023-01-04 NOTE — ED Provider Notes (Signed)
[place] [facility]ERGENC[facility] DEPARTMENT AT [facility] Provider Note   CSN: [id number] Arrival date & time: 01/04/23  1712     History  Chief Complaint  Patient presents with   Chest Pain    Debbie Werner is a 67 y.o. female with past medical history significant for hyperlipidemia, hypertension, cancer, GERD who presents to the ED complaining of right sided chest pain that began 3 days ago.  She reports that the pain is worse when she takes a deep breath or when she presses on her chest wall.  She also has right sided neck pain and tightness.  Patient reports that she has been on a tractor and blowing leaves lately.  Patient states when she is on the tractor, she has to look over her shoulder for most of the time.  Denies palpitations, chest tightness, shortness of breath, cough, dizziness, headache, lightheadedness, fever.       Home Medications Prior to Admission medications   Medication Sig Start Date End Date Taking? Authorizing Provider  diclofenac (VOLTAREN) 50 MG EC tablet Take 1 tablet (50 mg total) by mouth 3 (three) times daily. 01/04/23  Yes Zaara Sprowl R, PA-C  methocarbamol (ROBAXIN) 500 MG tablet Take 1 tablet (500 mg total) by mouth 2 (two) times daily. 01/04/23  Yes Reginia Battie R, PA-C  ALPRAZolam Prudy Feeler) 0.25 MG tablet Take 1 tablet by mouth twice daily as needed for anxiety 04/12/22   Kristian Covey, MD  aspirin 81 MG EC tablet Take 1 tablet (81 mg total) by mouth daily. Swallow whole. 03/10/21   Burchette, Elberta Fortis, MD  Diclofenac Sodium 3 % GEL Place 1 application onto the skin 4 (four) times daily as needed. 04/10/16   Burchette, Elberta Fortis, MD  Evolocumab (REPATHA SURECLICK) 140 MG/ML SOAJ INJECT 1 PEN (140MG  TOTAL) SUBCUTANEOUSLY EVERY 14 DAYS. PLEASE SCHEDULE AN APPOINTMENT WITH CARDIOLOGIST FOR FUTURE REFILLS 10/04/22   Burchette, Elberta Fortis, MD  lisinopril (ZESTRIL) 20 MG tablet Take 1 tablet by mouth once daily 01/01/23   Burchette, Elberta Fortis, MD  omeprazole  (PRILOSEC) 40 MG capsule Take 1 capsule by mouth once daily 10/19/22   Imogene Burn, MD  ondansetron (ZOFRAN-ODT) 4 MG disintegrating tablet Take 1 tablet (4 mg total) by mouth every 8 (eight) hours as needed for nausea or vomiting. 06/11/22   Nelwyn Salisbury, MD  Semaglutide,0.25 or 0.5MG /DOS, 2 MG/1.5ML SOPN Inject 0.5 mg into the skin once a week. 07/06/22   Nelwyn Salisbury, MD  valACYclovir (VALTREX) 1000 MG tablet TAKE 2 TABLETS BY MOUTH AT ONSET OF COLD SORE AND REPEAT IN 12 HOURS 12/11/22   Burchette, Elberta Fortis, MD      Allergies    Anesthetics, amide; Pravastatin sodium; and Sulfadiazine    Review of Systems   Review of Systems  Constitutional:  Negative for fever.  Respiratory:  Negative for cough, chest tightness and shortness of breath.   Cardiovascular:  Positive for chest pain. Negative for palpitations.  Musculoskeletal:  Positive for neck pain.  Neurological:  Negative for dizziness, light-headedness and headaches.    Physical Exam Updated Vital Signs BP (!) 141/92 (BP Location: Right Arm)   Pulse 71   Temp 98.5 F (36.9 C)   Resp 16   Ht 5\' 1"  (1.549 m)   Wt 72.1 kg   SpO2 98%   BMI 30.04 kg/m  Physical Exam Vitals and nursing note reviewed.  Constitutional:      General: She is not in acute distress.  Appearance: Normal appearance. She is not ill-appearing or diaphoretic.  Eyes:     General: Vision grossly intact.     Conjunctiva/sclera: Conjunctivae normal.     Pupils: Pupils are equal, round, and reactive to light.  Neck:   Cardiovascular:     Rate and Rhythm: Normal rate and regular rhythm.     Heart sounds: Normal heart sounds.  Pulmonary:     Effort: Pulmonary effort is normal. No tachypnea, accessory muscle usage or respiratory distress.     Breath sounds: Normal breath sounds and air entry.     Comments: Patient does have mild chest discomfort with deep breathing. Chest:     Chest wall: Tenderness present. No deformity, swelling or edema.     Musculoskeletal:     Cervical back: No signs of trauma or rigidity. Pain with movement (with rotation; R worse than L) and muscular tenderness (right side) present. No spinous process tenderness. Decreased range of motion (mild with R rotation).  Skin:    General: Skin is warm and dry.     Capillary Refill: Capillary refill takes less than 2 seconds.  Neurological:     Mental Status: She is alert. Mental status is at baseline.  Psychiatric:        Mood and Affect: Mood normal.        Behavior: Behavior normal.     ED Results / Procedures / Treatments   Labs (all labs ordered are listed, but only abnormal results are displayed) Labs Reviewed  BASIC METABOLIC PANEL - Abnormal; Notable for the following components:      Result Value   Creatinine, Ser 1.12 (*)    GFR, Estimated 54 (*)    All other components within normal limits  CBC  TROPONIN I (HIGH SENSITIVITY)    EKG None  Radiology DG Chest 2 View  Result Date: 01/04/2023 CLINICAL DATA:  Chest pain EXAM: CHEST - 2 VIEW COMPARISON:  12/26/2019 FINDINGS: The heart size and mediastinal contours are within normal limits. Both lungs are clear. The visualized skeletal structures are unremarkable. IMPRESSION: No active cardiopulmonary disease. Electronically Signed   By: Charlett Nose M.D.   On: 01/04/2023 19:35    Procedures Procedures    Medications Ordered in ED Medications - No data to display  ED Course/ Medical Decision Making/ A&P                                 Medical Decision Making Amount and/or Complexity of Data Reviewed Labs: ordered. Radiology: ordered.  Risk Prescription drug management.   This patient presents to the ED with chief complaint(s) of chest and neck pain with pertinent past medical history of hypertension.  The complaint involves an extensive differential diagnosis and also carries with it a high risk of complications and morbidity.    The differential diagnosis includes ACS,  musculoskeletal strain/injury   The initial plan is to obtain chest pain workup  Initial Assessment:   Exam significant for overall well-appearing patient who is not in acute distress.  Vital signs are stable, patient is afebrile.  Heart rate is normal with regular rhythm.  No murmur appreciated.  Lungs are clear to auscultation bilaterally.  Patient does experience some mild discomfort when taking a deep breath.  She does have tenderness to palpation of the right anterior chest wall.  Mildly reduced rotation of the cervical spine due to right sided pain.  Right sided paraspinal muscle  tenderness.  No spinous process tenderness.  She does have increased neck pain with rotation.    Independent ECG/labs interpretation:  The following labs were independently interpreted:  CBC without leukocytosis or anemia.  Metabolic panel with mildly elevated creatinine, but not AKI.  No major electrolyte disturbance.  Troponin negative.  Independent visualization and interpretation of imaging: I independently visualized the following imaging with scope of interpretation limited to determining acute life threatening conditions related to emergency care: chest x-ray, which revealed no evidence of acute abnormality.  Disposition:   Suspect patient's symptoms are musculoskeletal in nature given she has been doing yard work and symptoms began after this.  Patient does have atypical chest pain that is tender on palpation.  Very low suspicion for cardiac cause, troponin was negative.  Will send patient home on prescription NSAID and muscle relaxant.  Patient also given cervical strain rehab exercises to do and discussed with her supportive care measures.  The patient has been appropriately medically screened and/or stabilized in the ED. I have low suspicion for any other emergent medical condition which would require further screening, evaluation or treatment in the ED or require inpatient management. At time of discharge  the patient is hemodynamically stable and in no acute distress. I have discussed work-up results and diagnosis with patient and answered all questions. Patient is agreeable with discharge plan. We discussed strict return precautions for returning to the emergency department and they verbalized understanding.            Final Clinical Impression(s) / ED Diagnoses Final diagnoses:  Atypical chest pain  Strain of neck muscle, initial encounter    Rx / DC Orders ED Discharge Orders          Ordered    methocarbamol (ROBAXIN) 500 MG tablet  2 times daily        01/04/23 1959    diclofenac (VOLTAREN) 50 MG EC tablet  3 times daily        01/04/23 1959              Lenard Simmer, PA-C 01/04/23 2013    Coral Spikes, DO 01/05/23 0007

## 2023-01-04 NOTE — ED Triage Notes (Signed)
Right CP starting 3 days ago. Painful with deep breathing. No worse with lifting. Occasional radiation into neck. -N/-V/-D.   Hx HTN.

## 2023-01-04 NOTE — Discharge Instructions (Addendum)
Thank you for allowing [place] to be a part of your care today.  You were evaluated in the ED for neck and chest pain.  I suspect that your symptoms are musculoskeletal in nature.  I have sent in methocarbamol, a muscle relaxant, and diclofenac, an anti-inflammatory, for you to take over the next 10 days.  Please take these medications as prescribed.  Do not consume alcohol, drive, operate heavy machinery, or perform other tasks that require to be completely alert and awake while taking methocarbamol as this medication can be sedating.  I have attached helpful exercises that you can do to help with your neck tightness and pain.  I also recommend applying a warm compress or heating pad 2-3 times per day for no longer than 20 minutes at a time to your neck and/or chest wall.  I recommend following up with your primary care provider if your symptoms persist.  Return to the ED if you develop sudden worsening of your symptoms, have shortness of breath, worsening chest pain, or if you have any new concerns.

## 2023-01-23 ENCOUNTER — Telehealth: Payer: Self-pay

## 2023-01-23 NOTE — Telephone Encounter (Signed)
Transition Care Management Unsuccessful Follow-up Telephone Call  Date of discharge and from where:  [facility] 11/15  Attempts:  1st Attempt  Reason for unsuccessful TCM follow-up call:  No answer/busy   Debbie Werner [place]  [facility], [facility] Guide, Phone: [phone] Website: [email or web address]

## 2023-01-24 ENCOUNTER — Telehealth: Payer: Self-pay

## 2023-01-24 NOTE — Telephone Encounter (Signed)
 Transition Care Management Unsuccessful Follow-up Telephone Call  Date of discharge and from where:  [facility] 11/15  Attempts:  2nd Attempt  Reason for unsuccessful TCM follow-up call:  No answer/busy   Lenard Forth [place]  [facility], [facility] Guide, Phone: [phone] Website: [email or web address]

## 2023-02-07 DIAGNOSIS — Z1231 Encounter for screening mammogram for malignant neoplasm of breast: Secondary | ICD-10-CM | POA: Diagnosis not present

## 2023-02-07 LAB — HM MAMMOGRAPHY

## 2023-02-11 ENCOUNTER — Encounter: Payer: Self-pay | Admitting: Family Medicine

## 2023-02-11 ENCOUNTER — Other Ambulatory Visit: Payer: Self-pay | Admitting: Internal Medicine

## 2023-03-07 ENCOUNTER — Other Ambulatory Visit: Payer: Self-pay | Admitting: Family Medicine

## 2023-03-07 DIAGNOSIS — E7800 Pure hypercholesterolemia, unspecified: Secondary | ICD-10-CM

## 2023-03-07 NOTE — Telephone Encounter (Signed)
Copied from CRM [id number]. Topic: Clinical - Medication Refill >> Mar 07, 2023 11:31 AM Louie Boston wrote: Most Recent Primary Care Visit:  Provider: Gershon Crane A  Department: [facility]  Visit Type: OFFICE VISIT  Date: 07/11/2022  Medication: Evolocumab (REPATHA SURECLICK) 140 MG/ML SOAJ   Has the patient contacted their pharmacy? Yes (Agent: If no, request that the patient contact the pharmacy for the refill. If patient does not wish to contact the pharmacy document the reason why and proceed with request.) (Agent: If yes, when and what did the pharmacy advise?)  Patient contacted pharmacy but was advised that she needed to contact provider to request new refill as her insurance changed.   Is this the correct pharmacy for this prescription? Yes If no, delete pharmacy and type the correct one.  This is the patient's preferred pharmacy:  [facility] [address], [place] - [address] [place] [address] [place] [address] Phone: [phone] Fax: [phone]   Has the prescription been filled recently? No  Is the patient out of the medication? Yes  Has the patient been seen for an appointment in the last year OR does the patient have an upcoming appointment? Yes  Can we respond through MyChart? Yes  Agent: Please be advised that Rx refills may take up to 3 business days. We ask that you follow-up with your pharmacy.

## 2023-03-08 ENCOUNTER — Other Ambulatory Visit (HOSPITAL_COMMUNITY): Payer: Self-pay

## 2023-03-08 ENCOUNTER — Telehealth: Payer: Self-pay | Admitting: Pharmacy Technician

## 2023-03-08 MED ORDER — REPATHA SURECLICK 140 MG/ML ~~LOC~~ SOAJ: SUBCUTANEOUS | 5 refills | Status: DC

## 2023-03-08 NOTE — Telephone Encounter (Signed)
Pharmacy Patient Advocate Encounter   Received notification from CoverMyMeds that prior authorization for Repatha SureClick 140MG /ML auto-injectors is required/requested.   Insurance verification completed.   The patient is insured through [facility] ADVANTAGE/RX ADVANCE .   Per test claim: PA required; PA submitted to above mentioned insurance via CoverMyMeds Key/confirmation #/EOC [id number] Status is pending

## 2023-03-11 ENCOUNTER — Telehealth: Payer: Self-pay | Admitting: Family Medicine

## 2023-03-11 NOTE — Telephone Encounter (Signed)
Copied from CRM [id number]. Topic: General - Other >> Mar 11, 2023  9:28 AM Irine Seal wrote: Reason for CRM: Duwayne Heck, with [employer] advantage, regarding the repatha request, stated that the previous prior auth was extended until 02/09/24, he is requesting permission to dismiss the request that was sent on 03/08/23 since it is no longer needed due to the plan extending the previous one.   Callback [phone] option 2

## 2023-03-12 ENCOUNTER — Telehealth: Payer: Self-pay | Admitting: Family Medicine

## 2023-03-12 ENCOUNTER — Other Ambulatory Visit (HOSPITAL_COMMUNITY): Payer: Self-pay

## 2023-03-12 NOTE — Telephone Encounter (Signed)
Pharmacy Patient Advocate Encounter  Received notification from [facility] ADVANTAGE/RX ADVANCE that Prior Authorization for  Repatha SureClick 140MG /ML auto-injectors  has been APPROVED from 03/10/23 to 02/19/24. Ran test claim, Copay is $47.00. This test claim was processed through [facility]- copay amounts may vary at other pharmacies due to pharmacy/plan contracts, or as the patient moves through the different stages of their insurance plan.   PA #/Case ID/Reference #: [id number]

## 2023-03-12 NOTE — Telephone Encounter (Signed)
Please see note below.  Copied from CRM [id number]. Topic: Clinical - Prescription Issue >> Mar 12, 2023  9:46 AM Clayton Bibles wrote: Reason for CRM: Teandra is calling about her Repatha SureClick 140MG /ML auto-injectors approval from Dr. Caryl Never. Please call her at [phone]. She is out of the medication since November because she was in the doughnut hole with her insurance.

## 2023-03-12 NOTE — Telephone Encounter (Signed)
PA request has been Approved. New Encounter created for follow up. For additional info see Pharmacy Prior Auth telephone encounter from 03/08/23.

## 2023-03-13 NOTE — Telephone Encounter (Signed)
Rishibh G. Informed of the message below and voiced understanding

## 2023-03-31 ENCOUNTER — Other Ambulatory Visit: Payer: Self-pay | Admitting: Family Medicine

## 2023-05-12 ENCOUNTER — Other Ambulatory Visit: Payer: Self-pay | Admitting: Internal Medicine

## 2023-05-22 IMAGING — DX DG KNEE COMPLETE 4+V*R*
4 series · 4 of 4 positions shown · non-contrast
Comparison: None.

CLINICAL DATA: Right knee pain.

EXAM:
RIGHT KNEE - COMPLETE 4+ VIEW

[knee ap]
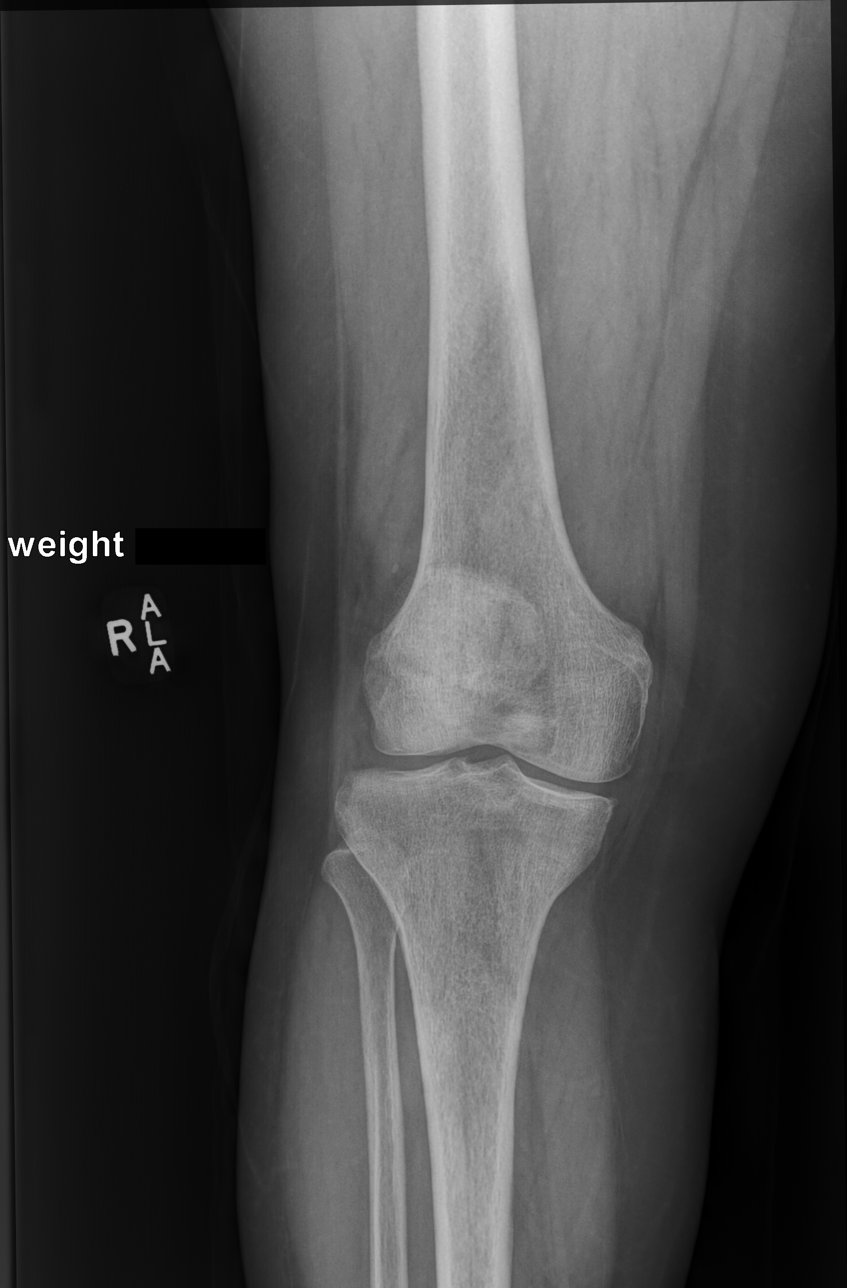

[knee [person_name] view pa]
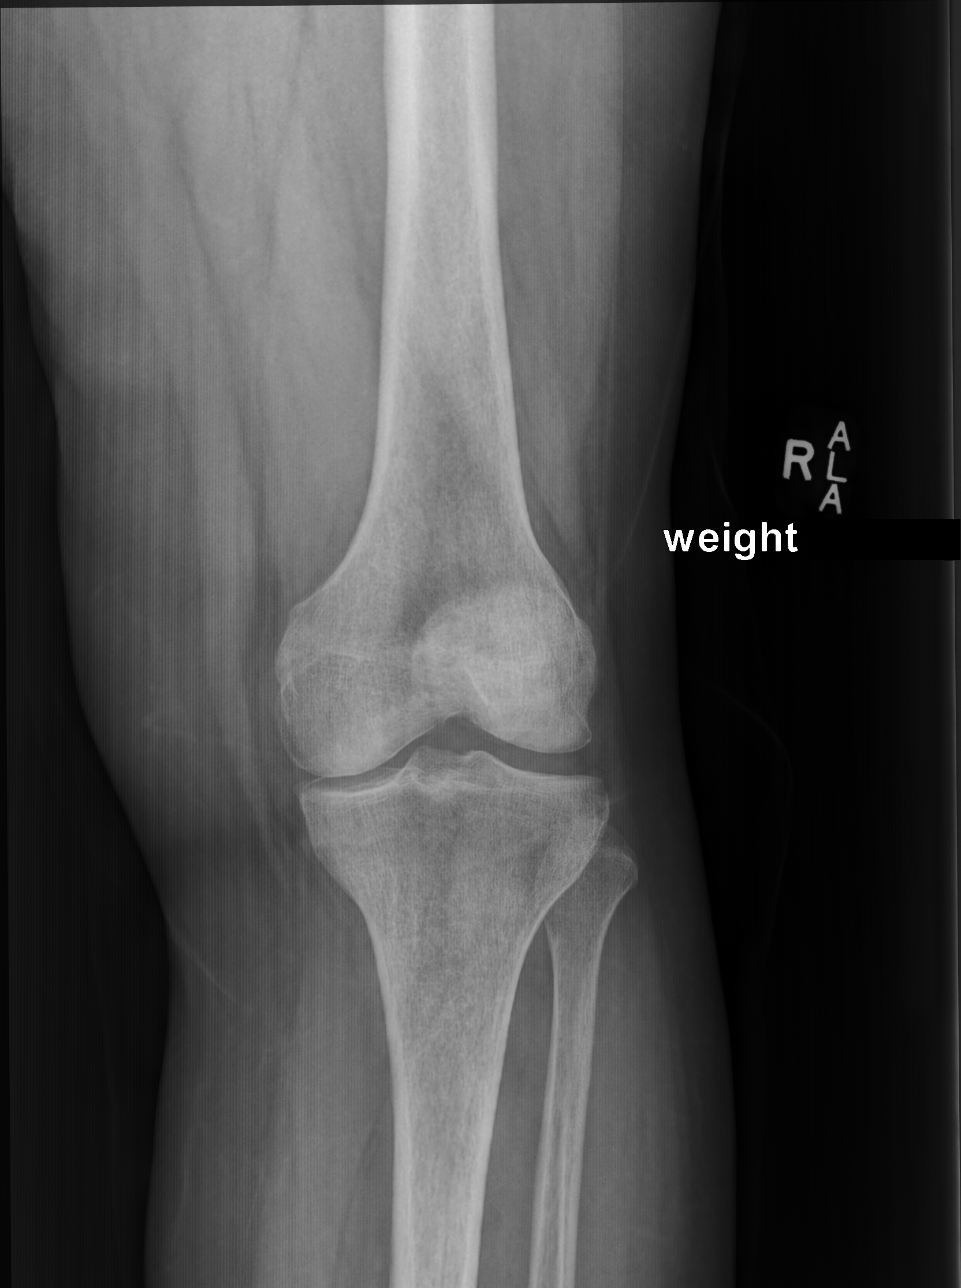

[patella (sunrise) tan]
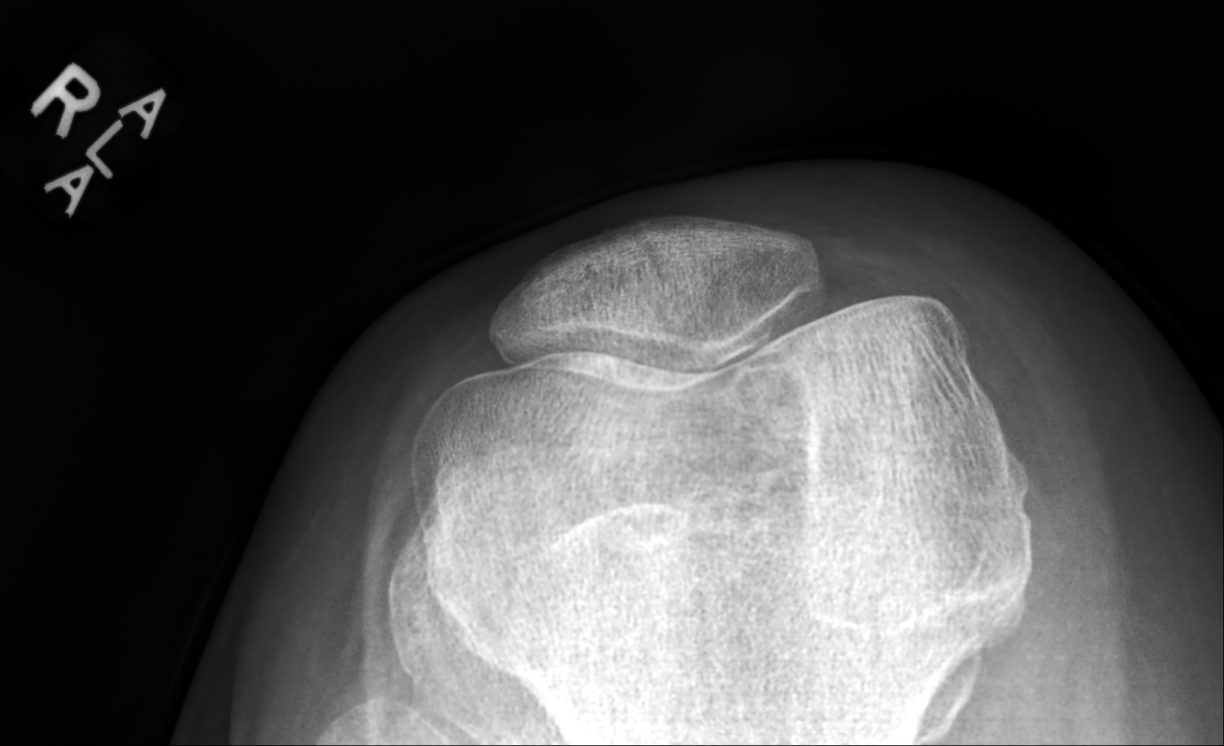

[knee lat]
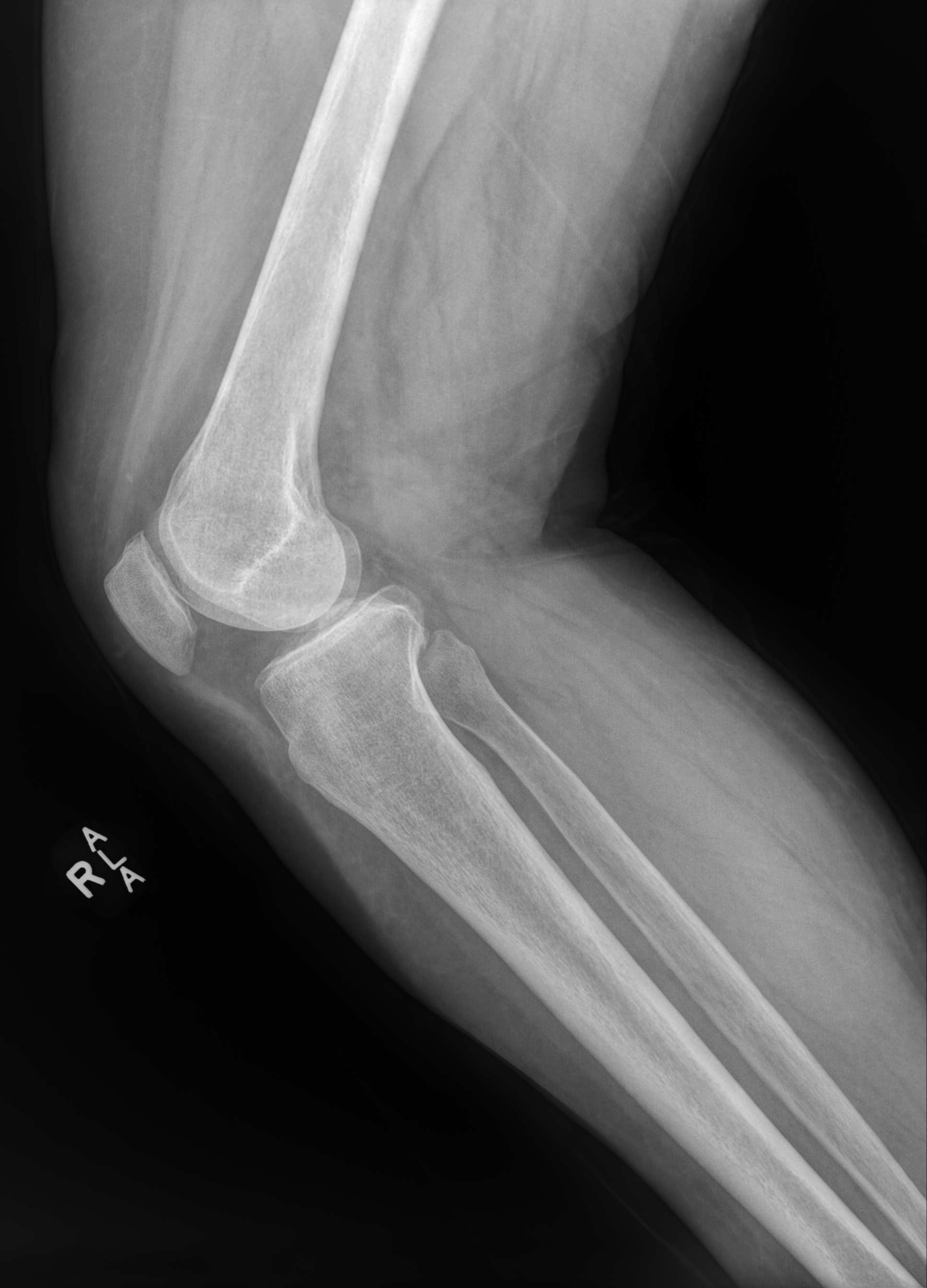

[4 of 4 positions shown; findings below may reference images not displayed]

FINDINGS: Mildly decreased bone mineralization. Mild medial compartment joint
space narrowing and peripheral osteophytosis. No joint effusion.
Mild superior patellar degenerative osteophytosis. No acute fracture
or dislocation.
IMPRESSION: Very mild medial and patellofemoral compartment osteoarthritis.

## 2023-06-19 ENCOUNTER — Other Ambulatory Visit: Payer: Self-pay | Admitting: Internal Medicine

## 2023-07-03 ENCOUNTER — Other Ambulatory Visit: Payer: Self-pay | Admitting: Family Medicine

## 2023-07-18 DIAGNOSIS — H43393 Other vitreous opacities, bilateral: Secondary | ICD-10-CM | POA: Diagnosis not present

## 2023-07-22 ENCOUNTER — Other Ambulatory Visit: Payer: Self-pay | Admitting: Internal Medicine

## 2023-07-29 DIAGNOSIS — H1013 Acute atopic conjunctivitis, bilateral: Secondary | ICD-10-CM | POA: Diagnosis not present

## 2023-08-03 ENCOUNTER — Other Ambulatory Visit: Payer: Self-pay | Admitting: Family Medicine

## 2023-08-12 ENCOUNTER — Encounter: Payer: Self-pay | Admitting: Family Medicine

## 2023-08-12 ENCOUNTER — Ambulatory Visit: Payer: Self-pay | Admitting: Family Medicine

## 2023-08-12 ENCOUNTER — Ambulatory Visit (INDEPENDENT_AMBULATORY_CARE_PROVIDER_SITE_OTHER): Admitting: Family Medicine

## 2023-08-12 VITALS — BP 180/100 | HR 63 | Temp 97.8°F | Wt 169.1 lb

## 2023-08-12 DIAGNOSIS — I10 Essential (primary) hypertension: Secondary | ICD-10-CM | POA: Diagnosis not present

## 2023-08-12 DIAGNOSIS — E7800 Pure hypercholesterolemia, unspecified: Secondary | ICD-10-CM | POA: Diagnosis not present

## 2023-08-12 DIAGNOSIS — B001 Herpesviral vesicular dermatitis: Secondary | ICD-10-CM

## 2023-08-12 DIAGNOSIS — I25119 Atherosclerotic heart disease of native coronary artery with unspecified angina pectoris: Secondary | ICD-10-CM | POA: Diagnosis not present

## 2023-08-12 LAB — COMPREHENSIVE METABOLIC PANEL WITH GFR
ALT: 24 U/L (ref 0–35)
AST: 19 U/L (ref 0–37)
Albumin: 4 g/dL (ref 3.5–5.2)
Alkaline Phosphatase: 77 U/L (ref 39–117)
BUN: 16 mg/dL (ref 6–23)
CO2: 29 meq/L (ref 19–32)
Calcium: 9.2 mg/dL (ref 8.4–10.5)
Chloride: 107 meq/L (ref 96–112)
Creatinine, Ser: 0.98 mg/dL (ref 0.40–1.20)
GFR: 59.02 mL/min — ABNORMAL LOW (ref >60.00)
Glucose, Bld: 86 mg/dL (ref 70–99)
Potassium: 4.3 meq/L (ref 3.5–5.1)
Sodium: 141 meq/L (ref 135–145)
Total Bilirubin: 0.7 mg/dL (ref 0.2–1.2)
Total Protein: 6.7 g/dL (ref 6.0–8.3)

## 2023-08-12 LAB — LIPID PANEL
Cholesterol: 123 mg/dL (ref 0–200)
HDL: 38.8 mg/dL — ABNORMAL LOW (ref >39.00)
LDL Cholesterol: 56 mg/dL (ref 0–99)
NonHDL: 83.84
Total CHOL/HDL Ratio: 3
Triglycerides: 139 mg/dL (ref 0.0–149.0)
VLDL: 27.8 mg/dL (ref 0.0–40.0)

## 2023-08-12 MED ORDER — MELOXICAM 15 MG PO TABS: 15.0000 mg | ORAL_TABLET | Freq: Every day | ORAL | 0 refills | Status: DC

## 2023-08-12 MED ORDER — LISINOPRIL 20 MG PO TABS: 20.0000 mg | ORAL_TABLET | Freq: Every day | ORAL | 3 refills | Status: DC

## 2023-08-12 MED ORDER — REPATHA SURECLICK 140 MG/ML ~~LOC~~ SOAJ: SUBCUTANEOUS | 5 refills | Status: AC

## 2023-08-12 MED ORDER — METHOCARBAMOL 500 MG PO TABS: 500.0000 mg | ORAL_TABLET | Freq: Four times a day (QID) | ORAL | 1 refills | Status: DC | PRN

## 2023-08-12 MED ORDER — VALACYCLOVIR HCL 1 G PO TABS: ORAL_TABLET | ORAL | 0 refills | Status: AC

## 2023-08-12 MED ORDER — ALPRAZOLAM 0.25 MG PO TABS: 0.2500 mg | ORAL_TABLET | Freq: Two times a day (BID) | ORAL | 0 refills | Status: AC | PRN

## 2023-08-12 NOTE — Progress Notes (Signed)
 Established Patient Office Visit  Subjective   Patient ID: Debbie Werner, female    DOB: 13-Dec-1955  Age: 68 y.o. MRN: [id number]  Chief Complaint  Patient presents with   Medical Management of Chronic Issues    HPI   Debbie Werner is here for chronic medical follow-up.  Debbie Werner has history of CAD, hypertension, GERD, recurrent cold sores, history of statin myopathy, chronic insomnia, hyperlipidemia.  Generally doing well.  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  They have had some recent challenges with the hot weather.  Debbie Werner needs refills of several medications today including lisinopril  and Valtrex .  Debbie Werner also needs Repatha .  Debbie Werner does not take meloxicam  regularly but has osteoarthritis and requesting refill.  Also infrequently takes alprazolam  for severe anxiety symptoms.  Blood pressure is up today.  Debbie Werner states Debbie Werner home blood pressures are typically 120s and sometimes even lower systolic and diastolics also usually well-controlled.  Debbie Werner states today's blood pressure is an outlier.  Tries to watch Debbie Werner sodium intake.  Denies any recent headaches or dizziness.  Past Medical History:  Diagnosis Date   ALLERGIC RHINITIS 08/03/2008   Basal cell carcinoma    nose   Cancer (HCC)    Skin - has had spots removed for years   CHEST PAIN, ATYPICAL 10/12/2008   CONSTIPATION, CHRONIC 12/08/2009   COVID    DEPRESSION 12/08/2009   GERD 08/03/2008   Gestational diabetes    HEMATOCHEZIA 05/02/2009   HYPERLIPIDEMIA 08/03/2008   HYPERTENSION 08/03/2008   Obesity    SINUSITIS, CHRONIC 02/24/2009   Past Surgical History:  Procedure Laterality Date   BREAST SURGERY     bx   BREATH TEK H PYLORI  12/21/2011   Procedure: BREATH TEK H PYLORI;  Surgeon: Donnice KATHEE Lunger, MD;  Location: [facility];  Service: General;  Laterality: N/A;  Katy/Andrea   BUNIONECTOMY Left    CYSTECTOMY  1996   [place]   METATARSAL OSTEOTOMY Left    NEURECTOMY FOOT  Left    TUBAL LIGATION      reports that Debbie Werner has never smoked. Debbie Werner has never used smokeless tobacco. Debbie Werner reports that Debbie Werner does not drink alcohol and does not use drugs. family history includes Heart disease (age of onset: 84) in Debbie Werner father. Allergies  Allergen Reactions   Anesthetics, Amide Nausea Only   Pravastatin Sodium     REACTION: effects brain memory   Sulfadiazine     GI upset    Review of Systems  Eyes:  Negative for blurred vision.  Respiratory:  Negative for shortness of breath.   Cardiovascular:  Negative for chest pain.  Gastrointestinal:  Negative for abdominal pain.  Musculoskeletal:  Positive for joint pain.  Neurological:  Negative for dizziness, weakness and headaches.      Objective:     BP (!) 180/100 (BP Location: Left Arm, Patient Position: Sitting, Cuff Size: Normal)   Pulse 63   Temp 97.8 F (36.6 C) (Oral)   Wt 169 lb 1.6 oz (76.7 kg)   SpO2 95%   BMI 31.95 kg/m  BP Readings from Last 3 Encounters:  08/12/23 (!) 180/100  01/04/23 (!) 154/90  07/11/22 118/80   Wt Readings from Last 3 Encounters:  08/12/23 169 lb 1.6 oz (76.7 kg)  01/04/23 159 lb (72.1 kg)  07/11/22 164 lb 3.2 oz (74.5 kg)      Physical Exam Vitals reviewed.  Constitutional:  General: Debbie Werner is not in acute distress.    Appearance: Debbie Werner is not ill-appearing.   Cardiovascular:     Rate and Rhythm: Normal rate and regular rhythm.  Pulmonary:     Effort: Pulmonary effort is normal.     Breath sounds: Normal breath sounds.   Musculoskeletal:     Right lower leg: No edema.     Left lower leg: No edema.   Neurological:     Mental Status: Debbie Werner is alert.      No results found for any visits on 08/12/23.    The ASCVD Risk score (Arnett DK, et al., 2019) failed to calculate for the following reasons:   The valid total cholesterol range is 130 to 320 mg/dL    Assessment & Plan:   #1 hyperlipidemia and history of CAD.  History of statin intolerance.  Patient on  Repatha .  Refilled for 1 year.  Check lipid and CMP.  Continue low saturated fat diet.  #2 hypertension.  Poorly controlled by today's reading.  This did come down substantially after rest but still not to goal.  We recommend Debbie Werner monitor Debbie Werner blood pressure regularly over the next few weeks and record readings and bring in Debbie Werner cuff along with readings in about 3 to 4 weeks to reassess.  If not further to goal at that point consider addition of HCTZ or possibly calcium , blocker such as amlodipine  #3 osteoarthritis involving multiple joints.  Discussed safety issues with nonsteroidals.  Avoid any regular nonsteroidals.  Did agree to short-term only use of meloxicam  15 mg once daily as needed  #4 history of recurrent cold sores.  Refill Valtrex  for as needed use 2 g at onset and then repeat 2 g in 12 hours as needed   Wolm Scarlet, MD

## 2023-08-12 NOTE — Patient Instructions (Signed)
 Monitor blood pressure at home and record readings  Bring in your BP cuff to compare with ours at follow up.

## 2023-08-26 ENCOUNTER — Other Ambulatory Visit: Payer: Self-pay | Admitting: Internal Medicine

## 2023-09-09 ENCOUNTER — Encounter: Payer: Self-pay | Admitting: Family Medicine

## 2023-09-09 ENCOUNTER — Ambulatory Visit (INDEPENDENT_AMBULATORY_CARE_PROVIDER_SITE_OTHER): Admitting: Family Medicine

## 2023-09-09 ENCOUNTER — Ambulatory Visit: Payer: Self-pay | Admitting: Family Medicine

## 2023-09-09 VITALS — BP 186/108 | HR 78 | Temp 97.5°F | Wt 173.4 lb

## 2023-09-09 DIAGNOSIS — R079 Chest pain, unspecified: Secondary | ICD-10-CM

## 2023-09-09 DIAGNOSIS — I25119 Atherosclerotic heart disease of native coronary artery with unspecified angina pectoris: Secondary | ICD-10-CM | POA: Diagnosis not present

## 2023-09-09 DIAGNOSIS — I10 Essential (primary) hypertension: Secondary | ICD-10-CM

## 2023-09-09 MED ORDER — AMLODIPINE BESYLATE 5 MG PO TABS: 5.0000 mg | ORAL_TABLET | Freq: Every day | ORAL | 1 refills | Status: DC

## 2023-09-09 NOTE — Progress Notes (Signed)
 Established Patient Office Visit  Subjective   Patient ID: Debbie Werner, female    DOB: [birth year]  Age: 68 y.o. MRN: [id number]  Chief Complaint  Patient presents with   Shortness of Breath        Chest Pain   Medical Management of Chronic Issues    HPI   Debbie Werner has known history of CAD, hypertension, GERD, statin intolerance, chronic insomnia, hyperlipidemia.  She states that she has had some recent increased frequency of sharp transient chest pains usually left chest.  Symptoms usually last about a minute.  Occur at rest.  Occasionally has nausea.  Occasional radiation into the left upper extremity.  No recent heavy exertion.  She states over the weekend she was doing some dusting and had some increased dyspnea.  She had nuclear stress test 7/22 which came back with no concerning abnormalities.  Echocardiogram 2021 showed normal EF with no major valve problems.  Previous CT morphology study with coronary calcium  score of 304 back in 2021 with moderate stenosis proximal LAD.  She had CT FFR analysis which did not show any significant stenosis.  Was recently seen with very high blood pressure.  She has been monitoring this closely and is still gotten several readings 160 and even up to 180 occasionally systolic with diastolics 90s to 100 frequently.  She is on Repatha .  Recent total cholesterol 123 with LDL 56.  Intolerance to statins  Past Medical History:  Diagnosis Date   ALLERGIC RHINITIS 08/03/2008   Basal cell carcinoma    nose   Cancer (HCC)    Skin - has had spots removed for years   CHEST PAIN, ATYPICAL 10/12/2008   CONSTIPATION, CHRONIC 12/08/2009   COVID    DEPRESSION 12/08/2009   GERD 08/03/2008   Gestational diabetes    HEMATOCHEZIA 05/02/2009   HYPERLIPIDEMIA 08/03/2008   HYPERTENSION 08/03/2008   Obesity    SINUSITIS, CHRONIC 02/24/2009   Past Surgical History:  Procedure Laterality Date   BREAST SURGERY     bx   BREATH TEK H PYLORI  12/21/2011    Procedure: BREATH TEK H PYLORI;  Surgeon: Donnice KATHEE Lunger, MD;  Location: [facility];  Service: General;  Laterality: N/A;  Katy/Andrea   BUNIONECTOMY Left    CYSTECTOMY  1996   ovary   METATARSAL OSTEOTOMY Left    NEURECTOMY FOOT Left    TUBAL LIGATION      reports that she has never smoked. She has never used smokeless tobacco. She reports that she does not drink alcohol and does not use drugs. family history includes Heart disease (age of onset: 67) in her father. Allergies  Allergen Reactions   Anesthetics, Amide Nausea Only   Pravastatin Sodium     REACTION: effects brain memory   Sulfadiazine     GI upset    Review of Systems  Constitutional:  Negative for chills, fever and malaise/fatigue.  Eyes:  Negative for blurred vision.  Respiratory:  Positive for shortness of breath. Negative for cough, hemoptysis and wheezing.   Cardiovascular:  Positive for chest pain.  Genitourinary:  Negative for dysuria.  Neurological:  Negative for dizziness, weakness and headaches.      Objective:     BP (!) 186/108 (BP Location: Left Arm, Patient Position: Sitting, Cuff Size: Normal)   Pulse 78   Temp (!) 97.5 F (36.4 C) (Oral)   Wt 173 lb 6.4 oz (78.7 kg)   SpO2 95%   BMI 32.76 kg/m  BP  Readings from Last 3 Encounters:  09/09/23 (!) 186/108  08/12/23 (!) 180/100  01/04/23 (!) 154/90   Wt Readings from Last 3 Encounters:  09/09/23 173 lb 6.4 oz (78.7 kg)  08/12/23 169 lb 1.6 oz (76.7 kg)  01/04/23 159 lb (72.1 kg)      Physical Exam Vitals reviewed.  Constitutional:      General: She is not in acute distress.    Appearance: She is not ill-appearing.  Cardiovascular:     Rate and Rhythm: Normal rate and regular rhythm.  Pulmonary:     Effort: Pulmonary effort is normal.     Breath sounds: Normal breath sounds. No wheezing or rales.  Musculoskeletal:     Right lower leg: No edema.     Left lower leg: No edema.  Neurological:     Mental Status: She is alert.       No results found for any visits on 09/09/23.  Last CBC Lab Results  Component Value Date   WBC 9.1 01/04/2023   HGB 14.1 01/04/2023   HCT 42.5 01/04/2023   MCV 88.5 01/04/2023   MCH 29.4 01/04/2023   RDW 13.2 01/04/2023   PLT 346 01/04/2023   Last metabolic panel Lab Results  Component Value Date   GLUCOSE 86 08/12/2023   NA 141 08/12/2023   K 4.3 08/12/2023   CL 107 08/12/2023   CO2 29 08/12/2023   BUN 16 08/12/2023   CREATININE 0.98 08/12/2023   GFR 59.02 (L) 08/12/2023   CALCIUM  9.2 08/12/2023   PROT 6.7 08/12/2023   ALBUMIN 4.0 08/12/2023   BILITOT 0.7 08/12/2023   ALKPHOS 77 08/12/2023   AST 19 08/12/2023   ALT 24 08/12/2023   ANIONGAP 7 01/04/2023   Last lipids Lab Results  Component Value Date   CHOL 123 08/12/2023   HDL 38.80 (L) 08/12/2023   LDLCALC 56 08/12/2023   LDLDIRECT 203.7 03/19/2013   TRIG 139.0 08/12/2023   CHOLHDL 3 08/12/2023      The ASCVD Risk score (Arnett DK, et al., 2019) failed to calculate for the following reasons:   The valid total cholesterol range is 130 to 320 mg/dL    Assessment & Plan:   68 year old female with history of CAD presenting with poorly controlled hypertension and recent transient ,somewhat atypical chest pain.  Atypical features include occurring at rest and usually lasting about a minute.  EKG today shows sinus rhythm with no acute ST-T changes she does have abnormal CT morphology study as above 2021.  Currently pain-free.  We recommend the following:  - Start baby aspirin  81 mg daily - Add amlodipine  5 mg daily to her blood pressure regimen.  She will continue current dosage of lisinopril  - Continue low-sodium diet - Set up urgent cardiology referral to further assess - For any progressive chest pain symptoms call 911 or go directly to ER   No follow-ups on file.    Wolm Scarlet, MD

## 2023-09-09 NOTE — Patient Instructions (Signed)
 Start the Amlodipine  5 mg daily  Start aspirin  81 mg once daily  I am setting up urgent cardiology referral.

## 2023-09-10 ENCOUNTER — Ambulatory Visit: Attending: Cardiology | Admitting: Cardiology

## 2023-09-10 ENCOUNTER — Encounter: Payer: Self-pay | Admitting: Cardiology

## 2023-09-10 VITALS — BP 116/82 | HR 74 | Ht 61.0 in | Wt 173.0 lb

## 2023-09-10 DIAGNOSIS — I10 Essential (primary) hypertension: Secondary | ICD-10-CM

## 2023-09-10 DIAGNOSIS — R072 Precordial pain: Secondary | ICD-10-CM | POA: Diagnosis not present

## 2023-09-10 MED ORDER — METOPROLOL TARTRATE 50 MG PO TABS: 50.0000 mg | ORAL_TABLET | ORAL | 0 refills | Status: DC

## 2023-09-10 NOTE — Patient Instructions (Addendum)
 Medication Instructions:  The current medical regimen is effective;  continue present plan and medications.  *If you need a refill on your cardiac medications before your next appointment, please call your pharmacy*   Testing/Procedures:   Your cardiac CT will be scheduled at one of the below locations:   [facility] [address] [place], [place] [address] [phone]  Or   [facility] [address]  [place], [place] [address]  If scheduled at [facility], please arrive at the [facility] and Children's Entrance (Entrance C2) of [facility] 30 minutes prior to test start time. You can use the FREE valet parking offered at entrance C (encouraged to control the heart rate for the test)  Proceed to the [facility] Radiology Department (first floor) to check-in and test prep.  All radiology patients and guests should use entrance C2 at [facility], accessed from [facility], even though the hospital's physical address listed is [address]  If scheduled at the Heart and Vascular Tower at [address], please enter the parking lot using the [address] entrance and use the FREE valet service at the patient drop-off area. Enter the buidling and check-in with registration on the main floor.  Please follow these instructions carefully (unless otherwise directed):  An IV will be required for this test and Nitroglycerin  will be given.   On the Night Before the Test: Be sure to Drink plenty of water. Do not consume any caffeinated/decaffeinated beverages or chocolate 12 hours prior to your test. Do not take any antihistamines 12 hours prior to your test.  On the Day of the Test: Drink plenty of water until 1 hour prior to the test. Do not eat any food 1 hour prior to test. You may take your regular medications prior to the test.  Take metoprolol  (Lopressor ) two hours prior to test. If you  take Furosemide/Hydrochlorothiazide /Spironolactone/Chlorthalidone, please HOLD on the morning of the test. Patients who wear a continuous glucose monitor MUST remove the device prior to scanning. FEMALES- please wear underwire-free bra if available, avoid dresses & tight clothing      After the Test: Drink plenty of water. After receiving IV contrast, you may experience a mild flushed feeling. This is normal. On occasion, you may experience a mild rash up to 24 hours after the test. This is not dangerous. If this occurs, you can take Benadryl 25 mg, Zyrtec, Claritin, or Allegra and increase your fluid intake. (Patients taking Tikosyn should avoid Benadryl, and may take Zyrtec, Claritin, or Allegra) If you experience trouble breathing, this can be serious. If it is severe call 911 IMMEDIATELY. If it is mild, please call our office.  We will call to schedule your test 2-4 weeks out understanding that some insurance companies will need an authorization prior to the service being performed.   For more information and frequently asked questions, please visit our website : [email or web address]  For non-scheduling related questions, please contact the cardiac imaging nurse navigator should you have any questions/concerns: Cardiac Imaging Nurse Navigators Direct Office Dial: [phone]   For scheduling needs, including cancellations and rescheduling, please call [place], [phone].   Follow-Up: At [facility], you and your health needs are our priority.  As part of our continuing mission to provide you with exceptional heart care, our providers are all part of one team.  This team includes your primary Cardiologist (physician) and Advanced Practice Providers or APPs (Physician  Assistants and Nurse Practitioners) who all work together to provide you with the care you need, when you need it.  Your next appointment:   Follow up will be based on the results of the above  testing.   We recommend signing up for the patient portal called MyChart.  Sign up information is provided on this After Visit Summary.  MyChart is used to connect with patients for Virtual Visits (Telemedicine).  Patients are able to view lab/test results, encounter notes, upcoming appointments, etc.  Non-urgent messages can be sent to your provider as well.   To learn more about what you can do with MyChart, go to [email or web address].

## 2023-09-10 NOTE — Progress Notes (Signed)
 Cardiology Office Note:  .   Date:  09/10/2023  ID:  Debbie Werner, DOB 1956-02-06, MRN [id number] PCP: Micheal Wolm LELON, MD  [facility] Providers Cardiologist:  Oneil Parchment, MD    History of Present Illness: .   Debbie Werner is a 68 y.o. female Discussed the use of AI scribe software for clinical note transcription with the patient, who gave verbal consent to proceed.  History of Present Illness Debbie Werner is a 68 year old female with hypertension and hyperlipidemia who presents with chest discomfort.  She experiences sharp, left-sided chest pain that occurs intermittently, typically lasting about a minute and not associated with heavy exertion. The pain is accompanied by left arm heaviness and a general lack of energy. She also reports increased shortness of breath during housework. No severe chest pressure, sweating, or significant breathing difficulties.  In 2021, she had a coronary calcium  score of 304 and a CT FFR at that time showed no significant stenosis. Her recent LDL cholesterol level is 56 mg/dL while on Repatha , and she has recently started taking baby aspirin . Amlodipine  has been added to her medication regimen, and she continues to take lisinopril .  Her family history is significant for heart disease, with her father having had a heart attack and her grandmother dying at age 83 from heart disease. She does not smoke.    Prior office note from yesterday reviewed  ROS: No fevers chills nausea vomiting syncope  Studies Reviewed: .        Results LABS LDL: 56 mg/dL Creatinine: 9.01 mg/dL  DIAGNOSTIC ECG: No ischemic changes, sinus rhythm, heart rate 73 bpm Risk Assessment/Calculations:            Physical Exam:   VS:  BP 116/82   Pulse 74   Ht 5' 1 (1.549 m)   Wt 173 lb (78.5 kg)   SpO2 98%   BMI 32.69 kg/m    Wt Readings from Last 3 Encounters:  09/10/23 173 lb (78.5 kg)  09/09/23 173 lb 6.4 oz (78.7 kg)  08/12/23 169 lb 1.6 oz  (76.7 kg)    GEN: Well nourished, well developed in no acute distress NECK: No JVD; No carotid bruits CARDIAC: RRR, no murmurs, no rubs, no gallops RESPIRATORY:  Clear to auscultation without rales, wheezing or rhonchi  ABDOMEN: Soft, non-tender, non-distended EXTREMITIES:  No edema; No deformity   ASSESSMENT AND PLAN: .    Assessment and Plan Assessment & Plan Chest pain Intermittent sharp left-sided chest pain lasting about a minute, associated with left arm heaviness and increased dyspnea during housework. Previous coronary CT in 2021 showed moderate stenosis of the proximal LAD with a coronary calcium  score of 304, but no significant stenosis on CT FFR. Current EKG shows no ischemic changes. Differential diagnosis includes musculoskeletal pain versus cardiac etiology. Given the absence of severe symptoms such as significant chest pressure, sweating, or abnormal EKG, a non-invasive approach is preferred. - Order coronary CT scan to assess for any progression of coronary artery disease. - Consider heart catheterization if CT scan indicates flow limitation or significant stenosis.  Hypertension Blood pressure improved to 116/82 with the addition of amlodipine . Continues on lisinopril .  Hyperlipidemia LDL is well-controlled at 56 with Repatha . Statin intolerance noted.  Statin intolerance Intolerance to statins, managed with Repatha  for hyperlipidemia.  Family history of heart disease Significant family history of heart disease, including a father with a myocardial infarction and a grandmother who died at age 56 from heart  disease.  Follow-up Follow-up plans discussed to ensure timely evaluation and management based on CT scan results. - Schedule coronary CT scan and ensure preauthorization. - Contact her with results and discuss further management based on findings.         Dispo: Will follow up with results of study  Signed, Oneil Parchment, MD

## 2023-09-18 ENCOUNTER — Encounter (HOSPITAL_COMMUNITY): Payer: Self-pay

## 2023-09-20 ENCOUNTER — Telehealth (HOSPITAL_COMMUNITY): Payer: Self-pay | Admitting: Emergency Medicine

## 2023-09-20 NOTE — Telephone Encounter (Signed)
 Reaching out to patient to offer assistance regarding upcoming cardiac imaging study; pt verbalizes understanding of appt date/time, parking situation and where to check in, pre-test NPO status and medications ordered, and verified current allergies; name and call back number provided for further questions should they arise Rockwell Alexandria RN Navigator Cardiac Imaging [facility] [phone] office [phone] cell

## 2023-09-23 ENCOUNTER — Ambulatory Visit (HOSPITAL_COMMUNITY)
Admission: RE | Admit: 2023-09-23 | Discharge: 2023-09-23 | Disposition: A | Discharge status: Home / Self Care | Source: Ambulatory Visit | Attending: Cardiology | Admitting: Cardiology

## 2023-09-23 DIAGNOSIS — Q2112 Patent foramen ovale: Secondary | ICD-10-CM | POA: Diagnosis not present

## 2023-09-23 DIAGNOSIS — I700 Atherosclerosis of aorta: Secondary | ICD-10-CM | POA: Diagnosis not present

## 2023-09-23 DIAGNOSIS — I2510 Atherosclerotic heart disease of native coronary artery without angina pectoris: Secondary | ICD-10-CM | POA: Diagnosis not present

## 2023-09-23 DIAGNOSIS — R072 Precordial pain: Secondary | ICD-10-CM | POA: Diagnosis not present

## 2023-09-23 MED ORDER — NITROGLYCERIN 0.4 MG SL SUBL
0.8000 mg | SUBLINGUAL_TABLET | Freq: Once | SUBLINGUAL | Status: AC
  Administered 2023-09-23: 0.8 mg via SUBLINGUAL

## 2023-09-23 MED ORDER — IOHEXOL 350 MG/ML SOLN
100.0000 mL | Freq: Once | INTRAVENOUS | Status: AC | PRN
  Administered 2023-09-23: 100 mL via INTRAVENOUS

## 2023-09-24 ENCOUNTER — Ambulatory Visit: Payer: Self-pay | Admitting: Cardiology

## 2023-09-30 ENCOUNTER — Encounter: Payer: Self-pay | Admitting: Family Medicine

## 2023-09-30 ENCOUNTER — Ambulatory Visit (INDEPENDENT_AMBULATORY_CARE_PROVIDER_SITE_OTHER): Admitting: Family Medicine

## 2023-09-30 VITALS — BP 120/66 | HR 71 | Temp 98.1°F | Wt 171.9 lb

## 2023-09-30 DIAGNOSIS — I10 Essential (primary) hypertension: Secondary | ICD-10-CM

## 2023-09-30 DIAGNOSIS — E669 Obesity, unspecified: Secondary | ICD-10-CM | POA: Diagnosis not present

## 2023-09-30 DIAGNOSIS — E7800 Pure hypercholesterolemia, unspecified: Secondary | ICD-10-CM

## 2023-09-30 DIAGNOSIS — I25119 Atherosclerotic heart disease of native coronary artery with unspecified angina pectoris: Secondary | ICD-10-CM | POA: Diagnosis not present

## 2023-09-30 MED ORDER — ONDANSETRON 4 MG PO TBDP: 4.0000 mg | ORAL_TABLET | Freq: Three times a day (TID) | ORAL | 1 refills | Status: AC | PRN

## 2023-09-30 MED ORDER — AMLODIPINE BESYLATE 5 MG PO TABS: 5.0000 mg | ORAL_TABLET | Freq: Every day | ORAL | 3 refills | Status: AC

## 2023-09-30 NOTE — Progress Notes (Signed)
 Established Patient Office Visit  Subjective   Patient ID: Debbie Werner, female    DOB: August 28, 1955  Age: 68 y.o. MRN: [id number]  Chief Complaint  Patient presents with   Medication Consultation    HPI   Debbie Werner is seen for medical follow-up.  She came in recently with severe elevated blood pressure and some chest pain.  History of known elevated coronary calcium  score.  We started amlodipine  5 mg daily and referred her back to cardiology.  She had CT morphology study which showed no flow-limiting lesions.  She had approximately 50% RCA lesion.  She has some mild dyspnea with exertion but no chest pain since last visit.  Blood pressures done much better since starting amlodipine .  She remains on lisinopril  as well.  She has had some very mild late day leg edema but tolerable.  She is also now taking aspirin  81 mg at advice of cardiology.  She is on Repatha  for hyperlipidemia with recent cholesterol 123 with LDL 56 and triglycerides 139.  She takes Ozempic  0.25 mg subcutaneous once weekly.  She previously had some nausea when she went to 0.5.  She does not see weight loss with 0.25.  Past Medical History:  Diagnosis Date   ALLERGIC RHINITIS 08/03/2008   Basal cell carcinoma    nose   Cancer (HCC)    Skin - has had spots removed for years   CHEST PAIN, ATYPICAL 10/12/2008   CONSTIPATION, CHRONIC 12/08/2009   COVID    DEPRESSION 12/08/2009   GERD 08/03/2008   Gestational diabetes    HEMATOCHEZIA 05/02/2009   HYPERLIPIDEMIA 08/03/2008   HYPERTENSION 08/03/2008   Obesity    SINUSITIS, CHRONIC 02/24/2009   Past Surgical History:  Procedure Laterality Date   BREAST SURGERY     bx   BREATH TEK H PYLORI  12/21/2011   Procedure: BREATH TEK H PYLORI;  Surgeon: Donnice KATHEE Lunger, MD;  Location: [facility];  Service: General;  Laterality: N/A;  Katy/Andrea   BUNIONECTOMY Left    CYSTECTOMY  1996   ovary   METATARSAL OSTEOTOMY Left    NEURECTOMY FOOT Left    TUBAL LIGATION       reports that she has never smoked. She has never used smokeless tobacco. She reports that she does not drink alcohol and does not use drugs. family history includes Heart disease (age of onset: 70) in her father. Allergies  Allergen Reactions   Anesthetics, Amide Nausea Only   Pravastatin Sodium     REACTION: effects brain memory   Sulfadiazine     GI upset    Review of Systems  Constitutional:  Negative for malaise/fatigue.  Eyes:  Negative for blurred vision.  Respiratory:  Negative for shortness of breath.   Cardiovascular:  Negative for chest pain and leg swelling.  Neurological:  Negative for dizziness, weakness and headaches.      Objective:     BP 120/66   Pulse 71   Temp 98.1 F (36.7 C) (Oral)   Wt 171 lb 14.4 oz (78 kg)   SpO2 97%   BMI 32.48 kg/m  BP Readings from Last 3 Encounters:  09/30/23 120/66  09/23/23 (!) 142/89  09/10/23 116/82   Wt Readings from Last 3 Encounters:  09/30/23 171 lb 14.4 oz (78 kg)  09/10/23 173 lb (78.5 kg)  09/09/23 173 lb 6.4 oz (78.7 kg)      Physical Exam Vitals reviewed.  Constitutional:      General: She is not  in acute distress.    Appearance: She is well-developed. She is not ill-appearing.  Eyes:     Pupils: Pupils are equal, round, and reactive to light.  Neck:     Thyroid : No thyromegaly.     Vascular: No JVD.  Cardiovascular:     Rate and Rhythm: Normal rate and regular rhythm.     Heart sounds:     No gallop.  Pulmonary:     Effort: Pulmonary effort is normal. No respiratory distress.     Breath sounds: Normal breath sounds. No wheezing or rales.  Musculoskeletal:     Cervical back: Neck supple.     Right lower leg: No edema.     Left lower leg: No edema.  Neurological:     Mental Status: She is alert.      No results found for any visits on 09/30/23.  Last CBC Lab Results  Component Value Date   WBC 9.1 01/04/2023   HGB 14.1 01/04/2023   HCT 42.5 01/04/2023   MCV 88.5 01/04/2023    MCH 29.4 01/04/2023   RDW 13.2 01/04/2023   PLT 346 01/04/2023   Last metabolic panel Lab Results  Component Value Date   GLUCOSE 86 08/12/2023   NA 141 08/12/2023   K 4.3 08/12/2023   CL 107 08/12/2023   CO2 29 08/12/2023   BUN 16 08/12/2023   CREATININE 0.98 08/12/2023   GFR 59.02 (L) 08/12/2023   CALCIUM  9.2 08/12/2023   PROT 6.7 08/12/2023   ALBUMIN 4.0 08/12/2023   BILITOT 0.7 08/12/2023   ALKPHOS 77 08/12/2023   AST 19 08/12/2023   ALT 24 08/12/2023   ANIONGAP 7 01/04/2023   Last lipids Lab Results  Component Value Date   CHOL 123 08/12/2023   HDL 38.80 (L) 08/12/2023   LDLCALC 56 08/12/2023   LDLDIRECT 203.7 03/19/2013   TRIG 139.0 08/12/2023   CHOLHDL 3 08/12/2023      The ASCVD Risk score (Arnett DK, et al., 2019) failed to calculate for the following reasons:   The valid total cholesterol range is 130 to 320 mg/dL    Assessment & Plan:   #1 recent severe hypertension much improved with recent addition of amlodipine  to her lisinopril .  Continue low-sodium diet.  Continue weight control efforts.  Refill amlodipine  for 1 year  #2 coronary artery disease with recent repeat CT morphology study showing no flow-limiting lesions.  Coronary calcium  score 363 which compares with 304 from study in 2021.  Continue aggressive treatment of lipids.  She has good control currently with Repatha   #3 hyperlipidemia.  Recent LDL 56.  Continue low saturated fat diet.  Recent lipids were checked in June  #4 obesity.  Patient currently on GLP-1 with Ozempic  0.25 mg.  Tolerating well we recommend she try to bump this to 0.5 mg subcutaneous once weekly.  Be in touch if she has intolerance at this dosage  Wolm Scarlet, MD

## 2023-09-30 NOTE — Patient Instructions (Signed)
 Try increasing the Ozempic  to 0.5 mg [place] once weekly.

## 2023-10-04 ENCOUNTER — Other Ambulatory Visit: Payer: Self-pay | Admitting: Internal Medicine

## 2023-10-10 ENCOUNTER — Other Ambulatory Visit: Payer: Self-pay | Admitting: Internal Medicine

## 2023-10-14 ENCOUNTER — Other Ambulatory Visit: Payer: Self-pay | Admitting: Family Medicine

## 2023-10-14 MED ORDER — OMEPRAZOLE 40 MG PO CPDR: 40.0000 mg | DELAYED_RELEASE_CAPSULE | Freq: Every day | ORAL | 0 refills | Status: DC

## 2023-10-14 NOTE — Telephone Encounter (Signed)
 Copied from CRM #[id number]. Topic: Clinical - Medication Refill >> Oct 14, 2023 10:47 AM [place] E wrote: Medication: omeprazole  (PRILOSEC) 40 MG capsule  Has the patient contacted their pharmacy? Yes, relayed to patient that the prescribing provider was not her PCP, but she stated how Dr. Micheal has filled this medication for her in the past.  (Agent: If no, request that the patient contact the pharmacy for the refill. If patient does not wish to contact the pharmacy document the reason why and proceed with request.) (Agent: If yes, when and what did the pharmacy advise?)  This is the patient's preferred pharmacy:  [id number] - [place], [place] - [address] [place] [address] Phone: [phone] Fax: [phone]  Is this the correct pharmacy for this prescription? Yes If no, delete pharmacy and type the correct one.   Has the prescription been filled recently? No  Is the patient out of the medication? Yes  Has the patient been seen for an appointment in the last year OR does the patient have an upcoming appointment? Yes  Can we respond through MyChart? Yes  Agent: Please be advised that Rx refills may take up to 3 business days. We ask that you follow-up with your pharmacy.

## 2023-10-24 ENCOUNTER — Ambulatory Visit: Payer: Self-pay

## 2023-10-24 NOTE — Telephone Encounter (Signed)
 FYI Only or Action Required?: FYI only for provider.  Patient was last seen in primary care on 09/30/2023 by Micheal Wolm ORN, MD.  Called Nurse Triage reporting Leg Swelling.  Symptoms began a week ago.  Interventions attempted: Rest, hydration, or home remedies.  Symptoms are: unchanged.  Triage Disposition: See Within 3 Days in Office  Patient/caregiver understands and will follow disposition?: Yes     Copied from CRM #[id number]. Topic: Clinical - Red Word Triage >> Oct 24, 2023  9:49 AM [place] DEL wrote: Red Word that prompted transfer to Nurse Triage: swelling in legs and feet Reason for Disposition  [1] MODERATE leg swelling (e.g., swelling extends up to knees) AND [2] new-onset or getting worse  Answer Assessment - Initial Assessment Questions 1. ONSET: When did the swelling start? (e.g., minutes, hours, days)     X 1 week 2. LOCATION: What part of the leg is swollen?  Are both legs swollen or just one leg?     Bilateral, R>L 3. SEVERITY: How bad is the swelling? (e.g., localized; mild, moderate, severe)     Feet to knees Worsens as the day goes on and worse when wearing flat sandals Endorses swelling is improved upon waking 4. REDNESS: Is there redness or signs of infection?     denies 5. PAIN: Is the swelling painful to touch? If Yes, ask: How painful is it?   (Scale 1-10; mild, moderate or severe)     Endorses tightness when bending at joints 6. FEVER: Do you have a fever? If Yes, ask: What is it, how was it measured, and when did it start?      denies 7. CAUSE: What do you think is causing the leg swelling?     unknown 8. MEDICAL HISTORY: Do you have a history of blood clots (e.g., DVT), cancer, heart failure, kidney disease, or liver failure?     Endorses heart hx - 50% blockage with recent amlodipine  added to med hx 9. RECURRENT SYMPTOM: Have you had leg swelling before? If Yes, ask: When was the last time? What happened that time?      First time 10. OTHER SYMPTOMS: Do you have any other symptoms? (e.g., chest pain, difficulty breathing)       denies 11. PREGNANCY: Is there any chance you are pregnant? When was your last menstrual period?       N/a  Protocols used: Leg Swelling and Edema-A-AH

## 2023-10-28 ENCOUNTER — Ambulatory Visit (INDEPENDENT_AMBULATORY_CARE_PROVIDER_SITE_OTHER): Admitting: Family Medicine

## 2023-10-28 ENCOUNTER — Encounter: Payer: Self-pay | Admitting: Family Medicine

## 2023-10-28 ENCOUNTER — Other Ambulatory Visit (HOSPITAL_COMMUNITY): Payer: Self-pay

## 2023-10-28 VITALS — BP 156/88 | HR 74 | Temp 97.7°F | Wt 174.3 lb

## 2023-10-28 DIAGNOSIS — E669 Obesity, unspecified: Secondary | ICD-10-CM | POA: Diagnosis not present

## 2023-10-28 DIAGNOSIS — R600 Localized edema: Secondary | ICD-10-CM | POA: Diagnosis not present

## 2023-10-28 DIAGNOSIS — I10 Essential (primary) hypertension: Secondary | ICD-10-CM | POA: Diagnosis not present

## 2023-10-28 MED ORDER — WEGOVY 0.25 MG/0.5ML ~~LOC~~ SOAJ: 0.2500 mg | SUBCUTANEOUS | 1 refills | Status: DC

## 2023-10-28 MED ORDER — LISINOPRIL-HYDROCHLOROTHIAZIDE 20-12.5 MG PO TABS: 1.0000 | ORAL_TABLET | Freq: Every day | ORAL | 3 refills | Status: AC

## 2023-10-28 NOTE — Progress Notes (Signed)
 Established Patient Office Visit  Subjective   Patient ID: Debbie Werner, female    DOB: 1956-02-17  Age: 68 y.o. MRN: [id number]  Chief Complaint  Patient presents with   Leg Swelling    HPI   Debbie Werner is seen for medical follow-up with some recent especially late day edema legs feet and ankles.  Perhaps slightly increased shortness of breath.  No orthopnea.  We recently initiated amlodipine  currently at 5 mg daily.  She also takes lisinopril  20 mg daily.  Does take meloxicam  but not daily.  Blood pressure was well-controlled last visit but back up today.  She states she has been compliant with medications.  No history of systolic heart failure by previous echocardiogram.  Recent CT morphology study per cardiology with coronary calcium  score 363 she had mild to moderate coronary stenosis fit with 50% moderate mid right coronary and mild proximal LAD and mild proximal circumflex with no major flow-limiting lesions.  Small PFO.  Recent LDL cholesterol 56.  She remains on Repatha .  Previous intolerance with statins.  Was placed last year on Ozempic  last year and tolerated 0.25 mg dose but had difficulty tolerating 0.5.  She would like to consider going back on GLP-1 again at this time if possible.  She has no history of pancreatitis. No history of diabetes.  Had A1c 5.6% a couple years ago.   Past Medical History:  Diagnosis Date   ALLERGIC RHINITIS 08/03/2008   Basal cell carcinoma    nose   Cancer (HCC)    Skin - has had spots removed for years   CHEST PAIN, ATYPICAL 10/12/2008   CONSTIPATION, CHRONIC 12/08/2009   COVID    DEPRESSION 12/08/2009   GERD 08/03/2008   Gestational diabetes    HEMATOCHEZIA 05/02/2009   HYPERLIPIDEMIA 08/03/2008   HYPERTENSION 08/03/2008   Obesity    SINUSITIS, CHRONIC 02/24/2009   Past Surgical History:  Procedure Laterality Date   BREAST SURGERY     bx   BREATH TEK H PYLORI  12/21/2011   Procedure: BREATH TEK H PYLORI;  Surgeon: Debbie Debbie Lunger, MD;  Location: [facility];  Service: General;  Laterality: N/A;  [facility]   BUNIONECTOMY Left    CYSTECTOMY  1996   ov[place]   METATARSAL OSTEOTOMY Left    NEURECTOMY FOOT Left    TUBAL LIGATION      reports that she has never smoked. She has never used smokeless tobacco. She reports that she does not drink alcohol and does not use drugs. family history includes Heart disease (age of onset: 33) in her father. Allergies  Allergen Reactions   Anesthetics, Amide Nausea Only   Pravastatin Sodium     REACTION: effects brain memory   Sulfadiazine     GI upset    Review of Systems  Constitutional:  Negative for malaise/fatigue.  Eyes:  Negative for blurred vision.  Respiratory:  Negative for shortness of breath.   Cardiovascular:  Positive for leg swelling. Negative for chest pain and orthopnea.  Gastrointestinal:  Negative for abdominal pain.  Neurological:  Negative for dizziness, weakness and headaches.      Objective:     BP (!) 156/88   Pulse 74   Temp 97.7 F (36.5 C) (Oral)   Wt 174 lb 4.8 oz (79.1 kg)   SpO2 96%   BMI 32.93 kg/m  BP Readings from Last 3 Encounters:  10/28/23 (!) 156/88  09/30/23 120/66  09/23/23 (!) 142/89   Wt Readings from Last  3 Encounters:  10/28/23 174 lb 4.8 oz (79.1 kg)  09/30/23 171 lb 14.4 oz (78 kg)  09/10/23 173 lb (78.5 kg)      Physical Exam Vitals reviewed.  Constitutional:      General: She is not in acute distress.    Appearance: She is well-developed. She is not ill-appearing.  Eyes:     Pupils: Pupils are equal, round, and reactive to light.  Neck:     Thyroid : No thyromegaly.     Vascular: No JVD.  Cardiovascular:     Rate and Rhythm: Normal rate and regular rhythm.     Heart sounds:     No gallop.  Pulmonary:     Effort: Pulmonary effort is normal. No respiratory distress.     Breath sounds: Normal breath sounds. No wheezing or rales.  Musculoskeletal:     Cervical back: Neck supple.     Comments:  Trace nonpitting edema lower legs bilaterally  Neurological:     Mental Status: She is alert.      No results found for any visits on 10/28/23.  Last CBC Lab Results  Component Value Date   WBC 9.1 01/04/2023   HGB 14.1 01/04/2023   HCT 42.5 01/04/2023   MCV 88.5 01/04/2023   MCH 29.4 01/04/2023   RDW 13.2 01/04/2023   PLT 346 01/04/2023   Last metabolic panel Lab Results  Component Value Date   GLUCOSE 86 08/12/2023   NA 141 08/12/2023   K 4.3 08/12/2023   CL 107 08/12/2023   CO2 29 08/12/2023   BUN 16 08/12/2023   CREATININE 0.98 08/12/2023   GFR 59.02 (L) 08/12/2023   CALCIUM  9.2 08/12/2023   PROT 6.7 08/12/2023   ALBUMIN 4.0 08/12/2023   BILITOT 0.7 08/12/2023   ALKPHOS 77 08/12/2023   AST 19 08/12/2023   ALT 24 08/12/2023   ANIONGAP 7 01/04/2023   Last lipids Lab Results  Component Value Date   CHOL 123 08/12/2023   HDL 38.80 (L) 08/12/2023   LDLCALC 56 08/12/2023   LDLDIRECT 203.7 03/19/2013   TRIG 139.0 08/12/2023   CHOLHDL 3 08/12/2023      The ASCVD Risk score (Arnett DK, et al., 2019) failed to calculate for the following reasons:   The valid total cholesterol range is 130 to 320 mg/dL    Assessment & Plan:   #1 bilateral leg edema probably related to recent initiation of amlodipine .  Her blood pressure is back up today and see below for further details.  We did discuss possible discontinuation of amlodipine  but she had good initial blood pressure response when initiating this.  We discussed switching her lisinopril  to lisinopril  HCTZ to see if this helps.  Try to avoid regular use of nonsteroidals as much as possible.  Her edema is mild and hopefully addition of HCTZ will be sufficient to address this.  #2 hypertension.  Previously well-controlled but up again today.  Avoid nonsteroidals if possible.  Switching lisinopril  20 mg to lisinopril  HCTZ 20/12.5 mg 1 daily.  Set up follow-up in 1 month to reassess and check basic metabolic panel at  follow-up.  Discussed importance of high potassium diet  #3 obesity with BMI over 32.  She has comorbidities of CAD, hypertension, hyperlipidemia.  Patient requesting going back on GLP-1.  She is not sure if insurance will cover.  We discussed initiating Wegovy  0.25 mg subcutaneous once weekly.  If able to get covered and tolerating after 1 month consider titration to 0.5 mg  although she has had previous difficulties going to higher mg dosage Wolm Scarlet, MD

## 2023-10-28 NOTE — Patient Instructions (Signed)
 Stop the Lisinopril  20 mg daily and changing to Lisinopril  hydrochlorothiazide   Set up one month follow up.

## 2023-11-05 ENCOUNTER — Telehealth: Payer: Self-pay

## 2023-11-05 ENCOUNTER — Other Ambulatory Visit (HOSPITAL_COMMUNITY): Payer: Self-pay

## 2023-11-05 ENCOUNTER — Ambulatory Visit: Payer: Self-pay

## 2023-11-05 NOTE — Telephone Encounter (Signed)
 FYI Only or Action Required?: FYI only for provider.  Patient was last seen in primary care on 10/28/2023 by Micheal Wolm ORN, MD.  Called Nurse Triage reporting Rash and Leg swelling.  Symptoms began a week ago.  Interventions attempted: Nothing.  Symptoms are: lower leg swelling gradually improving, red rash to bilateral feet and ankles.  Triage Disposition: See Physician Within 24 Hours  Patient/caregiver understands and will follow disposition?: Yes              Copied from CRM [id number]. Topic: Clinical - Red Word Triage >> Nov 05, 2023  8:30 AM Debbie Werner wrote: Red Word that prompted transfer to Nurse Triage: feet and ankle swelling and turning to a dark red color after a change in her lisinopril -hydrochlorothiazide  (ZESTORETIC ) 20-12.5 MG tablet [[id number]] prescription. Patient was not able to transfer over to nurse triage at the moment but she would like a call back if possible in an hour once she is back home. Reason for Disposition  [1] Looks infected (e.g., spreading redness, pus) AND [2] no fever  Answer Assessment - Initial Assessment Questions 1. APPEARANCE of RASH: What does the rash look like? (e.g., blisters, dry flaky skin, red spots, redness, sores)     Red, flat rash  2. LOCATION: Where is the rash located?      Both ankles to half way down both feet.  3. NUMBER: How many spots are there?      Large, continuous rash.  4. SIZE: How big are the spots? (e.g., inches, cm; or compare to size of pinhead, tip of pen, eraser, pea)      She states it takes up her entire ankles and half her feet.  5. ONSET: When did the rash start?      X 1 week.  6. ITCHING: Does the rash itch? If Yes, ask: How bad is the itch?  (Scale 0-10; or none, mild, moderate, severe)     No.  7. PAIN: Does the rash hurt? If Yes, ask: How bad is the pain?  (Scale 0-10; or none, mild, moderate, severe)     No.  8. OTHER SYMPTOMS: Do you have any other symptoms?  (e.g., fever)     Bilateral lower leg swelling (ankles down to feet) and has improved slightly, then patient states from her knees down she has some swelling. No fever, chest pain, SOB.  9. PREGNANCY: Is there any chance you are pregnant? When was your last menstrual period?     N/A.  Protocols used: Rash or Redness - Localized-A-AH

## 2023-11-05 NOTE — Telephone Encounter (Signed)
 Pharmacy Patient Advocate Encounter   Received notification from Onbase that prior authorization for Wegovy  0.25 mg/0.5 ml auto injector is required/requested.   Insurance verification completed.   The patient is insured through [facility] ADVANTAGE/RX ADVANCE .   Per test claim: Product not on formulary

## 2023-11-05 NOTE — Telephone Encounter (Signed)
 Noted- will discuss with patient at follow up  Wolm LELON Scarlet MD [facility]

## 2023-11-06 ENCOUNTER — Ambulatory Visit: Admitting: Family Medicine

## 2023-11-06 ENCOUNTER — Other Ambulatory Visit: Payer: Self-pay | Admitting: Family Medicine

## 2023-11-09 ENCOUNTER — Other Ambulatory Visit: Payer: Self-pay | Admitting: Family Medicine

## 2023-11-15 ENCOUNTER — Ambulatory Visit: Admitting: Family Medicine

## 2023-11-18 ENCOUNTER — Telehealth: Payer: Self-pay

## 2023-11-18 MED ORDER — OMEPRAZOLE 40 MG PO CPDR: 40.0000 mg | DELAYED_RELEASE_CAPSULE | Freq: Every day | ORAL | 0 refills | Status: DC

## 2023-11-18 NOTE — Telephone Encounter (Signed)
 Rx done.

## 2023-11-18 NOTE — Telephone Encounter (Signed)
 Copied from CRM #[id number]. Topic: Clinical - Medication Question >> Nov 18, 2023 11:49 AM Anairis L wrote: Reason for CRM: Patient is calling in because [facility] claims Omeprazole  40 mg Oral Daily was not submitted to them. Could we please resubmit medication and make it a 3 month supply.

## 2023-12-12 ENCOUNTER — Other Ambulatory Visit: Payer: Self-pay | Admitting: Family Medicine

## 2023-12-13 ENCOUNTER — Other Ambulatory Visit: Payer: Self-pay | Admitting: Family Medicine

## 2024-01-13 ENCOUNTER — Ambulatory Visit: Admitting: Family Medicine

## 2024-01-13 ENCOUNTER — Ambulatory Visit: Payer: Self-pay | Admitting: Family Medicine

## 2024-01-13 ENCOUNTER — Telehealth: Payer: Self-pay

## 2024-01-13 ENCOUNTER — Encounter: Payer: Self-pay | Admitting: Family Medicine

## 2024-01-13 VITALS — BP 160/90 | HR 85 | Temp 97.8°F | Wt 175.3 lb

## 2024-01-13 DIAGNOSIS — R7989 Other specified abnormal findings of blood chemistry: Secondary | ICD-10-CM

## 2024-01-13 DIAGNOSIS — E669 Obesity, unspecified: Secondary | ICD-10-CM

## 2024-01-13 DIAGNOSIS — I10 Essential (primary) hypertension: Secondary | ICD-10-CM | POA: Diagnosis not present

## 2024-01-13 DIAGNOSIS — R600 Localized edema: Secondary | ICD-10-CM

## 2024-01-13 LAB — BASIC METABOLIC PANEL WITH GFR
BUN: 18 mg/dL (ref 6–23)
CO2: 30 meq/L (ref 19–32)
Calcium: 9.2 mg/dL (ref 8.4–10.5)
Chloride: 107 meq/L (ref 96–112)
Creatinine, Ser: 1.14 mg/dL (ref 0.40–1.20)
GFR: 49.08 mL/min — ABNORMAL LOW (ref >60.00)
Glucose, Bld: 79 mg/dL (ref 70–99)
Potassium: 4.4 meq/L (ref 3.5–5.1)
Sodium: 141 meq/L (ref 135–145)

## 2024-01-13 LAB — T4, FREE: Free T4: 0.66 ng/dL (ref 0.60–1.60)

## 2024-01-13 LAB — TSH: TSH: 4.41 u[IU]/mL (ref 0.35–5.50)

## 2024-01-13 MED ORDER — TIRZEPATIDE-WEIGHT MANAGEMENT 2.5 MG/0.5ML ~~LOC~~ SOLN: 2.5000 mg | SUBCUTANEOUS | 1 refills | Status: AC

## 2024-01-13 NOTE — Progress Notes (Signed)
 Established Patient Office Visit  Subjective   Patient ID: Debbie Werner, female    DOB: [birth year]  Age: 68 y.o. MRN: [id number]  Chief Complaint  Patient presents with   Medical Management of Chronic Issues    HPI   Houa has a history of CAD, hypertension, GERD, chronic insomnia, hyperlipidemia, obesity.  She had some mild edema last visit and we had switched her from lisinopril  to lisinopril  HCTZ.  However, she is not sure if she actually got new prescription for the combination drug.  She thinks she may be still on plain lisinopril .  Also taking low-dose amlodipine .  Home blood pressures have consistently been better than 1 here today.  She recalls recent home blood pressure 112/70.  Her weight is only minimally changed from last visit.  She has some mild edema bilaterally.  Does take meloxicam  fairly frequently.  No known history of obstructive sleep apnea.  No observed apnea episodes.  She has had some elevated TSH mildly in the past with probable subclinical hypothyroidism.  Struggled with weight loss for years.  Took Ozempic  for some time but apparently did not see much improvement with her weight on higher doses of Ozempic .  She specifically has questions about Mounjaro  or Zepbound .  She has tried multiple things for weight loss in the past unsuccessfully including weight watchers, phentermine , low carbohydrate/Atkins type diet, and regular exercise.  Past Medical History:  Diagnosis Date   ALLERGIC RHINITIS 08/03/2008   Basal cell carcinoma    nose   Cancer (HCC)    Skin - has had spots removed for years   CHEST PAIN, ATYPICAL 10/12/2008   CONSTIPATION, CHRONIC 12/08/2009   COVID    DEPRESSION 12/08/2009   GERD 08/03/2008   Gestational diabetes    HEMATOCHEZIA 05/02/2009   HYPERLIPIDEMIA 08/03/2008   HYPERTENSION 08/03/2008   Obesity    SINUSITIS, CHRONIC 02/24/2009   Past Surgical History:  Procedure Laterality Date   BREAST SURGERY     bx   BREATH TEK H  PYLORI  12/21/2011   Procedure: BREATH TEK H PYLORI;  Surgeon: Donnice KATHEE Lunger, MD;  Location: [facility];  Service: General;  Laterality: N/A;  Katy/Andrea   BUNIONECTOMY Left    CYSTECTOMY  1996   ov[place]   METATARSAL OSTEOTOMY Left    NEURECTOMY FOOT Left    TUBAL LIGATION      reports that she has never smoked. She has never used smokeless tobacco. She reports that she does not drink alcohol and does not use drugs. family history includes Heart disease (age of onset: 53) in her father. Allergies  Allergen Reactions   Anesthetics, Amide Nausea Only   Pravastatin Sodium     REACTION: effects brain memory   Sulfadiazine     GI upset    Review of Systems  Constitutional:  Negative for malaise/fatigue.  Eyes:  Negative for blurred vision.  Respiratory:  Negative for shortness of breath.   Cardiovascular:  Positive for leg swelling. Negative for chest pain and orthopnea.  Neurological:  Negative for dizziness, weakness and headaches.      Objective:     BP (!) 160/90   Pulse 85   Temp 97.8 F (36.6 C) (Oral)   Wt 175 lb 4.8 oz (79.5 kg)   SpO2 95%   BMI 33.12 kg/m  BP Readings from Last 3 Encounters:  01/13/24 (!) 160/90  10/28/23 (!) 156/88  09/30/23 120/66   Wt Readings from Last 3 Encounters:  01/13/24 175  lb 4.8 oz (79.5 kg)  10/28/23 174 lb 4.8 oz (79.1 kg)  09/30/23 171 lb 14.4 oz (78 kg)      Physical Exam Vitals reviewed.  Constitutional:      General: She is not in acute distress.    Appearance: She is well-developed. She is not ill-appearing.  Eyes:     Pupils: Pupils are equal, round, and reactive to light.  Neck:     Thyroid : No thyromegaly.     Vascular: No JVD.  Cardiovascular:     Rate and Rhythm: Normal rate and regular rhythm.     Heart sounds:     No gallop.  Pulmonary:     Effort: Pulmonary effort is normal. No respiratory distress.     Breath sounds: Normal breath sounds. No wheezing or rales.  Musculoskeletal:     Cervical back:  Neck supple.     Comments: Does not have any significant pitting edema at this time.  Neurological:     Mental Status: She is alert.      No results found for any visits on 01/13/24.    The ASCVD Risk score (Arnett DK, et al., 2019) failed to calculate for the following reasons:   The valid total cholesterol range is 130 to 320 mg/dL    Assessment & Plan:   #1 history of elevated TSH.  She has had some recent fatigue issues and mild edema as above.  Recheck TSH and free T4.  Consider initiating thyroid  replacement if TSH above normal range  #2 hypertension.  Readings up here today but improved at home.  Confirm if she is taking lisinopril  HCTZ.  If still on plain lisinopril  we will transition that to lisinopril  HCTZ.  #3 obesity.  Patient specifically inquiring about possible GLP-1 use.  Tried Ozempic  previously but did not see great success.  She would like to consider Zepbound .  She has no contraindications.  She is interested in [employer] program.  Will send in Zepbound  2.5 mg [place] once weekly and feedback in one month if tolerating.    Return in about 2 months (around 03/14/2024).    Wolm Scarlet, MD

## 2024-01-13 NOTE — Patient Instructions (Signed)
 Confirm that you are on the Lisinopril -hydrochlorothiazide  rather than plain Lisinopril .   We may want to consider home sleep study at some point  I will try sending in the Zepbound  for weight.

## 2024-01-13 NOTE — Telephone Encounter (Signed)
 Copied from CRM [id number]. Topic: Clinical - Medication Question >> Jan 13, 2024 12:21 PM Shereese L wrote: Reason for CRM: patient called in and stated that script only says lisinopril  20mg  and not the hydrochlorothiazide  and filled on 10/08

## 2024-01-13 NOTE — Telephone Encounter (Signed)
 I spoke with the patient and she reported her pharmacy filled her lisinopril  20 mg instead of Lisinopril  hydrochlorothiazide . Patient informed to reach out to her pharmacy to advise them of this error and to have correct medication refilled.

## 2024-01-27 ENCOUNTER — Ambulatory Visit: Admitting: Physician Assistant

## 2024-01-27 ENCOUNTER — Other Ambulatory Visit: Payer: Self-pay

## 2024-01-27 DIAGNOSIS — M25561 Pain in right knee: Secondary | ICD-10-CM

## 2024-01-27 MED ORDER — LIDOCAINE HCL 1 % IJ SOLN
3.0000 mL | INTRAMUSCULAR | Status: AC | PRN
  Administered 2024-01-27: 3 mL

## 2024-01-27 MED ORDER — METHYLPREDNISOLONE ACETATE 40 MG/ML IJ SUSP
40.0000 mg | INTRAMUSCULAR | Status: AC | PRN
  Administered 2024-01-27: 40 mg via INTRA_ARTICULAR

## 2024-01-27 NOTE — Progress Notes (Signed)
 Office Visit Note   Patient: Debbie Werner           Date of Birth: Feb 24, 1955           MRN: [id number] Visit Date: 01/27/2024              Requested by: Micheal Wolm LELON, MD [address] [place],  [place] [address] PCP: Micheal Wolm LELON, MD   Assessment & Plan: Visit Diagnoses:  1. Acute pain of right knee     Plan:  Discussed with her quad strengthening exercises and reviewed these with her.  Offered her cortisone injection she was agreeable.  Also recommend she use Voltaren  gel which she has used in the past on her elbow.  She will use 4 g 4 times a day to the knee.  Will see her back in 8 weeks see how she is doing overall.  Questions were encouraged and answered  Follow-Up Instructions: Return in about 8 weeks (around 03/23/2024).   Orders:  Orders Placed This Encounter  Procedures   XR Knee 1-2 Views Right   No orders of the defined types were placed in this encounter.     Procedures: Large Joint Inj: R knee on 01/27/2024 4:15 PM Indications: pain Details: 22 G 1.5 in needle, anterolateral approach  Arthrogram: No  Medications: 3 mL lidocaine  1 %; 40 mg methylPREDNISolone  acetate 40 MG/ML Outcome: tolerated well, no immediate complications Procedure, treatment alternatives, risks and benefits explained, specific risks discussed. Consent was given by the patient. Immediately prior to procedure a time out was called to verify the correct patient, procedure, equipment, support staff and site/side marked as required. Patient was prepped and draped in the usual sterile fashion.       Clinical Data: No additional findings.   Subjective: Chief Complaint  Patient presents with   Right Knee - Pain    HPI This is a Weil 68 year old female comes in today with right knee pain and swelling.  The pains been ongoing for at least the last 2 weeks.  Ranks her pain to be 8-9 out of 10 pain at worst.  No mechanical symptoms.  She has had no injuries.  She  has had no treatments.  She has taken some Tylenol  and meloxicam  but no real relief.  She does note years ago she bent down there was an audible white tearing sound in the knee.  Otherwise no injuries in the past.  She has been going up and down a lot of stairs particularly to get her Christmas decorations and feels like the knee could give way but it has not given way.  Pain is worse with going downstairs   Review of Systems  Constitutional:  Negative for chills and fever.     Objective: Vital Signs: There were no vitals taken for this visit.  Physical Exam Constitutional:      Appearance: She is not ill-appearing or diaphoretic.  Pulmonary:     Effort: Pulmonary effort is normal.  Neurological:     Mental Status: She is oriented to person, place, and time.  Psychiatric:        Mood and Affect: Mood normal.     Ortho Exam Bilateral knees: Good range of motion of both knees.  No abnormal warmth erythema or effusion of either knee.  Right knee tenderness medial joint line.  Significant patellofemoral crepitus both knees.  No gross instability valgus varus stressing of either knee.  Specialty Comments:  No specialty comments available.  Imaging: XR Knee 1-2 Views Right Result Date: 01/27/2024 Right knee 2 views: Knee is well located.  No acute fracture.  Moderate medial compartmental narrowing.  Mild-moderate patellofemoral changes.  No other bony abnormalities.    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   Chronic insomnia 05/12/2015   Obesity (BMI 30-39.9) 12/04/2012   Inflamed external hemorrhoid 09/18/2011   DEPRESSION 12/08/2009   CONSTIPATION, CHRONIC 12/08/2009   HEMATOCHEZIA 05/02/2009   SINUSITIS, CHRONIC 02/24/2009   Chest pain at rest 10/12/2008    Hyperlipidemia 08/03/2008   Essential hypertension 08/03/2008   ALLERGIC RHINITIS 08/03/2008   GERD 08/03/2008   Past Medical History:  Diagnosis Date   ALLERGIC RHINITIS 08/03/2008   Basal cell carcinoma    nose   Cancer (HCC)    Skin - has had spots removed for years   CHEST PAIN, ATYPICAL 10/12/2008   CONSTIPATION, CHRONIC 12/08/2009   COVID    DEPRESSION 12/08/2009   GERD 08/03/2008   Gestational diabetes    HEMATOCHEZIA 05/02/2009   HYPERLIPIDEMIA 08/03/2008   HYPERTENSION 08/03/2008   Obesity    SINUSITIS, CHRONIC 02/24/2009    Family History  Problem Relation Age of Onset   Heart disease Father 3       CABG   Hyperlipidemia Neg Hx        parent, grandparent   Hypertension Neg Hx        parent , grandparent   Stroke Neg Hx        grandparent   Diabetes Neg Hx        grandparent    Past Surgical History:  Procedure Laterality Date   BREAST SURGERY     bx   BREATH TEK H PYLORI  12/21/2011   Procedure: BREATH TEK H PYLORI;  Surgeon: Donnice KATHEE Lunger, MD;  Location: [facility];  Service: General;  Laterality: N/A;  Katy/Andrea   BUNIONECTOMY Left    CYSTECTOMY  1996   ov[place]   METATARSAL OSTEOTOMY Left    NEURECTOMY FOOT Left    TUBAL LIGATION     Social History   Occupational History   Not on file  Tobacco Use   Smoking status: Never   Smokeless tobacco: Never  Vaping Use   Vaping status: Never Used  Substance and Sexual Activity   Alcohol use: No    Alcohol/week: 0.0 standard drinks of alcohol   Drug use: No   Sexual activity: Not on file

## 2024-02-05 ENCOUNTER — Other Ambulatory Visit: Payer: Self-pay | Admitting: Family Medicine

## 2024-02-05 ENCOUNTER — Telehealth: Payer: Self-pay

## 2024-02-05 NOTE — Telephone Encounter (Unsigned)
 Copied from CRM [id number]. Topic: Clinical - Medication Question >> Feb 05, 2024  3:12 PM Jayma L wrote: Reason for CRM: patient called in and stated she needed a bigger dose of her zepbound  , said she's not noticing any changes, best number to reach her is [phone]

## 2024-02-06 NOTE — Telephone Encounter (Signed)
 Patient informed of the message below and stated she is already taking 5mg  and was told a new Rx would need to be sent for an increased dose.  Message sent to PCP.

## 2024-02-10 MED ORDER — TIRZEPATIDE-WEIGHT MANAGEMENT 7.5 MG/0.5ML ~~LOC~~ SOLN: 7.5000 mg | SUBCUTANEOUS | 0 refills | Status: DC

## 2024-02-10 NOTE — Addendum Note (Signed)
 Addended by: CHRISTYNE IDELL LABOR on: 02/10/2024 10:29 AM   Modules accepted: Orders

## 2024-02-10 NOTE — Telephone Encounter (Signed)
 Message sent to PCP for signature as Rx printed and would not submit electronically to [employer].

## 2024-02-17 ENCOUNTER — Telehealth: Payer: Self-pay

## 2024-02-17 LAB — HM MAMMOGRAPHY

## 2024-02-17 NOTE — Telephone Encounter (Signed)
 Copied from CRM [id number]. Topic: Clinical - Prescription Issue >> Feb 17, 2024  2:25 PM Eva FALCON wrote: Reason for CRM: Pt states that [facility] has not received the increased dosage script for the tirzepatide . Pt was wondering if this could be sent again and when it is please give her a call [phone].

## 2024-02-17 NOTE — Telephone Encounter (Signed)
"  Rx sent  "

## 2024-02-21 NOTE — Progress Notes (Signed)
 Debbie Werner                                          MRN: [id number]   02/21/2024   The [facility] Quality Team Specialist reviewed this patient medical record for the purposes of chart review for care gap closure. The following were reviewed: chart review for care gap closure-controlling blood pressure.    [facility] Quality Team

## 2024-02-27 ENCOUNTER — Ambulatory Visit: Admitting: Adult Health

## 2024-03-06 LAB — HM MAMMOGRAPHY

## 2024-03-09 ENCOUNTER — Encounter: Payer: Self-pay | Admitting: Family Medicine

## 2024-03-18 ENCOUNTER — Other Ambulatory Visit: Payer: Self-pay | Admitting: Family Medicine

## 2024-03-18 NOTE — Telephone Encounter (Unsigned)
 Copied from CRM [id number]. Topic: Clinical - Medication Refill >> Mar 18, 2024  2:46 PM Carlyon D wrote: Medication: tirzepatide  7.5 MG/0.5ML injection vial  Has the patient contacted their pharmacy? Yes (Agent: If no, request that the patient contact the pharmacy for the refill. If patient does not wish to contact the pharmacy document the reason why and proceed with request.) (Agent: If yes, when and what did the pharmacy advise?)  This is the patient's preferred pharmacy:    [facility] [place], [place] - [id number] [place] [address] Phone: [phone] Fax: [phone]  Is this the correct pharmacy for this prescription? Yes If no, delete pharmacy and type the correct one.   Has the prescription been filled recently? Yes  Is the patient out of the medication? Yes  Has the patient been seen for an appointment in the last year OR does the patient have an upcoming appointment? Yes  Can we respond through MyChart? No prefers phone call please   Agent: Please be advised that Rx refills may take up to 3 business days. We ask that you follow-up with your pharmacy.

## 2024-03-19 MED ORDER — TIRZEPATIDE-WEIGHT MANAGEMENT 7.5 MG/0.5ML ~~LOC~~ SOLN: 7.5000 mg | SUBCUTANEOUS | 0 refills | Status: AC

## 2024-03-23 ENCOUNTER — Ambulatory Visit: Admitting: Physician Assistant

## 2024-04-02 ENCOUNTER — Ambulatory Visit: Admitting: Physician Assistant
